# Patient Record
Sex: Male | Born: 1950 | Race: White | Hispanic: No | Marital: Married | Smoking: Never smoker
Health system: Southern US, Community
[De-identification: ages and names within clinical notes are randomized; demographics above are authoritative.]

## PROBLEM LIST (undated history)

## (undated) DIAGNOSIS — R188 Other ascites: Secondary | ICD-10-CM

## (undated) DIAGNOSIS — K922 Gastrointestinal hemorrhage, unspecified: Secondary | ICD-10-CM

## (undated) DIAGNOSIS — K7460 Unspecified cirrhosis of liver: Secondary | ICD-10-CM

## (undated) DIAGNOSIS — N179 Acute kidney failure, unspecified: Secondary | ICD-10-CM

## (undated) DIAGNOSIS — I509 Heart failure, unspecified: Secondary | ICD-10-CM

## (undated) DIAGNOSIS — K219 Gastro-esophageal reflux disease without esophagitis: Secondary | ICD-10-CM

## (undated) DIAGNOSIS — D696 Thrombocytopenia, unspecified: Secondary | ICD-10-CM

## (undated) DIAGNOSIS — E119 Type 2 diabetes mellitus without complications: Secondary | ICD-10-CM

## (undated) DIAGNOSIS — D649 Anemia, unspecified: Secondary | ICD-10-CM

## (undated) DIAGNOSIS — K766 Portal hypertension: Secondary | ICD-10-CM

## (undated) HISTORY — PX: CARDIAC CATHETERIZATION: SHX172

## (undated) HISTORY — DX: Heart failure, unspecified: I50.9

---

## 2012-09-20 ENCOUNTER — Emergency Department: Payer: Self-pay | Admitting: Emergency Medicine

## 2013-02-19 DIAGNOSIS — D649 Anemia, unspecified: Secondary | ICD-10-CM

## 2013-02-19 DIAGNOSIS — K922 Gastrointestinal hemorrhage, unspecified: Secondary | ICD-10-CM

## 2013-02-19 HISTORY — DX: Anemia, unspecified: D64.9

## 2013-02-19 HISTORY — DX: Gastrointestinal hemorrhage, unspecified: K92.2

## 2013-03-09 ENCOUNTER — Emergency Department (HOSPITAL_COMMUNITY): Payer: BC Managed Care – PPO

## 2013-03-09 ENCOUNTER — Inpatient Hospital Stay (HOSPITAL_COMMUNITY)
Admission: EM | Admit: 2013-03-09 | Discharge: 2013-03-13 | DRG: 432 | Disposition: A | Discharge status: Home Health | Payer: BC Managed Care – PPO | Attending: Internal Medicine | Admitting: Internal Medicine

## 2013-03-09 ENCOUNTER — Encounter (HOSPITAL_COMMUNITY): Payer: Self-pay | Admitting: Emergency Medicine

## 2013-03-09 DIAGNOSIS — E46 Unspecified protein-calorie malnutrition: Secondary | ICD-10-CM | POA: Diagnosis present

## 2013-03-09 DIAGNOSIS — D6959 Other secondary thrombocytopenia: Secondary | ICD-10-CM | POA: Diagnosis present

## 2013-03-09 DIAGNOSIS — K319 Disease of stomach and duodenum, unspecified: Secondary | ICD-10-CM | POA: Diagnosis present

## 2013-03-09 DIAGNOSIS — I8501 Esophageal varices with bleeding: Secondary | ICD-10-CM | POA: Diagnosis present

## 2013-03-09 DIAGNOSIS — K7460 Unspecified cirrhosis of liver: Secondary | ICD-10-CM | POA: Diagnosis present

## 2013-03-09 DIAGNOSIS — E872 Acidosis, unspecified: Secondary | ICD-10-CM | POA: Diagnosis present

## 2013-03-09 DIAGNOSIS — D689 Coagulation defect, unspecified: Secondary | ICD-10-CM | POA: Diagnosis present

## 2013-03-09 DIAGNOSIS — R7401 Elevation of levels of liver transaminase levels: Secondary | ICD-10-CM | POA: Diagnosis present

## 2013-03-09 DIAGNOSIS — D62 Acute posthemorrhagic anemia: Secondary | ICD-10-CM | POA: Diagnosis present

## 2013-03-09 DIAGNOSIS — I8511 Secondary esophageal varices with bleeding: Secondary | ICD-10-CM | POA: Diagnosis present

## 2013-03-09 DIAGNOSIS — D696 Thrombocytopenia, unspecified: Secondary | ICD-10-CM | POA: Diagnosis present

## 2013-03-09 DIAGNOSIS — Z87891 Personal history of nicotine dependence: Secondary | ICD-10-CM

## 2013-03-09 DIAGNOSIS — E119 Type 2 diabetes mellitus without complications: Secondary | ICD-10-CM | POA: Diagnosis present

## 2013-03-09 DIAGNOSIS — K922 Gastrointestinal hemorrhage, unspecified: Secondary | ICD-10-CM | POA: Diagnosis present

## 2013-03-09 DIAGNOSIS — Z79899 Other long term (current) drug therapy: Secondary | ICD-10-CM

## 2013-03-09 LAB — URINALYSIS, ROUTINE W REFLEX MICROSCOPIC
Hgb urine dipstick: NEGATIVE
Leukocytes, UA: NEGATIVE
Protein, ur: NEGATIVE mg/dL
Urobilinogen, UA: 0.2 mg/dL (ref 0.0–1.0)

## 2013-03-09 LAB — POCT I-STAT, CHEM 8
BUN: 37 mg/dL — ABNORMAL HIGH (ref 6–23)
Chloride: 108 mEq/L (ref 96–112)
Glucose, Bld: 200 mg/dL — ABNORMAL HIGH (ref 70–99)
HCT: 31 % — ABNORMAL LOW (ref 39.0–52.0)
Potassium: 5.3 mEq/L — ABNORMAL HIGH (ref 3.5–5.1)
Sodium: 141 mEq/L (ref 135–145)

## 2013-03-09 LAB — CBC WITH DIFFERENTIAL/PLATELET
Basophils Absolute: 0 10*3/uL (ref 0.0–0.1)
Basophils Relative: 0 % (ref 0–1)
Eosinophils Absolute: 0 10*3/uL (ref 0.0–0.7)
Eosinophils Relative: 0 % (ref 0–5)
HCT: 29.6 % — ABNORMAL LOW (ref 39.0–52.0)
Hemoglobin: 9.8 g/dL — ABNORMAL LOW (ref 13.0–17.0)
MCH: 32.7 pg (ref 26.0–34.0)
MCHC: 33.1 g/dL (ref 30.0–36.0)
MCV: 98.7 fL (ref 78.0–100.0)
Monocytes Absolute: 1.7 10*3/uL — ABNORMAL HIGH (ref 0.1–1.0)
Monocytes Relative: 13 % — ABNORMAL HIGH (ref 3–12)
Neutro Abs: 10.1 10*3/uL — ABNORMAL HIGH (ref 1.7–7.7)
Neutrophils Relative %: 79 % — ABNORMAL HIGH (ref 43–77)
RDW: 13.6 % (ref 11.5–15.5)

## 2013-03-09 LAB — OCCULT BLOOD, POC DEVICE: Fecal Occult Bld: POSITIVE — AB

## 2013-03-09 LAB — HEPATIC FUNCTION PANEL
ALT: 69 U/L — ABNORMAL HIGH (ref 0–53)
Albumin: 3 g/dL — ABNORMAL LOW (ref 3.5–5.2)
Indirect Bilirubin: 1.3 mg/dL — ABNORMAL HIGH (ref 0.3–0.9)
Total Protein: 6.3 g/dL (ref 6.0–8.3)

## 2013-03-09 LAB — PROTIME-INR: INR: 1.61 — ABNORMAL HIGH (ref 0.00–1.49)

## 2013-03-09 LAB — CG4 I-STAT (LACTIC ACID): Lactic Acid, Venous: 5.01 mmol/L — ABNORMAL HIGH (ref 0.5–2.2)

## 2013-03-09 MED ORDER — PANTOPRAZOLE SODIUM 40 MG IV SOLR
40.0000 mg | Freq: Once | INTRAVENOUS | Status: AC
  Administered 2013-03-09: 40 mg via INTRAVENOUS
  Filled 2013-03-09: qty 40

## 2013-03-09 MED ORDER — IOHEXOL 300 MG/ML  SOLN: 100.0000 mL | Freq: Once | INTRAMUSCULAR | Status: AC | PRN

## 2013-03-09 MED ORDER — PIPERACILLIN-TAZOBACTAM 3.375 G IVPB
3.3750 g | Freq: Once | INTRAVENOUS | Status: AC
  Administered 2013-03-09: 3.375 g via INTRAVENOUS
  Filled 2013-03-09: qty 50

## 2013-03-09 MED ORDER — FENTANYL CITRATE 0.05 MG/ML IJ SOLN
50.0000 ug | Freq: Once | INTRAMUSCULAR | Status: AC
  Administered 2013-03-09: 50 ug via INTRAVENOUS
  Filled 2013-03-09: qty 2

## 2013-03-09 MED ORDER — SODIUM CHLORIDE 0.9 % IV BOLUS (SEPSIS)
1000.0000 mL | Freq: Once | INTRAVENOUS | Status: AC
  Administered 2013-03-09: 1000 mL via INTRAVENOUS

## 2013-03-09 MED ORDER — IOHEXOL 300 MG/ML  SOLN
25.0000 mL | INTRAMUSCULAR | Status: AC
  Administered 2013-03-09: 25 mL via ORAL

## 2013-03-09 MED ORDER — SODIUM CHLORIDE 0.9 % IV BOLUS (SEPSIS): 1000.0000 mL | Freq: Once | INTRAVENOUS | Status: DC

## 2013-03-09 MED ORDER — FENTANYL CITRATE 0.05 MG/ML IJ SOLN
50.0000 ug | Freq: Once | INTRAMUSCULAR | Status: AC
  Administered 2013-03-10: 50 ug via INTRAVENOUS
  Filled 2013-03-09: qty 2

## 2013-03-09 MED ORDER — ONDANSETRON HCL 4 MG/2ML IJ SOLN
4.0000 mg | Freq: Once | INTRAMUSCULAR | Status: AC
  Administered 2013-03-09: 4 mg via INTRAVENOUS
  Filled 2013-03-09: qty 2

## 2013-03-09 NOTE — ED Notes (Signed)
Pt arrived to ED, started complaining about lower abdominal pain above pelvis. Pt states this is the first time he has had pain in his abdomen. Pt states that he had 2 episodes of dark stools this morning prior to work. Pt was not having any pain nor had he had dark stools prior to this morning. Pt states that he felt fine at work all day and this afternoon had one episode of bright red vomit pt still denied pain prior to of after vomiting. Pt denies passing out or LOC after vomiting. Pt arrived with bright red vomit on chin from earlier.

## 2013-03-09 NOTE — ED Notes (Signed)
Zosyn started after blood cultures were drawn. Phlebotomy did not click off until after medication charted.

## 2013-03-09 NOTE — ED Provider Notes (Signed)
CSN: [id number]     Arrival date & time 03/09/13  1908 History   First MD Initiated Contact with Patient 03/09/13 1910     Chief Complaint  Patient presents with  . GI Bleeding   (Consider location/radiation/quality/duration/timing/severity/associated sxs/prior Treatment) HPI Onset was this morning for black stools and just prior to arrival for blood in vomit. 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 Pt does not remember what happened.  The pain is moderately severe located to abdomen diffusely. Sharp, no radiation. Modifying factors: pain worse with palpation vomiting.  Associated symptoms: nause.  Recent medical care: none.   Past Medical History  Diagnosis Date  . Diabetes mellitus without complication    Past Surgical History  Procedure Laterality Date  . Esophagogastroduodenoscopy N/A 03/10/2013    Procedure: ESOPHAGOGASTRODUODENOSCOPY (EGD);  Surgeon: Rachael Fee, MD;  Location: [facility] ENDOSCOPY;  Service: Endoscopy;  Laterality: N/A;   History reviewed. No pertinent family history. History  Substance Use Topics  . Smoking status: Former Games developer  . Smokeless tobacco: Not on file  . Alcohol Use: No    Review of Systems Constitutional: Negative for fever.  Eyes: Negative for vision loss.  ENT: Negative for difficulty swallowing.  Cardiovascular: Negative for chest pain. Respiratory: Negative for respiratory distress.  Gastrointestinal:  Positive for vomiting.  Genitourinary: Negative for inability to void.  Musculoskeletal: Negative for gait problem.  Integumentary: Negative for rash.  Neurological: Negative for new focal weakness.     Allergies  Review of patient's allergies indicates no known allergies.  Home Medications  No current outpatient prescriptions on file. BP 110/64  Pulse 87  Temp(Src) 99.5 F (37.5 C) (Oral)  Resp 22  Ht 5\' 6"  (1.676 m)  Wt 182 lb 15.7 oz (83.001 kg)  BMI 29.55 kg/m2   SpO2 96% Physical Exam Nursing note and vitals reviewed.  Constitutional: Pt is alert and appears stated age. Eyes: No injection, no scleral icterus. HENT: Atraumatic, airway open without erythema or exudate.  Respiratory: No respiratory distress. Equal breathing bilaterally. Cardiovascular: Normal rate. Extremities warm and well perfused.  Abdomen: Soft, slight distention, diffuse tenderness, mild guarding, no rebound. MSK: Extremities are atraumatic without deformity. Skin: No rash, no wounds.   Neuro: No motor nor sensory deficit. GCS 15.  GU: Normal rectum, black stool.      ED Course  Procedures (including critical care time) Labs Review Labs Reviewed  CBC WITH DIFFERENTIAL - Abnormal; Notable for the following:    WBC 12.8 (*)    RBC 3.00 (*)    Hemoglobin 9.8 (*)    HCT 29.6 (*)    Platelets 131 (*)    Neutrophils Relative % 79 (*)    Neutro Abs 10.1 (*)    Lymphocytes Relative 8 (*)    Monocytes Relative 13 (*)    Monocytes Absolute 1.7 (*)    All other components within normal limits  PROTIME-INR - Abnormal; Notable for the following:    Prothrombin Time 18.7 (*)    INR 1.61 (*)    All other components within normal limits  HEPATIC FUNCTION PANEL - Abnormal; Notable for the following:    Albumin 3.0 (*)    AST 65 (*)    ALT 69 (*)    Total Bilirubin 1.8 (*)    Bilirubin, Direct 0.5 (*)    Indirect Bilirubin 1.3 (*)    All other components within normal limits  URINALYSIS, ROUTINE  W REFLEX MICROSCOPIC - Abnormal; Notable for the following:    APPearance CLOUDY (*)    Glucose, UA 250 (*)    Ketones, ur 15 (*)    All other components within normal limits  CBC WITH DIFFERENTIAL - Abnormal; Notable for the following:    WBC 11.6 (*)    RBC 2.47 (*)    Hemoglobin 8.2 (*)    HCT 23.8 (*)    Platelets 107 (*)    Neutro Abs 8.1 (*)    Monocytes Relative 13 (*)    Monocytes Absolute 1.5 (*)    All other components within normal limits  GLUCOSE, CAPILLARY -  Abnormal; Notable for the following:    Glucose-Capillary 181 (*)    All other components within normal limits  CBC - Abnormal; Notable for the following:    RBC 2.55 (*)    Hemoglobin 8.4 (*)    HCT 24.7 (*)    Platelets 103 (*)    All other components within normal limits  COMPREHENSIVE METABOLIC PANEL - Abnormal; Notable for the following:    Glucose, Bld 207 (*)    BUN 31 (*)    Total Protein 5.8 (*)    Albumin 2.7 (*)    AST 147 (*)    ALT 151 (*)    All other components within normal limits  PROTIME-INR - Abnormal; Notable for the following:    Prothrombin Time 18.2 (*)    INR 1.55 (*)    All other components within normal limits  GLUCOSE, CAPILLARY - Abnormal; Notable for the following:    Glucose-Capillary 183 (*)    All other components within normal limits  HEMOGLOBIN AND HEMATOCRIT, BLOOD - Abnormal; Notable for the following:    Hemoglobin 9.7 (*)    HCT 27.8 (*)    All other components within normal limits  GLUCOSE, CAPILLARY - Abnormal; Notable for the following:    Glucose-Capillary 214 (*)    All other components within normal limits  HEMOGLOBIN AND HEMATOCRIT, BLOOD - Abnormal; Notable for the following:    Hemoglobin 9.7 (*)    HCT 28.0 (*)    All other components within normal limits  COMPREHENSIVE METABOLIC PANEL - Abnormal; Notable for the following:    Glucose, Bld 215 (*)    Total Protein 5.8 (*)    Albumin 2.9 (*)    AST 179 (*)    ALT 219 (*)    All other components within normal limits  PROTIME-INR - Abnormal; Notable for the following:    Prothrombin Time 17.9 (*)    INR 1.52 (*)    All other components within normal limits  CBC - Abnormal; Notable for the following:    RBC 2.98 (*)    Hemoglobin 9.8 (*)    HCT 27.7 (*)    Platelets 66 (*)    All other components within normal limits  GLUCOSE, CAPILLARY - Abnormal; Notable for the following:    Glucose-Capillary 225 (*)    All other components within normal limits  GLUCOSE, CAPILLARY -  Abnormal; Notable for the following:    Glucose-Capillary 197 (*)    All other components within normal limits  GLUCOSE, CAPILLARY - Abnormal; Notable for the following:    Glucose-Capillary 205 (*)    All other components within normal limits  GLUCOSE, CAPILLARY - Abnormal; Notable for the following:    Glucose-Capillary 193 (*)    All other components within normal limits  GLUCOSE, CAPILLARY - Abnormal; Notable for the  following:    Glucose-Capillary 210 (*)    All other components within normal limits  OCCULT BLOOD, POC DEVICE - Abnormal; Notable for the following:    Fecal Occult Bld POSITIVE (*)    All other components within normal limits  POCT I-STAT, CHEM 8 - Abnormal; Notable for the following:    Potassium 5.3 (*)    BUN 37 (*)    Glucose, Bld 200 (*)    Hemoglobin 10.5 (*)    HCT 31.0 (*)    All other components within normal limits  CG4 I-STAT (LACTIC ACID) - Abnormal; Notable for the following:    Lactic Acid, Venous 5.01 (*)    All other components within normal limits  CG4 I-STAT (LACTIC ACID) - Abnormal; Notable for the following:    Lactic Acid, Venous 3.53 (*)    All other components within normal limits  CULTURE, BLOOD (ROUTINE X 2)  CULTURE, BLOOD (ROUTINE X 2)  URINE CULTURE  MRSA PCR SCREENING  HEPATITIS PANEL, ACUTE  AFP TUMOR MARKER  AMMONIA  CBC  LIPID PANEL  TYPE AND SCREEN  ABO/RH  PREPARE RBC (CROSSMATCH)   Imaging Review Ct Abdomen Pelvis W Contrast  03/09/2013   CLINICAL DATA:  Gastrointestinal bleeding.  Diabetes.  EXAM: CT ABDOMEN AND PELVIS WITH CONTRAST  TECHNIQUE: Multidetector CT imaging of the abdomen and pelvis was performed using the standard protocol following bolus administration of intravenous contrast.  CONTRAST:  100 cc Omnipaque 300  COMPARISON:  None.  FINDINGS: Is distal esophageal contrast medium suggest gastroesophageal reflux. Borderline distal esophageal wall thickening.  Numerous small solid-appearing lesions are  scattered throughout the liver. Prominently nodular hepatic contour compatible with cirrhosis. Upper normal spleen size. Portal vein and splenic vein patent. Scattered small varices along the gastrohepatic ligament and tracking adjacent to the distal esophagus.  Pancreas and adrenal glands unremarkable. Kidneys and proximal ureters unremarkable.  Small retroperitoneal lymph nodes are not pathologically enlarged by size criteria. A portacaval node measures 1.2 cm in short axis. No pathologic pelvic adenopathy. Moderately prominent prostate gland at 5.3 x 3.5 cm.  Possible wall thickening in the rectum on images 71 through 79 of series 2.  Bridging spurring of the sacroiliac joints.  Lumbar spondylosis and degenerative disc disease.  IMPRESSION: 1. Prominent hepatic cirrhosis, with scattered complex lesions throughout the liver. Malignancy is not excluded. I recommend a follow-up non urgent hepatic protocol MRI for further characterization of these scattered liver lesions. 2. Mild wall thickening in the rectum may be incidental, but rectal tumor is not readily excluded. 3. Atherosclerosis. 4. Portal venous hypertension. 5. Gastroesophageal reflux. 6. Mildly prominent prostate gland. 7. Lumbar spondylosis and degenerative disc disease.   Electronically Signed   By: Herbie Baltimore M.D.   On: 03/09/2013 23:22   Dg Chest Portable 1 View  03/09/2013   CLINICAL DATA:  Spitting up blood, GI bleed  EXAM: PORTABLE CHEST - 1 VIEW  COMPARISON:  None.  FINDINGS: Borderline enlarged cardiac silhouette and mediastinal contours, possibly attributable to projection and decreased lung volumes. There is mild diffuse slightly nodular thickening of the pulmonary interstitium. A punctate granuloma overlies the peripheral aspect of the right upper long. Minimal left basilar opacities favored to represent atelectasis. No discrete focal airspace opacities. No definite pleural effusion or pneumothorax. No evidence of edema. Suspected  old/healed left-sided lateral sixth rib fracture.  IMPRESSION: Bronchitic change and left basilar atelectasis without definite acute cardiopulmonary disease on this AP portable examination. Further evaluation with a PA and  lateral chest radiograph may be obtained as clinically indicated.   Electronically Signed   By: Simonne Come M.D.   On: 03/09/2013 19:42    EKG Interpretation    Date/Time:  Wednesday March 09 2013 19:43:54 EST Ventricular Rate:  95 PR Interval:  128 QRS Duration: 80 QT Interval:  358 QTC Calculation: 450 R Axis:   -23 Text Interpretation:  Sinus rhythm Borderline left axis deviation Abnormal R-wave progression, early transition Borderline T abnormalities, inferior leads Baseline wander in lead(s) V1 V2 ED PHYSICIAN INTERPRETATION AVAILABLE IN [facility] Confirmed by TEST, RECORD ([id number]) on 03/11/2013 9:13:31 AM            MDM   1. GI bleed   2. Cirrhosis of liver without mention of alcohol   3. Esophageal varices with bleeding   4. Acute upper GI bleed   5. Thrombocytopenia   6. Acute blood loss anemia    62 y.o. male w/ PMHx of DM presents w/ GI bleed, abdominal tenderness. Arrived afebrile, normal vitals. Tender abdomen, black stool. Pt given IVF, IV fentanyl, IV zofran, IV protonix. EKG without signs of acute ischemia. Portable chest without free air. iStat lactate elevated at 5. CBC with leukocytosis, hgb <10. HR >100. Additional IVF ordered along with IV zosyn. Normal kidney function. CT scan with contrast ordered.   CT scan with hepatic cirrhosis, complex lesions, possible malignancy, rectal wall thickening. NG tube placed with 200 cc dark bloody output. On re-eval pt with resolved tachycardia, MAP >65. Lactic acid improving. Dr. Manus Gunning called GI consult. They are aware of the patient and will evaluate in the AM. Medicine called for admission to step down.    I independently viewed, interpreted, and used in my medical decision making all ordered  lab and imaging tests. Medical Decision Making discussed with ED attending Dr. Manus Gunning.      Charm Barges, MD 03/11/13 [phone]

## 2013-03-09 NOTE — ED Notes (Signed)
CT called. Pt feeling very nauseated after drinking half the CT water. EDP aware and states to do the IV contrast and not worry about the rest of the CT water. CT called and aware.

## 2013-03-09 NOTE — ED Notes (Signed)
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 Pt has no indication that he was going to get sick, but all of a sudden vomited of dark blood with bright red blood at work. Pt was hypotensive upon EMS arrival and was 84/54 initally given of NS and pressure increased along with trendelenburg 100/60 manual. Pt denied pain with EMS and denied nausea.

## 2013-03-09 NOTE — ED Notes (Signed)
Pt attempting to drink CT water. Pt states that he is starting to feel sick after.

## 2013-03-09 NOTE — ED Provider Notes (Signed)
Date: 03/09/2013  Rate: 95  Rhythm: normal sinus rhythm  QRS Axis: normal  Intervals: normal  ST/T Wave abnormalities: normal  Conduction Disutrbances:none  Narrative Interpretation:   Old EKG Reviewed: none available    Glynn Octave, MD 03/09/13 1958

## 2013-03-09 NOTE — ED Notes (Signed)
Sister, Pati Gallo [phone]

## 2013-03-09 NOTE — ED Notes (Signed)
phlebotomy at bedside to draw blood cultures.

## 2013-03-10 ENCOUNTER — Encounter (HOSPITAL_COMMUNITY): Payer: Self-pay

## 2013-03-10 ENCOUNTER — Encounter (HOSPITAL_COMMUNITY)
Admission: EM | Disposition: A | Discharge status: Home Health | Payer: Self-pay | Source: Home / Self Care | Attending: Internal Medicine

## 2013-03-10 DIAGNOSIS — E872 Acidosis, unspecified: Secondary | ICD-10-CM | POA: Diagnosis present

## 2013-03-10 DIAGNOSIS — K922 Gastrointestinal hemorrhage, unspecified: Secondary | ICD-10-CM

## 2013-03-10 DIAGNOSIS — K7460 Unspecified cirrhosis of liver: Secondary | ICD-10-CM

## 2013-03-10 DIAGNOSIS — D696 Thrombocytopenia, unspecified: Secondary | ICD-10-CM | POA: Diagnosis present

## 2013-03-10 DIAGNOSIS — E119 Type 2 diabetes mellitus without complications: Secondary | ICD-10-CM | POA: Diagnosis present

## 2013-03-10 DIAGNOSIS — D62 Acute posthemorrhagic anemia: Secondary | ICD-10-CM

## 2013-03-10 DIAGNOSIS — R740 Nonspecific elevation of levels of transaminase and lactic acid dehydrogenase [LDH]: Secondary | ICD-10-CM

## 2013-03-10 DIAGNOSIS — R7401 Elevation of levels of liver transaminase levels: Secondary | ICD-10-CM | POA: Diagnosis present

## 2013-03-10 DIAGNOSIS — I8501 Esophageal varices with bleeding: Secondary | ICD-10-CM

## 2013-03-10 HISTORY — PX: ESOPHAGOGASTRODUODENOSCOPY: SHX5428

## 2013-03-10 LAB — CBC WITH DIFFERENTIAL/PLATELET
Eosinophils Absolute: 0 10*3/uL (ref 0.0–0.7)
Eosinophils Relative: 0 % (ref 0–5)
HCT: 23.8 % — ABNORMAL LOW (ref 39.0–52.0)
Hemoglobin: 8.2 g/dL — ABNORMAL LOW (ref 13.0–17.0)
Lymphs Abs: 2 10*3/uL (ref 0.7–4.0)
MCH: 33.2 pg (ref 26.0–34.0)
MCV: 96.4 fL (ref 78.0–100.0)
Monocytes Absolute: 1.5 10*3/uL — ABNORMAL HIGH (ref 0.1–1.0)
Monocytes Relative: 13 % — ABNORMAL HIGH (ref 3–12)
Platelets: 107 10*3/uL — ABNORMAL LOW (ref 150–400)
RBC: 2.47 MIL/uL — ABNORMAL LOW (ref 4.22–5.81)

## 2013-03-10 LAB — HEPATITIS PANEL, ACUTE
HCV Ab: NEGATIVE
Hep B C IgM: NONREACTIVE

## 2013-03-10 LAB — CBC
HCT: 24.7 % — ABNORMAL LOW (ref 39.0–52.0)
Hemoglobin: 8.4 g/dL — ABNORMAL LOW (ref 13.0–17.0)
MCH: 32.9 pg (ref 26.0–34.0)
MCHC: 34 g/dL (ref 30.0–36.0)
Platelets: 103 10*3/uL — ABNORMAL LOW (ref 150–400)
RBC: 2.55 MIL/uL — ABNORMAL LOW (ref 4.22–5.81)

## 2013-03-10 LAB — GLUCOSE, CAPILLARY
Glucose-Capillary: 181 mg/dL — ABNORMAL HIGH (ref 70–99)
Glucose-Capillary: 183 mg/dL — ABNORMAL HIGH (ref 70–99)
Glucose-Capillary: 214 mg/dL — ABNORMAL HIGH (ref 70–99)

## 2013-03-10 LAB — COMPREHENSIVE METABOLIC PANEL
ALT: 151 U/L — ABNORMAL HIGH (ref 0–53)
AST: 147 U/L — ABNORMAL HIGH (ref 0–37)
Alkaline Phosphatase: 57 U/L (ref 39–117)
BUN: 31 mg/dL — ABNORMAL HIGH (ref 6–23)
CO2: 22 mEq/L (ref 19–32)
Calcium: 8.7 mg/dL (ref 8.4–10.5)
Chloride: 107 mEq/L (ref 96–112)
GFR calc Af Amer: >90 mL/min (ref >90)
GFR calc non Af Amer: >90 mL/min (ref >90)
Glucose, Bld: 207 mg/dL — ABNORMAL HIGH (ref 70–99)
Total Bilirubin: 0.9 mg/dL (ref 0.3–1.2)
Total Protein: 5.8 g/dL — ABNORMAL LOW (ref 6.0–8.3)

## 2013-03-10 LAB — PROTIME-INR: Prothrombin Time: 18.2 seconds — ABNORMAL HIGH (ref 11.6–15.2)

## 2013-03-10 LAB — PREPARE RBC (CROSSMATCH)

## 2013-03-10 LAB — CG4 I-STAT (LACTIC ACID): Lactic Acid, Venous: 3.53 mmol/L — ABNORMAL HIGH (ref 0.5–2.2)

## 2013-03-10 SURGERY — EGD (ESOPHAGOGASTRODUODENOSCOPY)
Anesthesia: Moderate Sedation

## 2013-03-10 MED ORDER — INSULIN ASPART 100 UNIT/ML ~~LOC~~ SOLN
0.0000 [IU] | Freq: Four times a day (QID) | SUBCUTANEOUS | Status: DC
  Administered 2013-03-10: 3 [IU] via SUBCUTANEOUS
  Administered 2013-03-10: 2 [IU] via SUBCUTANEOUS
  Administered 2013-03-11 (×3): 3 [IU] via SUBCUTANEOUS
  Administered 2013-03-11: 2 [IU] via SUBCUTANEOUS

## 2013-03-10 MED ORDER — MIDAZOLAM HCL 10 MG/2ML IJ SOLN
INTRAMUSCULAR | Status: DC | PRN
  Administered 2013-03-10: 1 mg via INTRAVENOUS
  Administered 2013-03-10: 2 mg via INTRAVENOUS

## 2013-03-10 MED ORDER — PANTOPRAZOLE SODIUM 40 MG IV SOLR: 40.0000 mg | Freq: Two times a day (BID) | INTRAVENOUS | Status: DC

## 2013-03-10 MED ORDER — PANTOPRAZOLE SODIUM 40 MG IV SOLR
40.0000 mg | Freq: Two times a day (BID) | INTRAVENOUS | Status: DC
  Administered 2013-03-10: 40 mg via INTRAVENOUS
  Filled 2013-03-10 (×3): qty 40

## 2013-03-10 MED ORDER — VITAMIN K1 10 MG/ML IJ SOLN
5.0000 mg | Freq: Once | INTRAVENOUS | Status: AC
  Administered 2013-03-10: 5 mg via INTRAVENOUS
  Filled 2013-03-10 (×2): qty 0.5

## 2013-03-10 MED ORDER — FENTANYL CITRATE 0.05 MG/ML IJ SOLN
INTRAMUSCULAR | Status: DC | PRN
  Administered 2013-03-10: 12.5 ug via INTRAVENOUS
  Administered 2013-03-10: 25 ug via INTRAVENOUS

## 2013-03-10 MED ORDER — SODIUM CHLORIDE 0.9 % IV SOLN: 8.0000 mg/h | INTRAVENOUS | Status: DC

## 2013-03-10 MED ORDER — DEXTROSE 5 % IV SOLN
2.0000 g | Freq: Every day | INTRAVENOUS | Status: DC
  Administered 2013-03-10: 2 g via INTRAVENOUS
  Filled 2013-03-10 (×2): qty 2

## 2013-03-10 MED ORDER — SODIUM CHLORIDE 0.9 % IV SOLN
80.0000 mg | Freq: Once | INTRAVENOUS | Status: AC
  Administered 2013-03-10: 03:00:00 80 mg via INTRAVENOUS
  Filled 2013-03-10: qty 80

## 2013-03-10 MED ORDER — INSULIN ASPART 100 UNIT/ML ~~LOC~~ SOLN: 0.0000 [IU] | Freq: Every day | SUBCUTANEOUS | Status: DC

## 2013-03-10 MED ORDER — ONDANSETRON HCL 4 MG/2ML IJ SOLN: 4.0000 mg | Freq: Four times a day (QID) | INTRAMUSCULAR | Status: DC | PRN

## 2013-03-10 MED ORDER — SODIUM CHLORIDE 0.9 % IV SOLN
50.0000 ug/h | INTRAVENOUS | Status: DC
  Administered 2013-03-10 – 2013-03-12 (×5): 50 ug/h via INTRAVENOUS
  Filled 2013-03-10 (×9): qty 1

## 2013-03-10 MED ORDER — FENTANYL CITRATE 0.05 MG/ML IJ SOLN
INTRAMUSCULAR | Status: AC
  Filled 2013-03-10: qty 2

## 2013-03-10 MED ORDER — SODIUM CHLORIDE 0.9 % IV SOLN
8.0000 mg/h | INTRAVENOUS | Status: DC
  Administered 2013-03-10: 8 mg/h via INTRAVENOUS
  Filled 2013-03-10 (×3): qty 80

## 2013-03-10 MED ORDER — MIDAZOLAM HCL 5 MG/ML IJ SOLN
INTRAMUSCULAR | Status: AC
  Filled 2013-03-10: qty 2

## 2013-03-10 MED ORDER — SODIUM CHLORIDE 0.9 % IV SOLN
INTRAVENOUS | Status: DC
  Administered 2013-03-10 – 2013-03-11 (×3): via INTRAVENOUS

## 2013-03-10 MED ORDER — SODIUM CHLORIDE 0.9 % IV SOLN: 25.0000 ug/h | INTRAVENOUS | Status: DC

## 2013-03-10 MED ORDER — ONDANSETRON HCL 4 MG PO TABS: 4.0000 mg | ORAL_TABLET | Freq: Four times a day (QID) | ORAL | Status: DC | PRN

## 2013-03-10 MED ORDER — OCTREOTIDE LOAD VIA INFUSION
50.0000 ug | Freq: Once | INTRAVENOUS | Status: AC
  Administered 2013-03-10: 50 ug via INTRAVENOUS
  Filled 2013-03-10: qty 25

## 2013-03-10 MED ORDER — SODIUM CHLORIDE 0.9 % IJ SOLN
3.0000 mL | Freq: Two times a day (BID) | INTRAMUSCULAR | Status: DC
  Administered 2013-03-10 – 2013-03-13 (×4): 3 mL via INTRAVENOUS

## 2013-03-10 MED ORDER — VITAMIN K1 10 MG/ML IJ SOLN
5.0000 mg | Freq: Once | INTRAVENOUS | Status: AC
  Administered 2013-03-10: 5 mg via INTRAVENOUS
  Filled 2013-03-10: qty 0.5

## 2013-03-10 MED ORDER — BUTAMBEN-TETRACAINE-BENZOCAINE 2-2-14 % EX AERO
INHALATION_SPRAY | CUTANEOUS | Status: DC | PRN
  Administered 2013-03-10: 2 via TOPICAL

## 2013-03-10 NOTE — ED Notes (Signed)
Admitting at bedside 

## 2013-03-10 NOTE — ED Notes (Signed)
Lactic Acid reported to Dr.Rancour

## 2013-03-10 NOTE — ED Notes (Signed)
Pt states that he thought that he had to have gas and he did have dark stools. Admitting MD at bedside still talking with patient, Admitting MD spoke with GI MD about NG tube and GI MD told Admitting MD that NG tube was not necessary.

## 2013-03-10 NOTE — H&P (View-Only) (Signed)
[facility] Consult: 8:30 AM 03/10/2013  LOS: 1 day    Referring Provider: Dr Pranav Patel Primary Care Physician:  Dr Elkins in [place] Primary Gastroenterologist:  unassigned    Reason for Consultation:  Melena and hematemesis   HPI: Justin Scott is a 61 y.o. male.  Working diabetic with no previous other medical problems or liver disease.  More easily fatigued in last few days.  Yesterday passed melenic type stools in AM and again in afternoon.  vomitted blood in afternoon.   At ED Hgb was 9.8 but today is 8.2.  Had another melenic stool this AM. Prolonged coags, increased BUN and mildly abnormal LFTs. A CT scan reveals cirrhosis and nodularity that may suggest metastatic disease.  Rectum is mildly thickened.  CT suggests esophageal varices.  Started on PPI and octreotide drip as well as Rocephin.      Past Medical History  Diagnosis Date  . Diabetes mellitus without complication     History reviewed. No pertinent past surgical history.  Prior to Admission medications   Medication Sig Start Date End Date Taking? Authorizing Provider  metFORMIN (GLUCOPHAGE) 1000 MG tablet Take 500-1,000 mg by mouth 2 (two) times daily. Takes 500mg in morning and 1000mg in evening   Yes Historical Provider, MD    Scheduled Meds: . cefTRIAXone (ROCEPHIN)  IV  2 g Intravenous QHS  . insulin aspart  0-5 Units Subcutaneous QHS  . insulin aspart  0-9 Units Subcutaneous Q6H  . [START ON 03/13/2013] pantoprazole (PROTONIX) IV  40 mg Intravenous Q12H  . sodium chloride  1,000 mL Intravenous Once  . sodium chloride  1,000 mL Intravenous Once  . sodium chloride  3 mL Intravenous Q12H   Infusions: . sodium chloride 100 mL/hr at 03/10/13 0325  . octreotide (SANDOSTATIN) infusion 50 mcg/hr (03/10/13 0529)  . pantoprozole (PROTONIX) infusion 8 mg/hr (03/10/13 0359)   PRN Meds: ondansetron (ZOFRAN) IV, ondansetron   Allergies as of  03/09/2013  . (No Known Allergies)    History reviewed. No pertinent family history.  History   Social History  . Marital Status: Married    Spouse Name: N/A    Number of Children: N/A  . Years of Education: N/A   Occupational History  . Janitor at [employer]   Social History Main Topics  . Smoking status: Former Smoker  . Smokeless tobacco: Not on file  . Alcohol Use: No  . Drug Use: No  . Sexual Activity: Not on file   Other Topics Concern  . Wife is ill and lives in a rest home   Social History Narrative  . Lives alone in [place]    REVIEW OF SYSTEMS: Constitutional:  No weight loss ENT:  No nose bleeds Pulm:  No cough or SOB CV:  No chest pain or palps GU:  No dyuria, frequency or blood in urine.   GI:  Per HPI Heme:  No hx low blood counts.    Transfusions:  none Neuro:  No headaches, no presyncope or fainting Derm:  No rash , no sores, no itching Endocrine:  No excessive thirst.  + sweats when having spells of weakness and stools yest and today Immunization:  Flu shot in Sept 2014 Travel:  none   PHYSICAL EXAM: Vital signs in last 24 hours: Filed Vitals:   03/10/13 0806  BP: 98/42  Pulse: 101  Temp: 98.7 F (37.1 C)  Resp: 19   Wt Readings from Last 3 Encounters:  03/10/13 83.001 kg (  182 lb 15.7 oz)    General: Pleasant, alert, pale, comfortable. Looks slightly acutely ill and slightly diaphoretic Head:  No trauma or asymmetry,.  No swelling  Eyes:  No icterus or pallor.  EOMI Ears:  HOH  Nose:  No discharge or bleeding Mouth:  Clear, moist, no blood or sores Neck:  No JVD, no TMG, no bruits Lungs:  Clear bil.  Not SOB or coughing Heart: RRR.  No MRG Abdomen:  Soft, NT, ND, active BS.  No mass or HSM.  No bruits.   Rectal: melenic stool in bed pain.  No digital axam   Musc/Skeltl: no joint deformity or swelling Extremities:  No CCE  Neurologic:  Pleasant, oriented x 3.  No tremor, no asterixis.  Moves all 4 limbs with full  strength Skin:  No telangectasia on face or trunk.  No sores, no rash Tattoos:  none Nodes:  No cervical adenopathy   Psych:  Pleasant, relaxed, appropriate questions  Intake/Output from previous day: 11/19 0701 - 11/20 0700 In: 2445.8 [I.V.:2345.8; IV Piggyback:100] Out: 800 [Urine:800] Intake/Output this shift: Total I/O In: -  Out: 300 [Urine:300]  LAB RESULTS:  Recent Labs  03/09/13 2022 03/09/13 2039 03/10/13 0345  WBC 12.8*  --  11.6*  HGB 9.8* 10.5* 8.2*  HCT 29.6* 31.0* 23.8*  PLT 131*  --  107*  MCV     98  BMET Lab Results  Component Value Date   NA 141 03/09/2013   K 5.3* 03/09/2013   CL 108 03/09/2013   GLUCOSE 200* 03/09/2013   BUN 37* 03/09/2013   CREATININE 1.00 03/09/2013   LFT  Recent Labs  03/09/13 2022  PROT 6.3  ALBUMIN 3.0*  AST 65*  ALT 69*  ALKPHOS 81  BILITOT 1.8*  BILIDIR 0.5*  IBILI 1.3*   PT/INR Lab Results  Component Value Date   INR 1.61* 03/09/2013       Protime                  18.7  Hepatitis Panel No results found for this basename: HEPBSAG, HCVAB, HEPAIGM, HEPBIGM,  in the last 72 hours  Drugs of Abuse  No results found for this basename: labopia, cocainscrnur, labbenz, amphetmu, thcu, labbarb     RADIOLOGY STUDIES: Ct Abdomen Pelvis W Contrast 03/09/2013   FINDINGS: Is distal esophageal contrast medium suggest gastroesophageal reflux. Borderline distal esophageal wall thickening.  Numerous small solid-appearing lesions are scattered throughout the liver. Prominently nodular hepatic contour compatible with cirrhosis. Upper normal spleen size. Portal vein and splenic vein patent. Scattered small varices along the gastrohepatic ligament and tracking adjacent to the distal esophagus.  Pancreas and adrenal glands unremarkable. Kidneys and proximal ureters unremarkable.  Small retroperitoneal lymph nodes are not pathologically enlarged by size criteria. A portacaval node measures 1.2 cm in short axis. No pathologic pelvic  adenopathy. Moderately prominent prostate gland at 5.3 x 3.5 cm.  Possible wall thickening in the rectum on images 71 through 79 of series 2.  Bridging spurring of the sacroiliac joints.  Lumbar spondylosis and degenerative disc disease.  IMPRESSION: 1. Prominent hepatic cirrhosis, with scattered complex lesions throughout the liver. Malignancy is not excluded. I recommend a follow-up non urgent hepatic protocol MRI for further characterization of these scattered liver lesions. 2. Mild wall thickening in the rectum may be incidental, but rectal tumor is not readily excluded. 3. Atherosclerosis. 4. Portal venous hypertension. 5. Gastroesophageal reflux. 6. Mildly prominent prostate gland. 7. Lumbar spondylosis and degenerative disc   disease.   Electronically Signed   By: Walt  Liebkemann M.D.   On: 03/09/2013 23:22   Dg Chest Portable 1 View 03/09/2013   CLINICAL DATA:  Spitting up blood, GI bleed  EXAM: PORTABLE CHEST - 1 VIEW  COMPARISON:  None.  FINDINGS: Borderline enlarged cardiac silhouette and mediastinal contours, possibly attributable to projection and decreased lung volumes. There is mild diffuse slightly nodular thickening of the pulmonary interstitium. A punctate granuloma overlies the peripheral aspect of the right upper long. Minimal left basilar opacities favored to represent atelectasis. No discrete focal airspace opacities. No definite pleural effusion or pneumothorax. No evidence of edema. Suspected old/healed left-sided lateral sixth rib fracture.  IMPRESSION: Bronchitic change and left basilar atelectasis without definite acute cardiopulmonary disease on this AP portable examination. Further evaluation with a PA and lateral chest radiograph may be obtained as clinically indicated.   Electronically Signed   By: John  Watts M.D.   On: 03/09/2013 19:42    ENDOSCOPIC STUDIES: None ever  IMPRESSION:   *  Upper GI bleed.  Rule out esophageal variceal bleed vs PUD.  Esophageal varices suggested  by CT . On octreotide and ppi drip.   *  Cirrhosis of liver, new diagnoses.  Ct scan with complex lesions, unable to discern if malignancies or not. Pt is not a drinker and never has consumed ETOH  *  Thickened rectal wall on CT, rule out cancer. Will need lower GI eval in future, probably as outpt.   *  Intermittent solid food dysphagia.  Rule out esophageal stricture vs dysmotility.   *  Coagulopathy.   *  Normocytic anemia  *  Thrombocytopenia.   *  DM2.   *  Azotemia.     PLAN:     *  EGD today.  D/W pt.and Dr Rilen Shukla.    Sarah Gribbin  03/10/2013, 8:30 AM Pager: [phone]     ________________________________________________________________________  [facility] MD note:  I personally examined the patient, reviewed the data and agree with the assessment and plan described above.  Planning on EGD this morning for GI bleeding, likely upper.   Linken Mcglothen, MD  Gastroenterology Pager [phone]  

## 2013-03-10 NOTE — Interval H&P Note (Signed)
History and Physical Interval Note:  03/10/2013 10:20 AM  Justin Scott  has presented today for surgery, with the diagnosis of gi bleed  The various methods of treatment have been discussed with the patient and family. After consideration of risks, benefits and other options for treatment, the patient has consented to  Procedure(s): ESOPHAGOGASTRODUODENOSCOPY (EGD) (N/A) as a surgical intervention .  The patient's history has been reviewed, patient examined, no change in status, stable for surgery.  I have reviewed the patient's chart and labs.  Questions were answered to the patient's satisfaction.     Rachael Fee

## 2013-03-10 NOTE — Progress Notes (Signed)
[facility] Progress Note [place] TEAM 1 - [facility] ICU Team   Justin Scott ZOX:[id number] DOB: Feb 21, 1951 DOA: 03/09/2013 PCP: No primary provider on file.  Brief narrative: 62 year old male patient with only known past medical history of diabetes. Patient lives at home. For 2 days prior to arrival he been having black colored stools as well as an episode of melanotic vomiting. This was also associated with left lower quadrant abdominal pain. On the day of admission he was found on the floor of a local gymnasium after vomiting blood. He complained of dizziness after arrival to the emergency department but no other symptoms. Denies significant alcohol abuse, blood transfusions or changes in medications. In the ER he appeared very dehydrated with potassium of 5.3 and a lactic acid of 5.0, mild transaminitis, leukocytosis. White count was 12,800 and platelet count was 131,000. No previous labs to compare. In the ER his blood pressure was mostly in the low 100s but no apparent hypotension.  Assessment/Plan: Active Problems:   Acute upper GI bleed/Esophageal varices with bleeding -GI following -EGD 11/20 revealed portal gastropathy and varices -cont Octreotide gtt cont for add'l 24-36 hours -GI starting clears -cont PPI    Acute blood loss anemia -hgb increased briefly after 1 unit PRBCs-hgb drifted down after hydration so GI has ordered 2 more PRBCs -follow CBC    Cirrhosis of liver without mention of alcohol/Transaminitis -pt denies ETOH use -? NASH -check AFP and acute hepatitis panel -check ammonia level    Lactic acidosis -from acute bleeding -was on Metformin pre admit but renal function is normal    Diabetes mellitus, type II -holding Metformin -cont SSI    Thrombocytopenia -due to cirrhosis and consumption for ABL anemia    Possible rectal wall thickening -seen on CT-defer to GI  Elevated INR - vit K dose today and recheck in AM  DVT prophylaxis:  SCDs Code Status: Full Family Communication: with patient's wife Disposition Plan/Expected LOS: Remain in step down Isolation: None Nutritional Status: Acute protein calorie malnutrition related to upper GI bleeding  Consultants: Gastroenterology  Procedures: EGD  11/20 There were three trunks of small to medium sized distal esophagus  varices. None had clear signs of recent bleeding however there was  hematin in stomach was well as a small amount of fresh red blood at  GE junction. There was moderate portal gastropathy throughout the  stomach. I placed 7 variceal ligating bands (one misfired) without  immediate complication. The examination was otherwise normal.  Antibiotics: Rocephin 11/19 >>> 11/20  HPI/Subjective: Patient alert but speech somewhat slurred and appears to be confused. No complaints. Requesting a complete exam rapidly so he can void. Pt re-evaluated after EGD- eating clears and asking when he can have solids. Have discussed cirrhosis and varices in detail with patient's wife.   Objective: Blood pressure 143/76, pulse 97, temperature 98.7 F (37.1 C), temperature source Oral, resp. rate 22, height 5\' 6"  (1.676 m), weight 182 lb 15.7 oz (83.001 kg), SpO2 99.00%.  Intake/Output Summary (Last 24 hours) at 03/10/13 1326 Last data filed at 03/10/13 1205  Gross per 24 hour  Intake 2445.84 ml  Output   1301 ml  Net 1144.84 ml     Exam: General: No acute respiratory distress Lungs: Clear to auscultation bilaterally without wheezes or crackles, RA Cardiovascular: Regular rate and rhythm without murmur gallop or rub normal S1 and S2, no peripheral edema or JVD Abdomen: Nontender, nondistended, soft, bowel sounds positive, no rebound, no ascites, no appreciable  mass Musculoskeletal: No significant cyanosis, clubbing of bilateral lower extremities Neurological: Awake and oriented x 2 at least name and place, moves all extremities x 4 without focal neurological  deficits, CN 2-12 intact step speech appears to be somewhat slurred or slow and is not accompanied by facial drooping or tongue deviation  Scheduled Meds:  Scheduled Meds: . insulin aspart  0-5 Units Subcutaneous QHS  . insulin aspart  0-9 Units Subcutaneous Q6H  . pantoprazole (PROTONIX) IV  40 mg Intravenous Q12H  . sodium chloride  1,000 mL Intravenous Once  . sodium chloride  1,000 mL Intravenous Once  . sodium chloride  3 mL Intravenous Q12H   Continuous Infusions: . sodium chloride 100 mL/hr at 03/10/13 0325  . octreotide (SANDOSTATIN) infusion 50 mcg/hr (03/10/13 0529)    **Reviewed in detail by the Attending Physician  Data Reviewed: Basic Metabolic Panel:  Recent Labs Lab 03/09/13 2039 03/10/13 0852  NA 141 140  K 5.3* 4.4  CL 108 107  CO2  --  22  GLUCOSE 200* 207*  BUN 37* 31*  CREATININE 1.00 0.70  CALCIUM  --  8.7   Liver Function Tests:  Recent Labs Lab 03/09/13 2022 03/10/13 0852  AST 65* 147*  ALT 69* 151*  ALKPHOS 81 57  BILITOT 1.8* 0.9  PROT 6.3 5.8*  ALBUMIN 3.0* 2.7*   No results found for this basename: LIPASE, AMYLASE,  in the last 168 hours No results found for this basename: AMMONIA,  in the last 168 hours CBC:  Recent Labs Lab 03/09/13 2022 03/09/13 2039 03/10/13 0345 03/10/13 0852  WBC 12.8*  --  11.6* 10.0  NEUTROABS 10.1*  --  8.1*  --   HGB 9.8* 10.5* 8.2* 8.4*  HCT 29.6* 31.0* 23.8* 24.7*  MCV 98.7  --  96.4 96.9  PLT 131*  --  107* 103*   Cardiac Enzymes: No results found for this basename: CKTOTAL, CKMB, CKMBINDEX, TROPONINI,  in the last 168 hours BNP (last 3 results) No results found for this basename: PROBNP,  in the last 8760 hours CBG:  Recent Labs Lab 03/10/13 0336 03/10/13 0642 03/10/13 1200  GLUCAP 181* 183* 214*    Recent Results (from the past 240 hour(s))  MRSA PCR SCREENING     Status: None   Collection Time    03/10/13  2:06 AM      Result Value Range Status   MRSA by PCR NEGATIVE   NEGATIVE Final   Comment:            The GeneXpert MRSA Assay (FDA     approved for NASAL specimens     only), is one component of a     comprehensive MRSA colonization     surveillance program. It is not     intended to diagnose MRSA     infection nor to guide or     monitor treatment for     MRSA infections.     Studies:  Recent x-ray studies have been reviewed in detail by the Attending Physician     Junious Silk, ANP [facility] Office  [phone] Pager [phone]  **If unable to reach the above provider after paging please contact the Flow Manager @ [phone]  On-Call/Text Page:      [email or web address]      password [email or web address]  If 7PM-7AM, please contact night-coverage [email or web address] Password [email or web address] 03/10/2013, 1:26 PM   LOS: 1 day   I have examined the patient, reviewed the chart and modified the  above note which I agree with.   Mykael Batz,MD [phone] 03/10/2013, 4:17 PM

## 2013-03-10 NOTE — Consult Note (Signed)
[facility] Consult: 8:30 AM 03/10/2013  LOS: 1 day    Referring Provider: Dr Lynden Oxford Primary Care Physician:  Dr Jeannetta Nap in [place] Primary Gastroenterologist:  unassigned    Reason for Consultation:  Melena and hematemesis   HPI: Justin Scott is a 62 y.o. male.  Working diabetic with no previous other medical problems or liver disease.  More easily fatigued in last few days.  Yesterday passed melenic type stools in AM and again in afternoon.  vomitted blood in afternoon.   At ED Hgb was 9.8 but today is 8.2.  Had another melenic stool this AM. Prolonged coags, increased BUN and mildly abnormal LFTs. A CT scan reveals cirrhosis and nodularity that may suggest metastatic disease.  Rectum is mildly thickened.  CT suggests esophageal varices.  Started on PPI and octreotide drip as well as Rocephin.      Past Medical History  Diagnosis Date  . Diabetes mellitus without complication     History reviewed. No pertinent past surgical history.  Prior to Admission medications   Medication Sig Start Date End Date Taking? Authorizing Provider  metFORMIN (GLUCOPHAGE) 1000 MG tablet Take 500-1,000 mg by mouth 2 (two) times daily. Takes 500mg  in morning and 1000mg  in evening   Yes Historical Provider, MD    Scheduled Meds: . cefTRIAXone (ROCEPHIN)  IV  2 g Intravenous QHS  . insulin aspart  0-5 Units Subcutaneous QHS  . insulin aspart  0-9 Units Subcutaneous Q6H  . [START ON 03/13/2013] pantoprazole (PROTONIX) IV  40 mg Intravenous Q12H  . sodium chloride  1,000 mL Intravenous Once  . sodium chloride  1,000 mL Intravenous Once  . sodium chloride  3 mL Intravenous Q12H   Infusions: . sodium chloride 100 mL/hr at 03/10/13 0325  . octreotide (SANDOSTATIN) infusion 50 mcg/hr (03/10/13 0529)  . pantoprozole (PROTONIX) infusion 8 mg/hr (03/10/13 0359)   PRN Meds: ondansetron (ZOFRAN) IV, ondansetron   Allergies as of  03/09/2013  . (No Known Allergies)    History reviewed. No pertinent family history.  History   Social History  . Marital Status: Married    Spouse Name: N/A    Number of Children: N/A  . Years of Education: N/A   Occupational History  . Janitor at [employer]   Social History Main Topics  . Smoking status: Former Games developer  . Smokeless tobacco: Not on file  . Alcohol Use: No  . Drug Use: No  . Sexual Activity: Not on file   Other Topics Concern  . Wife is ill and lives in a rest home   Social History Narrative  . Lives alone in [place]    REVIEW OF SYSTEMS: Constitutional:  No weight loss ENT:  No nose bleeds Pulm:  No cough or SOB CV:  No chest pain or palps GU:  No dyuria, frequency or blood in urine.   GI:  Per HPI Heme:  No hx low blood counts.    Transfusions:  none Neuro:  No headaches, no presyncope or fainting Derm:  No rash , no sores, no itching Endocrine:  No excessive thirst.  + sweats when having spells of weakness and stools yest and today Immunization:  Flu shot in Sept 2014 Travel:  none   PHYSICAL EXAM: Vital signs in last 24 hours: Filed Vitals:   03/10/13 0806  BP: 98/42  Pulse: 101  Temp: 98.7 F (37.1 C)  Resp: 19   Wt Readings from Last 3 Encounters:  03/10/13 83.001 kg (  182 lb 15.7 oz)    General: Pleasant, alert, pale, comfortable. Looks slightly acutely ill and slightly diaphoretic Head:  No trauma or asymmetry,.  No swelling  Eyes:  No icterus or pallor.  EOMI Ears:  HOH  Nose:  No discharge or bleeding Mouth:  Clear, moist, no blood or sores Neck:  No JVD, no TMG, no bruits Lungs:  Clear bil.  Not SOB or coughing Heart: RRR.  No MRG Abdomen:  Soft, NT, ND, active BS.  No mass or HSM.  No bruits.   Rectal: melenic stool in bed pain.  No digital axam   Musc/Skeltl: no joint deformity or swelling Extremities:  No CCE  Neurologic:  Pleasant, oriented x 3.  No tremor, no asterixis.  Moves all 4 limbs with full  strength Skin:  No telangectasia on face or trunk.  No sores, no rash Tattoos:  none Nodes:  No cervical adenopathy   Psych:  Pleasant, relaxed, appropriate questions  Intake/Output from previous day: 11/19 0701 - 11/20 0700 In: 2445.8 [I.V.:2345.8; IV Piggyback:100] Out: 800 [Urine:800] Intake/Output this shift: Total I/O In: -  Out: 300 [Urine:300]  LAB RESULTS:  Recent Labs  03/09/13 2022 03/09/13 2039 03/10/13 0345  WBC 12.8*  --  11.6*  HGB 9.8* 10.5* 8.2*  HCT 29.6* 31.0* 23.8*  PLT 131*  --  107*  MCV     98  BMET Lab Results  Component Value Date   NA 141 03/09/2013   K 5.3* 03/09/2013   CL 108 03/09/2013   GLUCOSE 200* 03/09/2013   BUN 37* 03/09/2013   CREATININE 1.00 03/09/2013   LFT  Recent Labs  03/09/13 2022  PROT 6.3  ALBUMIN 3.0*  AST 65*  ALT 69*  ALKPHOS 81  BILITOT 1.8*  BILIDIR 0.5*  IBILI 1.3*   PT/INR Lab Results  Component Value Date   INR 1.61* 03/09/2013       Protime                  18.7  Hepatitis Panel No results found for this basename: HEPBSAG, HCVAB, HEPAIGM, HEPBIGM,  in the last 72 hours  Drugs of Abuse  No results found for this basename: labopia, cocainscrnur, labbenz, amphetmu, thcu, labbarb     RADIOLOGY STUDIES: Ct Abdomen Pelvis W Contrast 03/09/2013   FINDINGS: Is distal esophageal contrast medium suggest gastroesophageal reflux. Borderline distal esophageal wall thickening.  Numerous small solid-appearing lesions are scattered throughout the liver. Prominently nodular hepatic contour compatible with cirrhosis. Upper normal spleen size. Portal vein and splenic vein patent. Scattered small varices along the gastrohepatic ligament and tracking adjacent to the distal esophagus.  Pancreas and adrenal glands unremarkable. Kidneys and proximal ureters unremarkable.  Small retroperitoneal lymph nodes are not pathologically enlarged by size criteria. A portacaval node measures 1.2 cm in short axis. No pathologic pelvic  adenopathy. Moderately prominent prostate gland at 5.3 x 3.5 cm.  Possible wall thickening in the rectum on images 71 through 79 of series 2.  Bridging spurring of the sacroiliac joints.  Lumbar spondylosis and degenerative disc disease.  IMPRESSION: 1. Prominent hepatic cirrhosis, with scattered complex lesions throughout the liver. Malignancy is not excluded. I recommend a follow-up non urgent hepatic protocol MRI for further characterization of these scattered liver lesions. 2. Mild wall thickening in the rectum may be incidental, but rectal tumor is not readily excluded. 3. Atherosclerosis. 4. Portal venous hypertension. 5. Gastroesophageal reflux. 6. Mildly prominent prostate gland. 7. Lumbar spondylosis and degenerative disc  disease.   Electronically Signed   By: Herbie Baltimore M.D.   On: 03/09/2013 23:22   Dg Chest Portable 1 View 03/09/2013   CLINICAL DATA:  Spitting up blood, GI bleed  EXAM: PORTABLE CHEST - 1 VIEW  COMPARISON:  None.  FINDINGS: Borderline enlarged cardiac silhouette and mediastinal contours, possibly attributable to projection and decreased lung volumes. There is mild diffuse slightly nodular thickening of the pulmonary interstitium. A punctate granuloma overlies the peripheral aspect of the right upper long. Minimal left basilar opacities favored to represent atelectasis. No discrete focal airspace opacities. No definite pleural effusion or pneumothorax. No evidence of edema. Suspected old/healed left-sided lateral sixth rib fracture.  IMPRESSION: Bronchitic change and left basilar atelectasis without definite acute cardiopulmonary disease on this AP portable examination. Further evaluation with a PA and lateral chest radiograph may be obtained as clinically indicated.   Electronically Signed   By: Simonne Come M.D.   On: 03/09/2013 19:42    ENDOSCOPIC STUDIES: None ever  IMPRESSION:   *  Upper GI bleed.  Rule out esophageal variceal bleed vs PUD.  Esophageal varices suggested  by CT . On octreotide and ppi drip.   *  Cirrhosis of liver, new diagnoses.  Ct scan with complex lesions, unable to discern if malignancies or not. Pt is not a drinker and never has consumed ETOH  *  Thickened rectal wall on CT, rule out cancer. Will need lower GI eval in future, probably as outpt.   *  Intermittent solid food dysphagia.  Rule out esophageal stricture vs dysmotility.   *  Coagulopathy.   *  Normocytic anemia  *  Thrombocytopenia.   *  DM2.   *  Azotemia.     PLAN:     *  EGD today.  D/W pt.and Dr Christella Hartigan.    [name]  03/10/2013, 8:30 AM Pager: [phone]     ________________________________________________________________________  [facility] MD note:  I personally examined the patient, reviewed the data and agree with the assessment and plan described above.  Planning on EGD this morning for GI bleeding, likely upper.   Rob Bunting, MD [facility] Pager [phone]

## 2013-03-10 NOTE — Progress Notes (Signed)
Utilization Review Completed.Delfina Schreurs T11/20/2014  

## 2013-03-10 NOTE — Op Note (Signed)
[name] [address] [place], [address]   ENDOSCOPY PROCEDURE REPORT  PATIENT: Justin Scott, Justin Scott  MR#: [id number] BIRTHDATE: [birth year] , 61  yrs. old GENDER: Male ENDOSCOPIST: Rachael Fee, MD PROCEDURE DATE:  03/10/2013 PROCEDURE:  EGD w/ band ligation of varices ASA CLASS:     Class IV INDICATIONS:  GI bleeding, newly diagnosed cirrhosis (non drinker).  MEDICATIONS: Fentanyl 37.5 mcg IV, Versed 3 mg IV, and These medications were titrated to patient response per physician's verbal order TOPICAL ANESTHETIC: Cetacaine Spray  DESCRIPTION OF PROCEDURE: After the risks benefits and alternatives of the procedure were thoroughly explained, informed consent was obtained.  The PENTAX GASTOROSCOPE [id number] endoscope was introduced through the mouth and advanced to the second portion of the duodenum. Without limitations.  The instrument was slowly withdrawn as the mucosa was fully examined.    There were three trunks of small to medium sized distal esophagus varices.  None had clear signs of recent bleeding however there was hematin in stomach was well as a small amount of fresh red blood at GE junction.  There was moderate portal gastropathy throughout the stomach.  I placed 7 variceal ligating bands (one misfired) without immediate complication.  The examination was otherwise normal. Retroflexed views revealed no abnormalities.     The scope was then withdrawn from the patient and the procedure completed. COMPLICATIONS: There were no complications. ENDOSCOPIC IMPRESSION: There were three trunks of small to medium sized distal esophagus varices.  None had clear signs of recent bleeding however there was hematin in stomach was well as a small amount of fresh red blood at GE junction.  There was moderate portal gastropathy throughout the stomach.  I placed 7 variceal ligating bands (one misfired) without immediate complication.  The examination was  otherwise normal.  RECOMMENDATIONS: Should continue octreotide drip for another 24-36 hours, PPI twice daily.  He was already started on empiric antibioitics.  He can have clear liquids today.  His Hb has decreased to 8.4 and has received quite a lot of fluids so I suspect current value is even lower.  I am ordering 2 unit blood transfusion.   eSigned:  Rachael Fee, MD 03/10/2013 11:02 AM

## 2013-03-10 NOTE — H&P (Signed)
[facility] History and Physical  Patient: Justin Scott  ZOX:[id number]  DOB: 05-26-1950  DOS: the patient was seen and examined on 03/10/2013 PCP: No primary provider on file.  Chief Complaint: GI bleed  HPI: Justin Scott is a 62 y.o. male with Past medical history of diabetes mellitus. The patient is coming from home. Patient mentions that since last 2 days he has been having black color bowel movements. He had 2 black bowel movements yesterday and 2 today. He also had an episode of vomiting of blood. He mentioned that he had left lower quadrant abdominal pain on the night because of which he was not able to sleep for a while. Currently he mentions that the abdominal pain is better. Today he came to the hospital after he was found in the gym on the ground with an episode of vomiting of blood. He complains of dizziness right now but denies any complaint of chest pain, shortness of breath, fever, chills. He denies any similar episode in the past. He denies any significant alcohol abuse in the past or blood transfusion or changes in her medication. He mentions that he has history of diabetes mellitus since last one year. He also denies history of jaundice or hepatitis. He never had colonoscopy or upper GI endoscopy in the past.   Review of Systems: as mentioned in the history of present illness.  A Comprehensive review of the other systems is negative.  Past Medical History  Diagnosis Date  . Diabetes mellitus without complication    History reviewed. No pertinent past surgical history. Social History:  reports that he has quit smoking. He does not have any smokeless tobacco history on file. He reports that he does not drink alcohol or use illicit drugs. Independent for most of his  ADL.  No Known Allergies  History reviewed. No pertinent family history.  Prior to Admission medications   Medication Sig Start Date End Date Taking? Authorizing Provider  metFORMIN  (GLUCOPHAGE) 1000 MG tablet Take 500-1,000 mg by mouth 2 (two) times daily. Takes 500mg  in morning and 1000mg  in evening   Yes Historical Provider, MD    Physical Exam: Filed Vitals:   03/10/13 0000 03/10/13 0013 03/10/13 0100 03/10/13 0200  BP: 110/62 110/62 103/67 103/44  Pulse: 101 109 97 144  Temp:    99 F (37.2 C)  TempSrc:    Oral  Resp:    37  Height:    5\' 6"  (1.676 m)  Weight:    83.008 kg (183 lb)  SpO2: 98% 99% 98% 99%    General: Alert, Awake and Oriented to Time, Place and Person. Appear in mild distress Eyes: PERRL ENT: Oral Mucosa clear dry. Neck: no JVD Cardiovascular: S1 and S2 Present, aortic systolic  Murmur, Peripheral Pulses Present Respiratory: Bilateral Air entry equal and Decreased, Clear to Auscultation,  no Crackles,no wheezes Abdomen: Bowel Sound Present, Soft and Non tender Skin: no Rash Extremities: no Pedal edema, no calf tenderness Neurologic: Grossly Unremarkable.  Labs on Admission:  CBC:  Recent Labs Lab 03/09/13 2022 03/09/13 2039 03/10/13 0345  WBC 12.8*  --  11.6*  NEUTROABS 10.1*  --  8.1*  HGB 9.8* 10.5* 8.2*  HCT 29.6* 31.0* 23.8*  MCV 98.7  --  96.4  PLT 131*  --  PENDING    CMP     Component Value Date/Time   NA 141 03/09/2013 2039   K 5.3* 03/09/2013 2039   CL 108 03/09/2013 2039   GLUCOSE  200* 03/09/2013 2039   BUN 37* 03/09/2013 2039   CREATININE 1.00 03/09/2013 2039   PROT 6.3 03/09/2013 2022   ALBUMIN 3.0* 03/09/2013 2022   AST 65* 03/09/2013 2022   ALT 69* 03/09/2013 2022   ALKPHOS 81 03/09/2013 2022   BILITOT 1.8* 03/09/2013 2022    No results found for this basename: LIPASE, AMYLASE,  in the last 168 hours No results found for this basename: AMMONIA,  in the last 168 hours  No results found for this basename: CKTOTAL, CKMB, CKMBINDEX, TROPONINI,  in the last 168 hours BNP (last 3 results) No results found for this basename: PROBNP,  in the last 8760 hours  Radiological Exams on Admission: Ct  Abdomen Pelvis W Contrast  03/09/2013   CLINICAL DATA:  Gastrointestinal bleeding.  Diabetes.  EXAM: CT ABDOMEN AND PELVIS WITH CONTRAST  TECHNIQUE: Multidetector CT imaging of the abdomen and pelvis was performed using the standard protocol following bolus administration of intravenous contrast.  CONTRAST:  100 cc Omnipaque 300  COMPARISON:  None.  FINDINGS: Is distal esophageal contrast medium suggest gastroesophageal reflux. Borderline distal esophageal wall thickening.  Numerous small solid-appearing lesions are scattered throughout the liver. Prominently nodular hepatic contour compatible with cirrhosis. Upper normal spleen size. Portal vein and splenic vein patent. Scattered small varices along the gastrohepatic ligament and tracking adjacent to the distal esophagus.  Pancreas and adrenal glands unremarkable. Kidneys and proximal ureters unremarkable.  Small retroperitoneal lymph nodes are not pathologically enlarged by size criteria. A portacaval node measures 1.2 cm in short axis. No pathologic pelvic adenopathy. Moderately prominent prostate gland at 5.3 x 3.5 cm.  Possible wall thickening in the rectum on images 71 through 79 of series 2.  Bridging spurring of the sacroiliac joints.  Lumbar spondylosis and degenerative disc disease.  IMPRESSION: 1. Prominent hepatic cirrhosis, with scattered complex lesions throughout the liver. Malignancy is not excluded. I recommend a follow-up non urgent hepatic protocol MRI for further characterization of these scattered liver lesions. 2. Mild wall thickening in the rectum may be incidental, but rectal tumor is not readily excluded. 3. Atherosclerosis. 4. Portal venous hypertension. 5. Gastroesophageal reflux. 6. Mildly prominent prostate gland. 7. Lumbar spondylosis and degenerative disc disease.   Electronically Signed   By: Herbie Baltimore M.D.   On: 03/09/2013 23:22   Dg Chest Portable 1 View  03/09/2013   CLINICAL DATA:  Spitting up blood, GI bleed  EXAM:  PORTABLE CHEST - 1 VIEW  COMPARISON:  None.  FINDINGS: Borderline enlarged cardiac silhouette and mediastinal contours, possibly attributable to projection and decreased lung volumes. There is mild diffuse slightly nodular thickening of the pulmonary interstitium. A punctate granuloma overlies the peripheral aspect of the right upper long. Minimal left basilar opacities favored to represent atelectasis. No discrete focal airspace opacities. No definite pleural effusion or pneumothorax. No evidence of edema. Suspected old/healed left-sided lateral sixth rib fracture.  IMPRESSION: Bronchitic change and left basilar atelectasis without definite acute cardiopulmonary disease on this AP portable examination. Further evaluation with a PA and lateral chest radiograph may be obtained as clinically indicated.   Electronically Signed   By: Simonne Come M.D.   On: 03/09/2013 19:42    EKG: Independently reviewed. sinus tachycardia.  Assessment/Plan Principal Problem:   GI bleed Active Problems:   Cirrhosis of liver without mention of alcohol   Diabetes mellitus, type II   1. GI bleed The patient is presenting with melena and dark maroon blood coming out from the NG tube  likely secondary to an upper GI bleeding source although he has complained of left lower quadrant pain which makes a case for lower GI bleeding as well. He has undergone a CT scan which is suggestive of cirrhosis of the liver. Currently he is hemodynamically stable with hemoglobin of 9. I have discussed the case with the GI attending on call considering his history of cirrhosis and possibility of varices  I will place him on octreotide drip. I would also give him Protonix bolus and infusion. IV fluids will be given. IV Zofran will be given as well. NG tube is removed. Patient will be kept n.p.o. and GI will be performing the procedure early in the morning.  2. cirrhosis of the liver  Etiology currently unclear patient does not have a  long-standing history of diabetes but may have history of fatty liver. Giving the patient vitamin K x1 due to elevated INR.  Diabetes mellitus   pacing the patient on sliding scale holding metformin   Consults: GI   DVT Prophylaxis: mechanical compression device Nutrition:  n.p.o.   Code Status:  full   Disposition: Admitted to inpatient in step-down unit.  Author: Lynden Oxford, MD [facility] Pager: [phone] 03/10/2013, 4:42 AM    If 7PM-7AM, please contact night-coverage [email or web address] Password [email or web address]

## 2013-03-10 NOTE — ED Notes (Signed)
14 french NG tube placed. intermittant suction of dark blood then red blood obtained. EDP and resident aware of amount of returned blood from NG tube. Pt given medication for comfort due to complaints about NG tube.

## 2013-03-11 ENCOUNTER — Encounter: Payer: Self-pay | Admitting: Gastroenterology

## 2013-03-11 ENCOUNTER — Encounter (HOSPITAL_COMMUNITY): Payer: Self-pay | Admitting: Gastroenterology

## 2013-03-11 DIAGNOSIS — D696 Thrombocytopenia, unspecified: Secondary | ICD-10-CM

## 2013-03-11 DIAGNOSIS — D62 Acute posthemorrhagic anemia: Secondary | ICD-10-CM

## 2013-03-11 LAB — CBC
HCT: 25.5 % — ABNORMAL LOW (ref 39.0–52.0)
Hemoglobin: 8.9 g/dL — ABNORMAL LOW (ref 13.0–17.0)
MCH: 32.9 pg (ref 26.0–34.0)
MCHC: 35.4 g/dL (ref 30.0–36.0)
MCHC: 34.9 g/dL (ref 30.0–36.0)
MCV: 93 fL (ref 78.0–100.0)
MCV: 93.4 fL (ref 78.0–100.0)
Platelets: 66 10*3/uL — ABNORMAL LOW (ref 150–400)
RDW: 14.8 % (ref 11.5–15.5)
RDW: 14.5 % (ref 11.5–15.5)
WBC: 8.6 10*3/uL (ref 4.0–10.5)

## 2013-03-11 LAB — GLUCOSE, CAPILLARY
Glucose-Capillary: 197 mg/dL — ABNORMAL HIGH (ref 70–99)
Glucose-Capillary: 205 mg/dL — ABNORMAL HIGH (ref 70–99)
Glucose-Capillary: 210 mg/dL — ABNORMAL HIGH (ref 70–99)
Glucose-Capillary: 212 mg/dL — ABNORMAL HIGH (ref 70–99)
Glucose-Capillary: 234 mg/dL — ABNORMAL HIGH (ref 70–99)
Glucose-Capillary: 160 mg/dL — ABNORMAL HIGH (ref 70–99)

## 2013-03-11 LAB — URINE CULTURE

## 2013-03-11 LAB — TYPE AND SCREEN
ABO/RH(D): O POS
Unit division: 0

## 2013-03-11 LAB — PROTIME-INR: INR: 1.52 — ABNORMAL HIGH (ref 0.00–1.49)

## 2013-03-11 LAB — COMPREHENSIVE METABOLIC PANEL
ALT: 219 U/L — ABNORMAL HIGH (ref 0–53)
CO2: 23 mEq/L (ref 19–32)
Calcium: 8.5 mg/dL (ref 8.4–10.5)
Chloride: 105 mEq/L (ref 96–112)
Creatinine, Ser: 0.68 mg/dL (ref 0.50–1.35)
GFR calc Af Amer: >90 mL/min (ref >90)
GFR calc non Af Amer: >90 mL/min (ref >90)
Glucose, Bld: 215 mg/dL — ABNORMAL HIGH (ref 70–99)
Total Bilirubin: 0.9 mg/dL (ref 0.3–1.2)

## 2013-03-11 LAB — AMMONIA: Ammonia: 41 umol/L (ref 11–60)

## 2013-03-11 LAB — AFP TUMOR MARKER: AFP-Tumor Marker: 4.5 ng/mL (ref 0.0–8.0)

## 2013-03-11 MED ORDER — MORPHINE SULFATE 2 MG/ML IJ SOLN
2.0000 mg | Freq: Four times a day (QID) | INTRAMUSCULAR | Status: DC | PRN
  Administered 2013-03-11: 2 mg via INTRAVENOUS
  Filled 2013-03-11: qty 1

## 2013-03-11 MED ORDER — PANTOPRAZOLE SODIUM 40 MG PO TBEC
40.0000 mg | DELAYED_RELEASE_TABLET | Freq: Two times a day (BID) | ORAL | Status: DC
  Administered 2013-03-11 – 2013-03-13 (×5): 40 mg via ORAL
  Filled 2013-03-11 (×5): qty 1

## 2013-03-11 MED ORDER — HYDROCODONE-ACETAMINOPHEN 5-325 MG PO TABS: 1.0000 | ORAL_TABLET | ORAL | Status: DC | PRN

## 2013-03-11 MED ORDER — INSULIN ASPART 100 UNIT/ML ~~LOC~~ SOLN
0.0000 [IU] | Freq: Three times a day (TID) | SUBCUTANEOUS | Status: DC
  Administered 2013-03-12 (×3): 2 [IU] via SUBCUTANEOUS
  Administered 2013-03-13: 08:00:00 1 [IU] via SUBCUTANEOUS
  Administered 2013-03-13: 2 [IU] via SUBCUTANEOUS

## 2013-03-11 NOTE — Progress Notes (Signed)
[facility] Progress Note Justin Scott ZOX:[id number] DOB: 11/02/1950 DOA: 03/09/2013 PCP: No primary provider on file.  Brief narrative: 62 year old male patient with only known past medical history of diabetes. Patient lives at home. For 2 days prior to arrival he been having black colored stools as well as an episode of melanotic vomiting. This was also associated with left lower quadrant abdominal pain. On the day of admission he was found on the floor of a local gymnasium after vomiting blood. He complained of dizziness after arrival to the emergency department but no other symptoms. Denies significant alcohol abuse, blood transfusions or changes in medications. In the ER he appeared very dehydrated with potassium of 5.3 and a lactic acid of 5.0, mild transaminitis, leukocytosis. White count was 12,800 and platelet count was 131,000. No previous labs to compare. In the ER his blood pressure was mostly in the low 100s but no apparent hypotension.  Assessment/Plan: Active Problems:   Acute upper GI bleed/Esophageal varices with bleeding -GI following -EGD 11/20 revealed portal gastropathy and varices post banding -cont Octreotide gtt cont for add'l 24-36 hours -GI advancing to fulls -cont PPI    Acute blood loss anemia -hgb increased briefly after 1 unit PRBCs-hgb drifted down after hydration so GI had ordered 2 more PRBCs 11/20-hg now >9.0 -follow CBC q 12 hrs    Cirrhosis of liver without mention of alcohol/Transaminitis -pt denies ETOH use -? NASH-check FLP -AFP and acute hepatitis panel within normal limits -ammonia level 41 -LFTs subtle trend up    Lactic acidosis -from acute bleeding -was on Metformin pre admit but renal function was normal    Diabetes mellitus, type II -holding Metformin -cont SSI    Thrombocytopenia -due to cirrhosis and consumption for ABL anemia    Possible rectal wall thickening -seen on  CT-defer to GI  Elevated INR - vit K dose - INR still elevated  DVT prophylaxis: SCDs Code Status: Full Family Communication: with patient's wife Disposition Plan/Expected LOS: Transfer to floor Isolation: None Nutritional Status: Acute protein calorie malnutrition related to upper GI bleeding  Consultants: Gastroenterology  Procedures: EGD  11/20 There were three trunks of small to medium sized distal esophagus  varices. None had clear signs of recent bleeding however there was  hematin in stomach was well as a small amount of fresh red blood at  GE junction. There was moderate portal gastropathy throughout the  stomach. I placed 7 variceal ligating bands (one misfired) without  immediate complication. The examination was otherwise normal.  Antibiotics: Rocephin 11/19 >>> 11/20  HPI/Subjective: Patient alert but still seems a little confused.  Objective: Blood pressure 113/58, pulse 82, temperature 99.8 F (37.7 C), temperature source Oral, resp. rate 22, height 5\' 6"  (1.676 m), weight 182 lb 15.7 oz (83.001 kg), SpO2 98.00%.  Intake/Output Summary (Last 24 hours) at 03/11/13 1237 Last data filed at 03/11/13 0900  Gross per 24 hour  Intake   3700 ml  Output   2153 ml  Net   1547 ml     Exam: General: No acute respiratory distress Lungs: Clear to auscultation bilaterally without wheezes or crackles, RA Cardiovascular: Regular rate and rhythm without murmur gallop or rub normal S1 and S2, no peripheral edema or JVD Abdomen: Nontender, nondistended, soft, bowel sounds positive, no rebound, no ascites, no appreciable mass Musculoskeletal: No significant cyanosis, clubbing of bilateral lower extremities Neurological: Awake and oriented x 2 at least name and  place, moves all extremities x 4 without focal neurological deficits, CN 2-12 intact step speech appears to be somewhat slurred or slow and is not accompanied by facial drooping or tongue deviation  Scheduled Meds:    Scheduled Meds: . insulin aspart  0-5 Units Subcutaneous QHS  . insulin aspart  0-9 Units Subcutaneous Q6H  . pantoprazole  40 mg Oral BID  . sodium chloride  1,000 mL Intravenous Once  . sodium chloride  1,000 mL Intravenous Once  . sodium chloride  3 mL Intravenous Q12H   Continuous Infusions: . sodium chloride 10 mL/hr (03/11/13 0937)  . octreotide (SANDOSTATIN) infusion 50 mcg/hr (03/11/13 0429)    **Reviewed in detail by the Attending Physician  Data Reviewed: Basic Metabolic Panel:  Recent Labs Lab 03/09/13 2039 03/10/13 0852 03/11/13 0255  NA 141 140 138  K 5.3* 4.4 3.6  CL 108 107 105  CO2  --  22 23  GLUCOSE 200* 207* 215*  BUN 37* 31* 21  CREATININE 1.00 0.70 0.68  CALCIUM  --  8.7 8.5   Liver Function Tests:  Recent Labs Lab 03/09/13 2022 03/10/13 0852 03/11/13 0255  AST 65* 147* 179*  ALT 69* 151* 219*  ALKPHOS 81 57 59  BILITOT 1.8* 0.9 0.9  PROT 6.3 5.8* 5.8*  ALBUMIN 3.0* 2.7* 2.9*   No results found for this basename: LIPASE, AMYLASE,  in the last 168 hours  Recent Labs Lab 03/11/13 1010  AMMONIA 41   CBC:  Recent Labs Lab 03/09/13 2022 03/09/13 2039 03/10/13 0345 03/10/13 0852 03/10/13 2100 03/11/13 0255  WBC 12.8*  --  11.6* 10.0  --  8.6  NEUTROABS 10.1*  --  8.1*  --   --   --   HGB 9.8* 10.5* 8.2* 8.4* 9.7* 9.7*  9.8*  HCT 29.6* 31.0* 23.8* 24.7* 27.8* 28.0*  27.7*  MCV 98.7  --  96.4 96.9  --  93.0  PLT 131*  --  107* 103*  --  66*   Cardiac Enzymes: No results found for this basename: CKTOTAL, CKMB, CKMBINDEX, TROPONINI,  in the last 168 hours BNP (last 3 results) No results found for this basename: PROBNP,  in the last 8760 hours CBG:  Recent Labs Lab 03/10/13 1200 03/10/13 1658 03/11/13 0023 03/11/13 0612 03/11/13 0900  GLUCAP 214* 225* 197* 205* 193*    Recent Results (from the past 240 hour(s))  CULTURE, BLOOD (ROUTINE X 2)     Status: None   Collection Time    03/09/13 11:14 PM      Result Value  Range Status   Specimen Description BLOOD RIGHT ARM   Final   Special Requests BOTTLES DRAWN AEROBIC AND ANAEROBIC 10CC EACH   Final   Culture  Setup Time     Final   Value: 03/10/2013 04:45     Performed at [facility]   Culture     Final   Value:        BLOOD CULTURE RECEIVED NO GROWTH TO DATE CULTURE WILL BE HELD FOR 5 DAYS BEFORE ISSUING A FINAL NEGATIVE REPORT     Performed at [facility]   Report Status PENDING   Incomplete  URINE CULTURE     Status: None   Collection Time    03/09/13 11:18 PM      Result Value Range Status   Specimen Description URINE, CLEAN CATCH   Final   Special Requests NONE   Final   Culture  Setup Time  Final   Value: 03/10/2013 05:13     Performed at [facility] Count     Final   Value: NO GROWTH     Performed at [facility]   Culture     Final   Value: NO GROWTH     Performed at [facility]   Report Status 03/11/2013 FINAL   Final  CULTURE, BLOOD (ROUTINE X 2)     Status: None   Collection Time    03/09/13 11:23 PM      Result Value Range Status   Specimen Description BLOOD RIGHT HAND   Final   Special Requests BOTTLES DRAWN AEROBIC ONLY 10CC   Final   Culture  Setup Time     Final   Value: 03/10/2013 04:44     Performed at [facility]   Culture     Final   Value:        BLOOD CULTURE RECEIVED NO GROWTH TO DATE CULTURE WILL BE HELD FOR 5 DAYS BEFORE ISSUING A FINAL NEGATIVE REPORT     Performed at [facility]   Report Status PENDING   Incomplete  MRSA PCR SCREENING     Status: None   Collection Time    03/10/13  2:06 AM      Result Value Range Status   MRSA by PCR NEGATIVE  NEGATIVE Final   Comment:            The GeneXpert MRSA Assay (FDA     approved for NASAL specimens     only), is one component of a     comprehensive MRSA colonization     surveillance program. It is not     intended to diagnose MRSA     infection nor to guide or     monitor treatment for       MRSA infections.     Studies:  Recent x-ray studies have been reviewed in detail by the Attending Physician     Junious Silk, ANP [facility] Office  [phone] Pager [phone]  **If unable to reach the above provider after paging please contact the Flow Manager @ [phone]  On-Call/Text Page:      [email or web address]      password [email or web address]  If 7PM-7AM, please contact night-coverage [email or web address] Password [facility] 03/11/2013, 12:37 PM   LOS: 2 days   I have examined the patient, reviewed the chart and modified the above note which I agree with.   Stanislaus Kaltenbach,MD [phone] 03/11/2013, 1:47 PM

## 2013-03-11 NOTE — Progress Notes (Signed)
Gi Daily Rounding Note 03/11/2013, 8:28 AM  LOS: 2 days   SUBJECTIVE:       On Clears, no nausea or vomiting.  2 black stools overnight.  Pt c/o weakness.  Slept poorly, felt restless.  Some inappropriate behavior around BMs: did not call nurse, stooled in bed and reached hands into stool, ostensibly to check its appearance.  IVF at 100 ml per hour.   OBJECTIVE:         Vital signs in last 24 hours:    Temp:  [97.8 F (36.6 C)-99.7 F (37.6 C)] 98.4 F (36.9 C) (11/21 0812) Pulse Rate:  [84-106] 96 (11/21 0423) Resp:  [16-30] 19 (11/21 0812) BP: (105-175)/(47-87) 110/83 mmHg (11/21 0812) SpO2:  [96 %-100 %] 96 % (11/21 0423) Last BM Date: 03/11/13    03/10/13 0200 03/10/13 0443  Weight: 83.008 kg (183 lb) 83.001 kg (182 lb 15.7 oz)   General: comfortable, a bit slow but fully awake   Heart: RRR Chest: clear bil. Abdomen: soft, NT, ND.  BS active  Extremities: no CCE Neuro/Psych:  Pleasant, cooperative, oriented x 3.  No asterixis, no tremor.  Some cognitive slowing, mild.   Intake/Output from previous day: 11/20 0701 - 11/21 0700 In: 3700 [I.V.:3000; Blood:650; IV Piggyback:50] Out: 2154 [Urine:2150; Stool:4]  Intake/Output this shift:    Lab Results:  Recent Labs  03/10/13 0345 03/10/13 0852 03/10/13 2100 03/11/13 0255  WBC 11.6* 10.0  --  8.6  HGB 8.2* 8.4* 9.7* 9.7*  9.8*  HCT 23.8* 24.7* 27.8* 28.0*  27.7*  PLT 107* 103*  --  66*   BMET  Recent Labs  03/09/13 2039 03/10/13 0852 03/11/13 0255  NA 141 140 138  K 5.3* 4.4 3.6  CL 108 107 105  CO2  --  22 23  GLUCOSE 200* 207* 215*  BUN 37* 31* 21  CREATININE 1.00 0.70 0.68  CALCIUM  --  8.7 8.5   LFT  Recent Labs  03/09/13 2022 03/10/13 0852 03/11/13 0255  PROT 6.3 5.8* 5.8*  ALBUMIN 3.0* 2.7* 2.9*  AST 65* 147* 179*  ALT 69* 151* 219*  ALKPHOS 81 57 59  BILITOT 1.8* 0.9 0.9  BILIDIR 0.5*  --   --   IBILI 1.3*  --   --    PT/INR  Recent Labs  03/10/13 0852  03/11/13 0255  LABPROT 18.2* 17.9*  INR 1.55* 1.52*   Hepatitis Panel  Recent Labs  03/10/13 0910  HEPBSAG NEGATIVE  HCVAB NEGATIVE  HEPAIGM NON REACTIVE  HEPBIGM NON REACTIVE    Studies/Results: Ct Abdomen Pelvis W Contrast 03/09/2013   .  IMPRESSION: 1. Prominent hepatic cirrhosis, with scattered complex lesions throughout the liver. Malignancy is not excluded. I recommend a follow-up non urgent hepatic protocol MRI for further characterization of these scattered liver lesions. 2. Mild wall thickening in the rectum may be incidental, but rectal tumor is not readily excluded. 3. Atherosclerosis. 4. Portal venous hypertension. 5. Gastroesophageal reflux. 6. Mildly prominent prostate gland. 7. Lumbar spondylosis and degenerative disc disease.   Electronically Signed   By: Herbie Baltimore M.D.   On: 03/09/2013 23:22   Dg Chest Portable 1 View 03/09/2013     IMPRESSION: Bronchitic change and left basilar atelectasis without definite acute cardiopulmonary disease on this AP portable examination. Further evaluation with a PA and lateral chest radiograph may be obtained as clinically indicated.   Electronically Signed   By: Simonne Come M.D.   On:  03/09/2013 19:42    ASSESMENT:  *  UGIB with melena and hematemesis.   EGD 03/10/13: small to medium distal esophageal varices, no stigmata of bleeding but fresh blood seen at GE Jx, moderate portal gasstropathy.  Varices ligated with 6 bands.   AST/ALT rising.  Octreotide drip, Protonix IV BID.  Zosyn replaced Rocephin 03/10/13.  Hepatitis acute serologies negative. AFP tumor marker is 4.5, not elevated. Pt without hx of ETOH consumption.   *  ABL anemia.  S/p transfusion x 2 units.  Hgb improved.   *  Thrombocytopenia, worsened.   *  Coagulopathy, improved.  Got total of 10 mg Vitamin K IV 11/20  *  Azotemia, resolved.   *  NIDDM.    PLAN   * Change to po Protonix BID, agree with full liquids.  CBC q 12 hours.  Check ammonia level.  IVF to [facility].  Daily weights.  CMET in AM.  Complete 72 hours of Octreotide. ROV with Dr Christella Hartigan 12/19 at 2:30 PM.  Will likely require future egd and repeat banding of varices.     [name]  03/11/2013, 8:28 AM Pager: [phone]  ________________________________________________________________________  [facility] MD note:  I personally examined the patient, reviewed the data and agree with the assessment and plan described above.  Will be OK to stop octreotide in the morning.  Should continue on twice daily PPI after that.  I will advance his diet.  We have already set him up with follow up appt with me in office in 2-3 weeks. Will discuss hepatoma screening, will plan to repeat variceal ligation, will consider colon cancer screening, will immunize for Hep A and B. He does not have ascites on exam, CT or edema in legs so no need for diuretics currently.     Rob Bunting, MD [facility] Pager [phone]

## 2013-03-11 NOTE — Progress Notes (Signed)
Patient transferred to 5w31 from 2C13. Patient is A&Ox3. Patient lives at home alone. Patient's skin is warm, dry and intact.  Patient oriented to room. Will continue to monitor patient. Nelda Marseille, RN

## 2013-03-11 NOTE — Progress Notes (Signed)
CBGs on 11/20: [phone] mg/dl       16/10: 960-454 mg/dl Consider changing Novolog SENSITIVE correction scale to [facility] if patient is eating.  Will continue to follow while in hospital.  Smith Mince RN BSN CDE

## 2013-03-11 NOTE — ED Provider Notes (Signed)
I saw and evaluated the patient, reviewed the resident's note and I agree with the findings and plan. If applicable, I agree with the resident's interpretation of the EKG.  If applicable, I was present for critical portions of any procedures performed.  Black stool with syncope.  Vitals stable. Abdomen distended, soft nontender. IVF, PPI,  Abx.  CT with possible varices, octreotide started.  D/w Dr. Marina Goodell.  Vitals remained stable in ED.   CRITICAL CARE Performed by: Glynn Octave Total critical care time: 30 Critical care time was exclusive of separately billable procedures and treating other patients. Critical care was necessary to treat or prevent imminent or life-threatening deterioration. Critical care was time spent personally by me on the following activities: development of treatment plan with patient and/or surrogate as well as nursing, discussions with consultants, evaluation of patient's response to treatment, examination of patient, obtaining history from patient or surrogate, ordering and performing treatments and interventions, ordering and review of laboratory studies, ordering and review of radiographic studies, pulse oximetry and re-evaluation of patient's condition.    Glynn Octave, MD 03/11/13 [phone]

## 2013-03-12 DIAGNOSIS — E119 Type 2 diabetes mellitus without complications: Secondary | ICD-10-CM

## 2013-03-12 LAB — LIPID PANEL
HDL: 18 mg/dL — ABNORMAL LOW (ref >39)
Total CHOL/HDL Ratio: 4.5 RATIO
VLDL: 19 mg/dL (ref 0–40)

## 2013-03-12 LAB — CBC
HCT: 25.1 % — ABNORMAL LOW (ref 39.0–52.0)
Hemoglobin: 8.9 g/dL — ABNORMAL LOW (ref 13.0–17.0)
MCH: 33.3 pg (ref 26.0–34.0)
MCV: 94 fL (ref 78.0–100.0)
Platelets: 61 10*3/uL — ABNORMAL LOW (ref 150–400)
RBC: 2.67 MIL/uL — ABNORMAL LOW (ref 4.22–5.81)
RDW: 14.3 % (ref 11.5–15.5)
WBC: 8.1 10*3/uL (ref 4.0–10.5)

## 2013-03-12 LAB — GLUCOSE, CAPILLARY
Glucose-Capillary: 165 mg/dL — ABNORMAL HIGH (ref 70–99)
Glucose-Capillary: 200 mg/dL — ABNORMAL HIGH (ref 70–99)
Glucose-Capillary: 194 mg/dL — ABNORMAL HIGH (ref 70–99)

## 2013-03-12 NOTE — Progress Notes (Signed)
[facility] Progress Note    Since last GI note: Ate low salt solids last night without n/v or pains.  Last BM last night was brown.  No overt GI bleeding.   Objective: Vital signs in last 24 hours: Temp:  [98.9 F (37.2 C)-99.8 F (37.7 C)] 98.9 F (37.2 C) (11/22 0535) Pulse Rate:  [75-87] 75 (11/22 0535) Resp:  [18-22] 18 (11/22 0535) BP: (95-126)/(48-71) 95/54 mmHg (11/22 0535) SpO2:  [95 %-98 %] 98 % (11/22 0535) Weight:  [185 lb 4.8 oz (84.052 kg)] 185 lb 4.8 oz (84.052 kg) (11/22 4098) Last BM Date: 03/11/13 General: alert and oriented times 3 Heart: regular rate and rythm Abdomen: soft, non-tender, non-distended, normal bowel sounds   Lab Results:  Recent Labs  03/11/13 0255 03/11/13 1904 03/12/13 0406  WBC 8.6 8.0 8.1  HGB 9.7*  9.8* 8.9* 8.9*  PLT 66* 62* 61*  MCV 93.0 93.4 94.0    Recent Labs  03/09/13 2039 03/10/13 0852 03/11/13 0255  NA 141 140 138  K 5.3* 4.4 3.6  CL 108 107 105  CO2  --  22 23  GLUCOSE 200* 207* 215*  BUN 37* 31* 21  CREATININE 1.00 0.70 0.68  CALCIUM  --  8.7 8.5    Recent Labs  03/09/13 2022 03/10/13 0852 03/11/13 0255  PROT 6.3 5.8* 5.8*  ALBUMIN 3.0* 2.7* 2.9*  AST 65* 147* 179*  ALT 69* 151* 219*  ALKPHOS 81 57 59  BILITOT 1.8* 0.9 0.9  BILIDIR 0.5*  --   --   IBILI 1.3*  --   --     Recent Labs  03/10/13 0852 03/11/13 0255  INR 1.55* 1.52*     Studies/Results: No results found.   Medications: Scheduled Meds: . insulin aspart  0-5 Units Subcutaneous QHS  . insulin aspart  0-9 Units Subcutaneous TID WC  . pantoprazole  40 mg Oral BID  . sodium chloride  1,000 mL Intravenous Once  . sodium chloride  1,000 mL Intravenous Once  . sodium chloride  3 mL Intravenous Q12H   Continuous Infusions: . sodium chloride 10 mL/hr (03/11/13 0937)  . octreotide (SANDOSTATIN) infusion 50 mcg/hr (03/12/13 0121)   PRN Meds:.HYDROcodone-acetaminophen, morphine injection, ondansetron (ZOFRAN) IV,  ondansetron    Assessment/Plan: 62 y.o. male with newly diagnosed cirrhosis, resolved variceal bleeding s/p ligating bands 2 days ago  Will d/c octreotide drip now. He should continue PPI (OTC omeprazole will work), twice daily. We have already set him up with follow up appt with me in office in 2-3 weeks. Will discuss hepatoma screening, will plan to repeat variceal ligation, will consider colon cancer screening, will immunize for Hep A and B. He does not have ascites on exam, CT or edema in legs so no need for diuretics currently.  If no further bleeding, he is safe for d/c later this afternoon or tomorrow morning.  He should maintain a low salt diet and avoid NSAIDs as best that he can.  Rachael Fee, MD  03/12/2013, 7:10 AM  Gastroenterology Pager [phone]

## 2013-03-12 NOTE — Progress Notes (Signed)
PATIENT DETAILS Name: Justin Scott Age: 62 y.o. Sex: male Date of Birth: 12/23/1950 Admit Date: 03/09/2013 Admitting Physician Lynden Oxford, MD PCP:No primary provider on file.  Subjective: No major issues overnight.  Assessment/Plan: Active Problems: Acute upper GI bleed - Secondary to bleeding esophageal varices - EGD on 11/20 revealed varices and portal gastropathy- status post banding procedure - On admission required octreotide infusion, currently no further bleeding post EGD. Seen by gastroenterology, likely discharge on 11/23. - Continue with PPI  Acute blood loss - Has required 3 units of PRBC transfusion, hemoglobin currently stable at 8.9.  Cirrhosis of liver - New diagnoses - Reviewed gastroenterology notes, further workup to be done in the outpatient setting. Followup already arranged for 04/08/13  - Low-salt diet.  Transaminitis - Stable, further workup deferred to the outpatient setting. Hepatitis serology panel negative.  Diabetes - CBGs stable, continue SSRI, resume metformin on discharge  Possible rectal wall thickening  -seen on CT-defer to GI- likely will need further followup in the outpatient setting  Disposition: Remain inpatient  DVT Prophylaxis: SCD's  Code Status: Full code   Family Communication None at bedside.  Procedures:  EGD with banding on 11/20  CONSULTS:  GI   MEDICATIONS: Scheduled Meds: . insulin aspart  0-5 Units Subcutaneous QHS  . insulin aspart  0-9 Units Subcutaneous TID WC  . pantoprazole  40 mg Oral BID  . sodium chloride  1,000 mL Intravenous Once  . sodium chloride  1,000 mL Intravenous Once  . sodium chloride  3 mL Intravenous Q12H   Continuous Infusions: . sodium chloride 10 mL/hr (03/11/13 0937)   PRN Meds:.HYDROcodone-acetaminophen, morphine injection, ondansetron (ZOFRAN) IV, ondansetron  Antibiotics: Anti-infectives   Start     Dose/Rate Route Frequency Ordered Stop   03/10/13 0300   cefTRIAXone (ROCEPHIN) 2 g in dextrose 5 % 50 mL IVPB  Status:  Discontinued     2 g 100 mL/hr over 30 Minutes Intravenous Daily at bedtime 03/10/13 0203 03/10/13 0852   03/09/13 2245  piperacillin-tazobactam (ZOSYN) IVPB 3.375 g     3.375 g 12.5 mL/hr over 240 Minutes Intravenous  Once 03/09/13 2238 03/10/13 0013       PHYSICAL EXAM: Vital signs in last 24 hours: Filed Vitals:   03/11/13 2055 03/12/13 0535 03/12/13 0652 03/12/13 1417  BP: 110/61 95/54  126/71  Pulse: 80 75  77  Temp: 99.6 F (37.6 C) 98.9 F (37.2 C)  99.1 F (37.3 C)  TempSrc: Oral Oral  Oral  Resp: 18 18  18   Height: 5\' 6"  (1.676 m)     Weight:   84.052 kg (185 lb 4.8 oz)   SpO2: 95% 98%  98%    Weight change:  Filed Weights   03/10/13 0200 03/10/13 0443 03/12/13 0652  Weight: 83.008 kg (183 lb) 83.001 kg (182 lb 15.7 oz) 84.052 kg (185 lb 4.8 oz)   Body mass index is 29.92 kg/(m^2).   Gen Exam: Awake and alert with clear speech.   Neck: Supple, No JVD.   Chest: B/L Clear.   CVS: S1 S2 Regular, no murmurs.  Abdomen: soft, BS +, non tender, non distended.  Extremities: no edema, lower extremities warm to touch. Neurologic: Non Focal.   Skin: No Rash.   Wounds: N/A.   Intake/Output from previous day:  Intake/Output Summary (Last 24 hours) at 03/12/13 1514 Last data filed at 03/12/13 1418  Gross per 24 hour  Intake   1673 ml  Output  952 ml  Net    721 ml     LAB RESULTS: CBC  Recent Labs Lab 03/09/13 2022  03/10/13 0345 03/10/13 0852 03/10/13 2100 03/11/13 0255 03/11/13 1904 03/12/13 0406  WBC 12.8*  --  11.6* 10.0  --  8.6 8.0 8.1  HGB 9.8*  < > 8.2* 8.4* 9.7* 9.7*  9.8* 8.9* 8.9*  HCT 29.6*  < > 23.8* 24.7* 27.8* 28.0*  27.7* 25.5* 25.1*  PLT 131*  --  107* 103*  --  66* 62* 61*  MCV 98.7  --  96.4 96.9  --  93.0 93.4 94.0  MCH 32.7  --  33.2 32.9  --  32.9 32.6 33.3  MCHC 33.1  --  34.5 34.0  --  35.4 34.9 35.5  RDW 13.6  --  13.4 13.5  --  14.8 14.5 14.3  LYMPHSABS  1.1  --  2.0  --   --   --   --   --   MONOABS 1.7*  --  1.5*  --   --   --   --   --   EOSABS 0.0  --  0.0  --   --   --   --   --   BASOSABS 0.0  --  0.0  --   --   --   --   --   < > = values in this interval not displayed.  Chemistries   Recent Labs Lab 03/09/13 2039 03/10/13 0852 03/11/13 0255  NA 141 140 138  K 5.3* 4.4 3.6  CL 108 107 105  CO2  --  22 23  GLUCOSE 200* 207* 215*  BUN 37* 31* 21  CREATININE 1.00 0.70 0.68  CALCIUM  --  8.7 8.5    CBG:  Recent Labs Lab 03/11/13 1646 03/11/13 1951 03/11/13 2159 03/12/13 0752 03/12/13 1122  GLUCAP 212* 234* 160* 165* 200*    GFR Estimated Creatinine Clearance: 101.1 ml/min (by C-G formula based on Cr of 0.68).  Coagulation profile  Recent Labs Lab 03/09/13 2022 03/10/13 0852 03/11/13 0255  INR 1.61* 1.55* 1.52*    Cardiac Enzymes No results found for this basename: CK, CKMB, TROPONINI, MYOGLOBIN,  in the last 168 hours  No components found with this basename: POCBNP,  No results found for this basename: DDIMER,  in the last 72 hours No results found for this basename: HGBA1C,  in the last 72 hours  Recent Labs  03/12/13 0406  CHOL 81  HDL 18*  LDLCALC 44  TRIG 94  CHOLHDL 4.5   No results found for this basename: TSH, T4TOTAL, FREET3, T3FREE, THYROIDAB,  in the last 72 hours No results found for this basename: VITAMINB12, FOLATE, FERRITIN, TIBC, IRON, RETICCTPCT,  in the last 72 hours No results found for this basename: LIPASE, AMYLASE,  in the last 72 hours  Urine Studies No results found for this basename: UACOL, UAPR, USPG, UPH, UTP, UGL, UKET, UBIL, UHGB, UNIT, UROB, ULEU, UEPI, UWBC, URBC, UBAC, CAST, CRYS, UCOM, BILUA,  in the last 72 hours  MICROBIOLOGY: Recent Results (from the past 240 hour(s))  CULTURE, BLOOD (ROUTINE X 2)     Status: None   Collection Time    03/09/13 11:14 PM      Result Value Range Status   Specimen Description BLOOD RIGHT ARM   Final   Special Requests  BOTTLES DRAWN AEROBIC AND ANAEROBIC 10CC EACH   Final   Culture  Setup Time  Final   Value: 03/10/2013 04:45     Performed at [facility]   Culture     Final   Value:        BLOOD CULTURE RECEIVED NO GROWTH TO DATE CULTURE WILL BE HELD FOR 5 DAYS BEFORE ISSUING A FINAL NEGATIVE REPORT     Performed at [facility]   Report Status PENDING   Incomplete  URINE CULTURE     Status: None   Collection Time    03/09/13 11:18 PM      Result Value Range Status   Specimen Description URINE, CLEAN CATCH   Final   Special Requests NONE   Final   Culture  Setup Time     Final   Value: 03/10/2013 05:13     Performed at [facility] Count     Final   Value: NO GROWTH     Performed at [facility]   Culture     Final   Value: NO GROWTH     Performed at [facility]   Report Status 03/11/2013 FINAL   Final  CULTURE, BLOOD (ROUTINE X 2)     Status: None   Collection Time    03/09/13 11:23 PM      Result Value Range Status   Specimen Description BLOOD RIGHT HAND   Final   Special Requests BOTTLES DRAWN AEROBIC ONLY 10CC   Final   Culture  Setup Time     Final   Value: 03/10/2013 04:44     Performed at [facility]   Culture     Final   Value:        BLOOD CULTURE RECEIVED NO GROWTH TO DATE CULTURE WILL BE HELD FOR 5 DAYS BEFORE ISSUING A FINAL NEGATIVE REPORT     Performed at [facility]   Report Status PENDING   Incomplete  MRSA PCR SCREENING     Status: None   Collection Time    03/10/13  2:06 AM      Result Value Range Status   MRSA by PCR NEGATIVE  NEGATIVE Final   Comment:            The GeneXpert MRSA Assay (FDA     approved for NASAL specimens     only), is one component of a     comprehensive MRSA colonization     surveillance program. It is not     intended to diagnose MRSA     infection nor to guide or     monitor treatment for     MRSA infections.    RADIOLOGY STUDIES/RESULTS: Ct Abdomen Pelvis W  Contrast  03/09/2013   CLINICAL DATA:  Gastrointestinal bleeding.  Diabetes.  EXAM: CT ABDOMEN AND PELVIS WITH CONTRAST  TECHNIQUE: Multidetector CT imaging of the abdomen and pelvis was performed using the standard protocol following bolus administration of intravenous contrast.  CONTRAST:  100 cc Omnipaque 300  COMPARISON:  None.  FINDINGS: Is distal esophageal contrast medium suggest gastroesophageal reflux. Borderline distal esophageal wall thickening.  Numerous small solid-appearing lesions are scattered throughout the liver. Prominently nodular hepatic contour compatible with cirrhosis. Upper normal spleen size. Portal vein and splenic vein patent. Scattered small varices along the gastrohepatic ligament and tracking adjacent to the distal esophagus.  Pancreas and adrenal glands unremarkable. Kidneys and proximal ureters unremarkable.  Small retroperitoneal lymph nodes are not pathologically enlarged by size criteria. A portacaval node measures 1.2 cm in short  axis. No pathologic pelvic adenopathy. Moderately prominent prostate gland at 5.3 x 3.5 cm.  Possible wall thickening in the rectum on images 71 through 79 of series 2.  Bridging spurring of the sacroiliac joints.  Lumbar spondylosis and degenerative disc disease.  IMPRESSION: 1. Prominent hepatic cirrhosis, with scattered complex lesions throughout the liver. Malignancy is not excluded. I recommend a follow-up non urgent hepatic protocol MRI for further characterization of these scattered liver lesions. 2. Mild wall thickening in the rectum may be incidental, but rectal tumor is not readily excluded. 3. Atherosclerosis. 4. Portal venous hypertension. 5. Gastroesophageal reflux. 6. Mildly prominent prostate gland. 7. Lumbar spondylosis and degenerative disc disease.   Electronically Signed   By: Herbie Baltimore M.D.   On: 03/09/2013 23:22   Dg Chest Portable 1 View  03/09/2013   CLINICAL DATA:  Spitting up blood, GI bleed  EXAM: PORTABLE CHEST - 1  VIEW  COMPARISON:  None.  FINDINGS: Borderline enlarged cardiac silhouette and mediastinal contours, possibly attributable to projection and decreased lung volumes. There is mild diffuse slightly nodular thickening of the pulmonary interstitium. A punctate granuloma overlies the peripheral aspect of the right upper long. Minimal left basilar opacities favored to represent atelectasis. No discrete focal airspace opacities. No definite pleural effusion or pneumothorax. No evidence of edema. Suspected old/healed left-sided lateral sixth rib fracture.  IMPRESSION: Bronchitic change and left basilar atelectasis without definite acute cardiopulmonary disease on this AP portable examination. Further evaluation with a PA and lateral chest radiograph may be obtained as clinically indicated.   Electronically Signed   By: Simonne Come M.D.   On: 03/09/2013 19:42    Jeoffrey Massed, MD  [facility] Pager:[phone]  If 7PM-7AM, please contact night-coverage [email or web address] Password [email or web address] 03/12/2013, 3:14 PM   LOS: 3 days

## 2013-03-13 LAB — HEMOGLOBIN AND HEMATOCRIT, BLOOD
HCT: 25.8 % — ABNORMAL LOW (ref 39.0–52.0)
Hemoglobin: 9 g/dL — ABNORMAL LOW (ref 13.0–17.0)

## 2013-03-13 LAB — GLUCOSE, CAPILLARY: Glucose-Capillary: 163 mg/dL — ABNORMAL HIGH (ref 70–99)

## 2013-03-13 LAB — HEMOGLOBIN A1C: Hgb A1c MFr Bld: 6.1 % — ABNORMAL HIGH (ref <5.7)

## 2013-03-13 MED ORDER — PANTOPRAZOLE SODIUM 40 MG PO TBEC: 40.0000 mg | DELAYED_RELEASE_TABLET | Freq: Two times a day (BID) | ORAL | Status: DC

## 2013-03-13 NOTE — Discharge Summary (Signed)
PATIENT DETAILS Name: Justin Scott Age: 62 y.o. Sex: male Date of Birth: May 05, 1950 MRN: [id number]. Admit Date: 03/09/2013 Admitting Physician: Lynden Oxford, MD WUJ:[id number],NWGNFA Joelene Millin, MD  Recommendations for Outpatient Follow-up:  1. Please check CBC at next visit.  PRIMARY DISCHARGE DIAGNOSIS:  Active Problems:   Cirrhosis of liver without mention of alcohol   Acute upper GI bleed   Diabetes mellitus, type II   Esophageal varices with bleeding   Acute blood loss anemia   Transaminitis   Thrombocytopenia   Lactic acidosis      PAST MEDICAL HISTORY: Past Medical History  Diagnosis Date  . Diabetes mellitus without complication     DISCHARGE MEDICATIONS:   Medication List         metFORMIN 1000 MG tablet  Commonly known as:  GLUCOPHAGE  Take 500-1,000 mg by mouth 2 (two) times daily. Takes 500mg  in morning and 1000mg  in evening     pantoprazole 40 MG tablet  Commonly known as:  PROTONIX  Take 1 tablet (40 mg total) by mouth 2 (two) times daily.        ALLERGIES:  No Known Allergies  BRIEF HPI:  See H&P, Labs, Consult and Test reports for all details in brief, patient is a 62 year old male with a past medical history of diabetes, who presented with black artery stools of 2 days duration. He was then admitted for further evaluation and treatment.  CONSULTATIONS:   GI  PERTINENT RADIOLOGIC STUDIES: Ct Abdomen Pelvis W Contrast  03/09/2013   CLINICAL DATA:  Gastrointestinal bleeding.  Diabetes.  EXAM: CT ABDOMEN AND PELVIS WITH CONTRAST  TECHNIQUE: Multidetector CT imaging of the abdomen and pelvis was performed using the standard protocol following bolus administration of intravenous contrast.  CONTRAST:  100 cc Omnipaque 300  COMPARISON:  None.  FINDINGS: Is distal esophageal contrast medium suggest gastroesophageal reflux. Borderline distal esophageal wall thickening.  Numerous small solid-appearing lesions are scattered throughout the liver.  Prominently nodular hepatic contour compatible with cirrhosis. Upper normal spleen size. Portal vein and splenic vein patent. Scattered small varices along the gastrohepatic ligament and tracking adjacent to the distal esophagus.  Pancreas and adrenal glands unremarkable. Kidneys and proximal ureters unremarkable.  Small retroperitoneal lymph nodes are not pathologically enlarged by size criteria. A portacaval node measures 1.2 cm in short axis. No pathologic pelvic adenopathy. Moderately prominent prostate gland at 5.3 x 3.5 cm.  Possible wall thickening in the rectum on images 71 through 79 of series 2.  Bridging spurring of the sacroiliac joints.  Lumbar spondylosis and degenerative disc disease.  IMPRESSION: 1. Prominent hepatic cirrhosis, with scattered complex lesions throughout the liver. Malignancy is not excluded. I recommend a follow-up non urgent hepatic protocol MRI for further characterization of these scattered liver lesions. 2. Mild wall thickening in the rectum may be incidental, but rectal tumor is not readily excluded. 3. Atherosclerosis. 4. Portal venous hypertension. 5. Gastroesophageal reflux. 6. Mildly prominent prostate gland. 7. Lumbar spondylosis and degenerative disc disease.   Electronically Signed   By: Herbie Baltimore M.D.   On: 03/09/2013 23:22   Dg Chest Portable 1 View  03/09/2013   CLINICAL DATA:  Spitting up blood, GI bleed  EXAM: PORTABLE CHEST - 1 VIEW  COMPARISON:  None.  FINDINGS: Borderline enlarged cardiac silhouette and mediastinal contours, possibly attributable to projection and decreased lung volumes. There is mild diffuse slightly nodular thickening of the pulmonary interstitium. A punctate granuloma overlies the peripheral aspect of the right upper long. Minimal left  basilar opacities favored to represent atelectasis. No discrete focal airspace opacities. No definite pleural effusion or pneumothorax. No evidence of edema. Suspected old/healed left-sided lateral  sixth rib fracture.  IMPRESSION: Bronchitic change and left basilar atelectasis without definite acute cardiopulmonary disease on this AP portable examination. Further evaluation with a PA and lateral chest radiograph may be obtained as clinically indicated.   Electronically Signed   By: Simonne Come M.D.   On: 03/09/2013 19:42     PERTINENT LAB RESULTS: CBC:  Recent Labs  03/11/13 1904 03/12/13 0406 03/13/13 0338  WBC 8.0 8.1  --   HGB 8.9* 8.9* 9.0*  HCT 25.5* 25.1* 25.8*  PLT 62* 61*  --    CMET CMP     Component Value Date/Time   NA 138 03/11/2013 0255   K 3.6 03/11/2013 0255   CL 105 03/11/2013 0255   CO2 23 03/11/2013 0255   GLUCOSE 215* 03/11/2013 0255   BUN 21 03/11/2013 0255   CREATININE 0.68 03/11/2013 0255   CALCIUM 8.5 03/11/2013 0255   PROT 5.8* 03/11/2013 0255   ALBUMIN 2.9* 03/11/2013 0255   AST 179* 03/11/2013 0255   ALT 219* 03/11/2013 0255   ALKPHOS 59 03/11/2013 0255   BILITOT 0.9 03/11/2013 0255   GFRNONAA >90 03/11/2013 0255   GFRAA >90 03/11/2013 0255    GFR Estimated Creatinine Clearance: 101.1 ml/min (by C-G formula based on Cr of 0.68). No results found for this basename: LIPASE, AMYLASE,  in the last 72 hours No results found for this basename: CKTOTAL, CKMB, CKMBINDEX, TROPONINI,  in the last 72 hours No components found with this basename: POCBNP,  No results found for this basename: DDIMER,  in the last 72 hours No results found for this basename: HGBA1C,  in the last 72 hours  Recent Labs  03/12/13 0406  CHOL 81  HDL 18*  LDLCALC 44  TRIG 94  CHOLHDL 4.5   No results found for this basename: TSH, T4TOTAL, FREET3, T3FREE, THYROIDAB,  in the last 72 hours No results found for this basename: VITAMINB12, FOLATE, FERRITIN, TIBC, IRON, RETICCTPCT,  in the last 72 hours Coags:  Recent Labs  03/11/13 0255  INR 1.52*   Microbiology: Recent Results (from the past 240 hour(s))  CULTURE, BLOOD (ROUTINE X 2)     Status: None    Collection Time    03/09/13 11:14 PM      Result Value Range Status   Specimen Description BLOOD RIGHT ARM   Final   Special Requests BOTTLES DRAWN AEROBIC AND ANAEROBIC 10CC EACH   Final   Culture  Setup Time     Final   Value: 03/10/2013 04:45     Performed at [facility]   Culture     Final   Value:        BLOOD CULTURE RECEIVED NO GROWTH TO DATE CULTURE WILL BE HELD FOR 5 DAYS BEFORE ISSUING A FINAL NEGATIVE REPORT     Performed at [facility]   Report Status PENDING   Incomplete  URINE CULTURE     Status: None   Collection Time    03/09/13 11:18 PM      Result Value Range Status   Specimen Description URINE, CLEAN CATCH   Final   Special Requests NONE   Final   Culture  Setup Time     Final   Value: 03/10/2013 05:13     Performed at [facility] Count  Final   Value: NO GROWTH     Performed at [facility]   Culture     Final   Value: NO GROWTH     Performed at [facility]   Report Status 03/11/2013 FINAL   Final  CULTURE, BLOOD (ROUTINE X 2)     Status: None   Collection Time    03/09/13 11:23 PM      Result Value Range Status   Specimen Description BLOOD RIGHT HAND   Final   Special Requests BOTTLES DRAWN AEROBIC ONLY 10CC   Final   Culture  Setup Time     Final   Value: 03/10/2013 04:44     Performed at [facility]   Culture     Final   Value:        BLOOD CULTURE RECEIVED NO GROWTH TO DATE CULTURE WILL BE HELD FOR 5 DAYS BEFORE ISSUING A FINAL NEGATIVE REPORT     Performed at [facility]   Report Status PENDING   Incomplete  MRSA PCR SCREENING     Status: None   Collection Time    03/10/13  2:06 AM      Result Value Range Status   MRSA by PCR NEGATIVE  NEGATIVE Final   Comment:            The GeneXpert MRSA Assay (FDA     approved for NASAL specimens     only), is one component of a     comprehensive MRSA colonization     surveillance program. It is not     intended to diagnose MRSA      infection nor to guide or     monitor treatment for     MRSA infections.     BRIEF HOSPITAL COURSE:   Active Problems: Acute upper GI bleed  - Secondary to bleeding esophageal varices. Patient was admitted to a step down unit, started on octreotide and PPI. Gastroenterology was consulted for EGD.  - EGD on 11/20 revealed varices and portal gastropathy- status post banding procedure  - On admission required octreotide infusion, currently no further bleeding post EGD, has had brown stools yesterday. Octreotide infusion has been discontinued, and recommendations from gastroenterology are to stay on a PPI twice daily. Patient has also been asked to avoid nonsteroidal anti-inflammatory medications, and stay on a low salt diet.  Acute blood loss anemia - Has required 3 units of PRBC transfusion, hemoglobin currently stable at 9.0 on discharge.  Cirrhosis of liver  - New diagnoses  - Reviewed gastroenterology notes, further workup to be done in the outpatient setting. Followup already arranged for 04/08/13. Hepatitis serology-acute-negative. - Low-salt diet.   Transaminitis  - Stable, further workup deferred to the outpatient setting. Hepatitis serology panel negative.   Diabetes  - CBGs stable, continue SSRI, resume metformin on discharge   Possible rectal wall thickening  -seen on CT-defer to GI- likely will need further followup in the outpatient setting  TODAY-DAY OF DISCHARGE:  Subjective:   Justin Scott today has no headache,no chest abdominal pain,no new weakness tingling or numbness, feels much better wants to go home today.  Objective:   Blood pressure 132/77, pulse 72, temperature 98.4 F (36.9 C), temperature source Oral, resp. rate 18, height 5\' 6"  (1.676 m), weight 84.052 kg (185 lb 4.8 oz), SpO2 97.00%.  Intake/Output Summary (Last 24 hours) at 03/13/13 1056 Last data filed at 03/13/13 0730  Gross per 24 hour  Intake  480 ml  Output    601 ml  Net   -121  ml   Filed Weights   03/10/13 0200 03/10/13 0443 03/12/13 0652  Weight: 83.008 kg (183 lb) 83.001 kg (182 lb 15.7 oz) 84.052 kg (185 lb 4.8 oz)    Exam Awake Alert, Oriented *3, No new F.N deficits, Normal affect [place].AT,PERRAL Supple Neck,No JVD, No cervical lymphadenopathy appriciated.  Symmetrical Chest wall movement, Good air movement bilaterally, CTAB RRR,No Gallops,Rubs or new Murmurs, No Parasternal Heave +ve B.Sounds, Abd Soft, Non tender, No organomegaly appriciated, No rebound -guarding or rigidity. No Cyanosis, Clubbing or edema, No new Rash or bruise  DISCHARGE CONDITION: Stable  DISPOSITION: Home  DISCHARGE INSTRUCTIONS:    Activity:  As tolerated with Full fall precautions use walker/cane & assistance as needed  Diet recommendation: Heart Healthy diet       Discharge Orders   Future Appointments Provider Department Dept Phone   04/08/2013 2:30 PM Rachael Fee, MD [facility] [phone]   Future Orders Complete By Expires   Call MD for:  persistant nausea and vomiting  As directed    Call MD for:  As directed    Scheduling Instructions:     If black stools recur or if you have vomit blood   Diet - low sodium heart healthy  As directed    Increase activity slowly  As directed       Follow-up Information   Follow up with Rachael Fee, MD On 04/08/2013. (2:30 PM. to follow up liver disease and recent bleeding )    Specialty:  Gastroenterology   Contact information:   [address] [place] [address] [phone]       Follow up with Kaleen Mask, MD. Schedule an appointment as soon as possible for a visit in 1 week.   Specialty:  Family Medicine   Contact information:   [address] [place] [address] [phone]      Total Time spent on discharge equals 45 minutes.  SignedJeoffrey Massed 03/13/2013 10:56 AM

## 2013-03-13 NOTE — Progress Notes (Signed)
03/13/13 Patient going home today,IV sites removed. Discharge instructions reviewed with patient.

## 2013-03-13 NOTE — Evaluation (Signed)
Physical Therapy Evaluation Patient Details Name: Justin Scott MRN: [id number] DOB: 12-17-1950 Today's Date: 03/13/2013 Time: 2956-2130 PT Time Calculation (min): 9 min  PT Assessment / Plan / Recommendation History of Present Illness  Patient is a 62 yo male admitted with upper GIB and cirrhosis of liver.  Clinical Impression  Patient presents with decreased balance impacting safety and mobility.  Patient to go to sister's home with 24 hour assist.  Recommend HHPT for continued therapy for balance training.    PT Assessment  All further PT needs can be met in the next venue of care    Follow Up Recommendations  Home health PT;Supervision/Assistance - 24 hour    Does the patient have the potential to tolerate intense rehabilitation      Barriers to Discharge        Equipment Recommendations  None recommended by PT    Recommendations for Other Services     Frequency      Precautions / Restrictions Precautions Precautions: Fall Restrictions Weight Bearing Restrictions: No   Pertinent Vitals/Pain       Mobility  Bed Mobility Bed Mobility: Supine to Sit;Sitting - Scoot to Edge of Bed;Sit to Supine Supine to Sit: 7: Independent Sitting - Scoot to Edge of Bed: 7: Independent Sit to Supine: 7: Independent Transfers Transfers: Sit to Stand;Stand to Sit Sit to Stand: 5: Supervision;From bed Stand to Sit: 5: Supervision;To bed Details for Transfer Assistance: Supervision for balance/safety Ambulation/Gait Ambulation/Gait Assistance: 5: Supervision Ambulation Distance (Feet): 180 Feet Assistive device: None Ambulation/Gait Assistance Details: Verbal cues to move at slower safe speed.  Patient with staggering gait at times, requiring assist to maintain balance.  Gait Pattern: Step-through pattern;Decreased stride length;Lateral trunk lean to right;Trunk flexed    Exercises     PT Diagnosis: Abnormality of gait;Generalized weakness  PT Problem List: Decreased  strength;Decreased balance;Decreased mobility;Decreased knowledge of use of DME PT Treatment Interventions:   F/u at HHPT    PT Goals(Current goals can be found in the care plan section) Acute Rehab PT Goals Patient Stated Goal: To go home  Visit Information  Last PT Received On: 03/13/13 Assistance Needed: +1 History of Present Illness: Patient is a 62 yo male admitted with upper GIB and cirrhosis of liver.       Prior Functioning  Home Living Family/patient expects to be discharged to:: Private residence (Going to sister's home) Living Arrangements: Other relatives (sister and brother-in-law) Available Help at Discharge: Family;Available 24 hours/day Type of Home: House Home Access: Stairs to enter [employer] of Steps: 3 Entrance Stairs-Rails: Right;Left Home Layout: One level Home Equipment: Cane - single point Prior Function Level of Independence: Independent Communication Communication: No difficulties    Cognition  Cognition Arousal/Alertness: Awake/alert Behavior During Therapy: Flat affect Overall Cognitive Status: No family/caregiver present to determine baseline cognitive functioning    Extremity/Trunk Assessment Upper Extremity Assessment Upper Extremity Assessment: Overall WFL for tasks assessed Lower Extremity Assessment Lower Extremity Assessment: Generalized weakness Cervical / Trunk Assessment Cervical / Trunk Assessment: Normal   Balance Balance Balance Assessed: Yes High Level Balance High Level Balance Activites: Turns;Sudden stops;Head turns High Level Balance Comments: Slight decrease in balance with high level balance activities  End of Session PT - End of Session Equipment Utilized During Treatment: Gait belt Activity Tolerance: Patient tolerated treatment well Patient left: in bed;with call bell/phone within reach Nurse Communication: Mobility status  GP     Vena Austria 03/13/2013, 12:11 PM Durenda Hurt. Earlene Plater, PT, MBA Acute  [place] Pager [phone]

## 2013-03-13 NOTE — Progress Notes (Signed)
   CARE MANAGEMENT NOTE 03/13/2013  Patient:  Justin Scott, Justin Scott   Account Number:  [id number]  Date Initiated:  03/13/2013  Documentation initiated by:  [facility]  Subjective/Objective Assessment:   adm: 2 days he has been having black color bowel movements     Action/Plan:   discharge planning   Anticipated DC Date:  03/13/2013   Anticipated DC Plan:  HOME W HOME HEALTH SERVICES      DC Planning Services  CM consult      Choice offered to / List presented to:  C-5 Sibling        HH arranged  HH-2 PT      [facility] agency  [facility]   Status of service:  Completed, signed off Medicare Important Message given?   (If response is "NO", the following Medicare IM given date fields will be blank) Date Medicare IM given:   Date Additional Medicare IM given:    Discharge Disposition:  HOME W HOME HEALTH SERVICES  Per UR Regulation:    If discussed at Long Length of Stay Meetings, dates discussed:    Comments:  03/13/13 14:30 CM spoke to sister of pt, Pati Gallo [phone] who watches out for her brother concerning HHPT.  Steward Drone states pt will be going home with her and please call her when [facility] is decided so she can have pt home.  Correct contact number given to [facility] with explanation of where pt will be for HHPT.  Referral faxed to [facility] with correct contact and number.  No other CM needs were communicated.  Freddy Jaksch, BSN, CM [phone].

## 2013-03-16 LAB — CULTURE, BLOOD (ROUTINE X 2): Culture: NO GROWTH

## 2013-04-08 ENCOUNTER — Ambulatory Visit (INDEPENDENT_AMBULATORY_CARE_PROVIDER_SITE_OTHER): Payer: BC Managed Care – PPO | Admitting: Gastroenterology

## 2013-04-08 ENCOUNTER — Encounter: Payer: Self-pay | Admitting: Gastroenterology

## 2013-04-08 ENCOUNTER — Telehealth: Payer: Self-pay | Admitting: Gastroenterology

## 2013-04-08 ENCOUNTER — Other Ambulatory Visit (INDEPENDENT_AMBULATORY_CARE_PROVIDER_SITE_OTHER): Payer: BC Managed Care – PPO

## 2013-04-08 VITALS — BP 138/70 | HR 56 | Ht 66.0 in | Wt 188.0 lb

## 2013-04-08 DIAGNOSIS — K7460 Unspecified cirrhosis of liver: Secondary | ICD-10-CM

## 2013-04-08 LAB — CBC WITH DIFFERENTIAL/PLATELET
Basophils Relative: 0.3 % (ref 0.0–3.0)
Eosinophils Absolute: 0.2 10*3/uL (ref 0.0–0.7)
Hemoglobin: 11.4 g/dL — ABNORMAL LOW (ref 13.0–17.0)
Lymphocytes Relative: 16.1 % (ref 12.0–46.0)
MCHC: 33.7 g/dL (ref 30.0–36.0)
Monocytes Relative: 10.3 % (ref 3.0–12.0)
Neutro Abs: 5 10*3/uL (ref 1.4–7.7)
Neutrophils Relative %: 70.8 % (ref 43.0–77.0)
RBC: 3.7 Mil/uL — ABNORMAL LOW (ref 4.22–5.81)
WBC: 7 10*3/uL (ref 4.5–10.5)

## 2013-04-08 LAB — COMPREHENSIVE METABOLIC PANEL
ALT: 25 U/L (ref 0–53)
AST: 33 U/L (ref 0–37)
Albumin: 3.6 g/dL (ref 3.5–5.2)
BUN: 7 mg/dL (ref 6–23)
CO2: 24 mEq/L (ref 19–32)
Calcium: 9.5 mg/dL (ref 8.4–10.5)
Chloride: 105 mEq/L (ref 96–112)
Creatinine, Ser: 0.7 mg/dL (ref 0.4–1.5)
GFR: 126.4 mL/min (ref >60.00)
Potassium: 3.7 mEq/L (ref 3.5–5.1)
Sodium: 137 mEq/L (ref 135–145)
Total Protein: 7.6 g/dL (ref 6.0–8.3)

## 2013-04-08 LAB — FERRITIN: Ferritin: 28.8 ng/mL (ref 22.0–322.0)

## 2013-04-08 LAB — IBC PANEL
Iron: 38 ug/dL — ABNORMAL LOW (ref 42–165)
Saturation Ratios: 9.7 % — ABNORMAL LOW (ref 20.0–50.0)
Transferrin: 278.6 mg/dL (ref 212.0–360.0)

## 2013-04-08 LAB — HEPATITIS B SURFACE ANTIBODY,QUALITATIVE: Hep B S Ab: NEGATIVE

## 2013-04-08 LAB — PROTIME-INR: INR: 1.5 ratio — ABNORMAL HIGH (ref 0.8–1.0)

## 2013-04-08 LAB — IGA: IgA: 1003 mg/dL — ABNORMAL HIGH (ref 68–378)

## 2013-04-08 LAB — HEPATITIS A ANTIBODY, TOTAL: Hep A Total Ab: NONREACTIVE

## 2013-04-08 MED ORDER — OMEPRAZOLE 40 MG PO CPDR: 40.0000 mg | DELAYED_RELEASE_CAPSULE | Freq: Every day | ORAL | Status: DC

## 2013-04-08 NOTE — Patient Instructions (Addendum)
You will get labs drawn today:  Hepatitis A totatl Ab,  Hepatitis B surface antibody,total iron, ferritin, TIBC, ANA, AMA, , anti smooth muscle antibody, alpha 1 antitrypsin, cerulloplasm, TTG, total IgA level, CBC, CMET, INR.  You will be set up for an MRI of your liver to follow up on ? Lesions noted last month on CT scan (with IV contrast) You will be set up for a colonoscopy for screening, abnormal rectum on CT ([facility] +[facility]) You will be set up for an upper endoscopy for repeat banding of esophageal varices ([facility] +[facility]) Please call to set these appointments up after the new year.  [phone]  It is important that you have a relatively low salt diet.  High salt diet can cause fluid to accumulate in your legs, abdomen and even around your lungs. You should try to avoid NSAID type over the counter pain medicines as best as possible. Tylenol is safe to take for 'routine' aches and pains, but never take more than 1/2 the dose suggested on the package instructions (never more than 2 grams per day). Avoid alcohol. Stop the twice daily protonix. Start once daily OTC omeprazole 20mg  pill (best taken 20-30 min before breakfast). Please return to see Dr. Christella Hartigan in 2-3 months at latest.

## 2013-04-08 NOTE — Telephone Encounter (Signed)
rx has been sent per pt request 

## 2013-04-08 NOTE — Progress Notes (Signed)
Review of pertinent gastrointestinal problems: 1. Cirrhosis diagnosed 2014  CT scan 02/2013: 1. Prominent hepatic cirrhosis, with scattered complex lesions throughout the liver. Malignancy is not excluded. I recommend a follow-up non urgent hepatic protocol MRI for further characterization of these scattered liver lesions. 2. Mild wall thickening in the rectum may be incidental, but rectal tumor is not readily excluded. 3. Atherosclerosis. 4. Portal venous hypertension. 5. Gastroesophageal reflux. 6. Mildly prominent prostate gland. 7. Lumbar spondylosis and degenerative disc disease.  EGD 02/2013 Christella Hartigan:  There were three trunks of small to medium sized distal esophagus varices. None had clear signs of recent bleeding however there was hematin in stomach was well as a small amount of fresh red blood at GE junction. There was moderate portal gastropathy throughout the stomach. I placed 7 variceal ligating bands (one misfired) without immediate complication.  AFP 02/2013 normal  Labs: 02/2014 INR 1.5, Plt low (60s), Hep B S Ag Neg, Hep A Igm total Neg, Hep C ab neg, Hep B C ab neg   HPI: This is a  very pleasant 62 year old man whom I last saw about a month ago when he was hospitalized. He has newly diagnosed cirrhosis of unclear etiology. He was never been alcohol drinker.  He has no hepatitis B. or C. risk factors.  Here with his sister today, she did most of the talking.  Has lost 35 pounds in   No history of liver disease (except Uncle Illene Labrador who was a "drunk" per family).  No hepatitis that he knows of   Past Medical History  Diagnosis Date  . Diabetes mellitus without complication     Past Surgical History  Procedure Laterality Date  . Esophagogastroduodenoscopy N/A 03/10/2013    Procedure: ESOPHAGOGASTRODUODENOSCOPY (EGD);  Surgeon: Rachael Fee, MD;  [facility]: [facility] ENDOSCOPY;  Service: Endoscopy;  Laterality: N/A;    Current Outpatient Prescriptions  Medication Sig Dispense  Refill  . metFORMIN (GLUCOPHAGE) 1000 MG tablet Take 500-1,000 mg by mouth 2 (two) times daily. Takes 500mg  in morning and 1000mg  in evening      . pantoprazole (PROTONIX) 40 MG tablet Take 1 tablet (40 mg total) by mouth 2 (two) times daily.  60 tablet  0   No current facility-administered medications for this visit.    Allergies as of 04/08/2013  . (No Known Allergies)    Family History  Problem Relation Age of Onset  . Diabetes Mother   . Heart disease Father   . Diabetes Father     History   Social History  . Marital Status: Married    Spouse Name: N/A    Number of Children: 0  . Years of Education: N/A   Occupational History  . custodian    Social History Main Topics  . Smoking status: Former Games developer  . Smokeless tobacco: Never Used  . Alcohol Use: No  . Drug Use: No  . Sexual Activity: Not on file   Other Topics Concern  . Not on file   Social History Narrative  . No narrative on file      Physical Exam: BP 138/70  Pulse 56  Ht 5\' 6"  (1.676 m)  Wt 188 lb (85.276 kg)  BMI 30.36 kg/m2 Constitutional: generally well-appearing Psychiatric: alert and oriented x3 Abdomen: soft, nontender, nondistended, no obvious ascites, no peritoneal signs, normal bowel sounds No lower extremity edema    Assessment and plan: 62 y.o. male with cirrhosis, varices, no ascites  Unclear etiology of his cirrhosis. He'll get a  battery of blood tests today to see if we can figure out exactly what was that caused his cirrhosis. His sister tells me today that he used to weigh quite a bit more than he does now. This raises the possibility of fatty liver disease. He also had abnormal imaging of the liver and needs an MRI and followup which I will set up. Needs repeat EGD or repeat variceal banding. He requires a colonoscopy for screening as well as an abnormal rectum noted on CAT scan when he was hospitalized. His sister did most of the talking while he was here today. He has perhaps  some kind of developmental delay as I mentioned he is really processing all information that he is getting. For his sister is helping keep him organized.

## 2013-04-09 LAB — ALPHA-1-ANTITRYPSIN: A-1 Antitrypsin, Ser: 191 mg/dL (ref 90–200)

## 2013-04-09 LAB — CERULOPLASMIN: Ceruloplasmin: 26 mg/dL (ref 20–60)

## 2013-04-11 LAB — TISSUE TRANSGLUTAMINASE, IGA: Tissue Transglutaminase Ab, IgA: 7.7 U/mL (ref <20)

## 2013-04-13 LAB — ANTI-SMOOTH MUSCLE ANTIBODY, IGG: Smooth Muscle Ab: 20 U — ABNORMAL HIGH (ref <20)

## 2013-04-27 ENCOUNTER — Telehealth: Payer: Self-pay

## 2013-04-27 NOTE — Telephone Encounter (Signed)
Message copied by Donata DuffLEWIS, Dale Strausser L on Wed Apr 27, 2013  8:30 AM ------      Message from: Donata DuffLEWIS, Cabe Lashley L      Created: Fri Apr 08, 2013  2:44 PM       Pt needs MRI Colon and EGD [facility] pt will call after the first of the year  ------

## 2013-04-28 MED ORDER — PANTOPRAZOLE SODIUM 40 MG PO TBEC: 40.0000 mg | DELAYED_RELEASE_TABLET | Freq: Every day | ORAL | Status: DC

## 2013-04-28 NOTE — Telephone Encounter (Signed)
OK to call in protonix.    Please encourage him to reconsider the testing.  Also offer ROV in next 5-6 weeks to discuss his cirrhosis.

## 2013-04-28 NOTE — Telephone Encounter (Signed)
Pt has been notified and wants to call back to schedule procedures because of finances.  He also wanted to have protonix sent in instead of omeprazole because it works better.  I have sent this as pt requested.

## 2013-04-29 NOTE — Telephone Encounter (Signed)
Pt was offered appt but he declined he will call when he is ready to discuss

## 2014-04-17 ENCOUNTER — Other Ambulatory Visit: Payer: Self-pay | Admitting: Gastroenterology

## 2015-03-20 ENCOUNTER — Encounter: Payer: Self-pay | Admitting: Gastroenterology

## 2015-04-17 ENCOUNTER — Telehealth: Payer: Self-pay | Admitting: Gastroenterology

## 2015-04-17 NOTE — Telephone Encounter (Signed)
Pt has been scheduled to see Dr Christella HartiganJacobs to discuss.

## 2015-04-18 ENCOUNTER — Other Ambulatory Visit: Payer: Self-pay | Admitting: Gastroenterology

## 2015-05-28 ENCOUNTER — Ambulatory Visit: Payer: BC Managed Care – PPO | Admitting: Gastroenterology

## 2015-12-25 ENCOUNTER — Emergency Department (HOSPITAL_COMMUNITY): Payer: BC Managed Care – PPO

## 2015-12-25 ENCOUNTER — Inpatient Hospital Stay (HOSPITAL_COMMUNITY)
Admission: EM | Admit: 2015-12-25 | Discharge: 2015-12-28 | DRG: 812 | Disposition: A | Discharge status: Home / Self Care | Payer: BC Managed Care – PPO | Attending: Internal Medicine | Admitting: Internal Medicine

## 2015-12-25 ENCOUNTER — Observation Stay (HOSPITAL_COMMUNITY): Payer: BC Managed Care – PPO

## 2015-12-25 ENCOUNTER — Encounter (HOSPITAL_COMMUNITY): Payer: Self-pay

## 2015-12-25 DIAGNOSIS — I4581 Long QT syndrome: Secondary | ICD-10-CM | POA: Diagnosis not present

## 2015-12-25 DIAGNOSIS — H9190 Unspecified hearing loss, unspecified ear: Secondary | ICD-10-CM | POA: Diagnosis present

## 2015-12-25 DIAGNOSIS — K7469 Other cirrhosis of liver: Secondary | ICD-10-CM | POA: Diagnosis not present

## 2015-12-25 DIAGNOSIS — K7460 Unspecified cirrhosis of liver: Secondary | ICD-10-CM

## 2015-12-25 DIAGNOSIS — R9431 Abnormal electrocardiogram [ECG] [EKG]: Secondary | ICD-10-CM

## 2015-12-25 DIAGNOSIS — K219 Gastro-esophageal reflux disease without esophagitis: Secondary | ICD-10-CM | POA: Diagnosis present

## 2015-12-25 DIAGNOSIS — Z8719 Personal history of other diseases of the digestive system: Secondary | ICD-10-CM

## 2015-12-25 DIAGNOSIS — K766 Portal hypertension: Secondary | ICD-10-CM | POA: Diagnosis not present

## 2015-12-25 DIAGNOSIS — I5032 Chronic diastolic (congestive) heart failure: Secondary | ICD-10-CM | POA: Diagnosis present

## 2015-12-25 DIAGNOSIS — Q2733 Arteriovenous malformation of digestive system vessel: Secondary | ICD-10-CM

## 2015-12-25 DIAGNOSIS — K7031 Alcoholic cirrhosis of liver with ascites: Secondary | ICD-10-CM | POA: Diagnosis not present

## 2015-12-25 DIAGNOSIS — E8770 Fluid overload, unspecified: Secondary | ICD-10-CM | POA: Diagnosis present

## 2015-12-25 DIAGNOSIS — R140 Abdominal distension (gaseous): Secondary | ICD-10-CM

## 2015-12-25 DIAGNOSIS — D509 Iron deficiency anemia, unspecified: Secondary | ICD-10-CM | POA: Diagnosis not present

## 2015-12-25 DIAGNOSIS — K2960 Other gastritis without bleeding: Secondary | ICD-10-CM | POA: Diagnosis present

## 2015-12-25 DIAGNOSIS — D649 Anemia, unspecified: Secondary | ICD-10-CM | POA: Diagnosis not present

## 2015-12-25 DIAGNOSIS — R932 Abnormal findings on diagnostic imaging of liver and biliary tract: Secondary | ICD-10-CM | POA: Diagnosis not present

## 2015-12-25 DIAGNOSIS — E669 Obesity, unspecified: Secondary | ICD-10-CM | POA: Diagnosis present

## 2015-12-25 DIAGNOSIS — Z79899 Other long term (current) drug therapy: Secondary | ICD-10-CM

## 2015-12-25 DIAGNOSIS — D638 Anemia in other chronic diseases classified elsewhere: Secondary | ICD-10-CM

## 2015-12-25 DIAGNOSIS — Z87891 Personal history of nicotine dependence: Secondary | ICD-10-CM

## 2015-12-25 DIAGNOSIS — K5900 Constipation, unspecified: Secondary | ICD-10-CM | POA: Diagnosis present

## 2015-12-25 DIAGNOSIS — R188 Other ascites: Secondary | ICD-10-CM | POA: Diagnosis not present

## 2015-12-25 DIAGNOSIS — D696 Thrombocytopenia, unspecified: Secondary | ICD-10-CM | POA: Diagnosis present

## 2015-12-25 DIAGNOSIS — R0602 Shortness of breath: Secondary | ICD-10-CM | POA: Diagnosis not present

## 2015-12-25 DIAGNOSIS — K7290 Hepatic failure, unspecified without coma: Secondary | ICD-10-CM | POA: Diagnosis present

## 2015-12-25 DIAGNOSIS — Z6836 Body mass index (BMI) 36.0-36.9, adult: Secondary | ICD-10-CM

## 2015-12-25 DIAGNOSIS — Z7984 Long term (current) use of oral hypoglycemic drugs: Secondary | ICD-10-CM

## 2015-12-25 DIAGNOSIS — K259 Gastric ulcer, unspecified as acute or chronic, without hemorrhage or perforation: Secondary | ICD-10-CM

## 2015-12-25 DIAGNOSIS — E119 Type 2 diabetes mellitus without complications: Secondary | ICD-10-CM | POA: Diagnosis present

## 2015-12-25 DIAGNOSIS — N5089 Other specified disorders of the male genital organs: Secondary | ICD-10-CM | POA: Diagnosis present

## 2015-12-25 HISTORY — DX: Anemia, unspecified: D64.9

## 2015-12-25 HISTORY — DX: Other ascites: R18.8

## 2015-12-25 LAB — COMPREHENSIVE METABOLIC PANEL
ALBUMIN: 2.8 g/dL — AB (ref 3.5–5.0)
ALT: 15 U/L — AB (ref 17–63)
AST: 36 U/L (ref 15–41)
Alkaline Phosphatase: 84 U/L (ref 38–126)
Anion gap: 5 (ref 5–15)
BUN: 10 mg/dL (ref 6–20)
CHLORIDE: 110 mmol/L (ref 101–111)
CO2: 22 mmol/L (ref 22–32)
CREATININE: 0.77 mg/dL (ref 0.61–1.24)
Calcium: 8.7 mg/dL — ABNORMAL LOW (ref 8.9–10.3)
GFR calc Af Amer: >60 mL/min (ref >60)
GFR calc non Af Amer: >60 mL/min (ref >60)
GLUCOSE: 82 mg/dL (ref 65–99)
POTASSIUM: 4 mmol/L (ref 3.5–5.1)
Sodium: 137 mmol/L (ref 135–145)
Total Bilirubin: 1.2 mg/dL (ref 0.3–1.2)
Total Protein: 7.1 g/dL (ref 6.5–8.1)

## 2015-12-25 LAB — URINALYSIS, ROUTINE W REFLEX MICROSCOPIC
Bilirubin Urine: NEGATIVE
GLUCOSE, UA: NEGATIVE mg/dL
Hgb urine dipstick: NEGATIVE
Ketones, ur: NEGATIVE mg/dL
LEUKOCYTES UA: NEGATIVE
Nitrite: NEGATIVE
PH: 5.5 (ref 5.0–8.0)
PROTEIN: NEGATIVE mg/dL
Specific Gravity, Urine: 1.016 (ref 1.005–1.030)

## 2015-12-25 LAB — CBC WITH DIFFERENTIAL/PLATELET
BASOS ABS: 0 10*3/uL (ref 0.0–0.1)
Basophils Relative: 1 %
EOS PCT: 2 %
Eosinophils Absolute: 0.1 10*3/uL (ref 0.0–0.7)
HCT: 23.2 % — ABNORMAL LOW (ref 39.0–52.0)
Hemoglobin: 6.3 g/dL — CL (ref 13.0–17.0)
LYMPHS ABS: 0.7 10*3/uL (ref 0.7–4.0)
Lymphocytes Relative: 16 %
MCH: 19.1 pg — ABNORMAL LOW (ref 26.0–34.0)
MCHC: 27.2 g/dL — ABNORMAL LOW (ref 30.0–36.0)
MCV: 70.5 fL — ABNORMAL LOW (ref 78.0–100.0)
MONO ABS: 0.7 10*3/uL (ref 0.1–1.0)
MONOS PCT: 16 %
NEUTROS PCT: 65 %
Neutro Abs: 2.9 10*3/uL (ref 1.7–7.7)
PLATELETS: 120 10*3/uL — AB (ref 150–400)
RBC: 3.29 MIL/uL — AB (ref 4.22–5.81)
RDW: 19.1 % — AB (ref 11.5–15.5)
WBC: 4.4 10*3/uL (ref 4.0–10.5)

## 2015-12-25 LAB — ALBUMIN, FLUID (OTHER)

## 2015-12-25 LAB — CBC
HEMATOCRIT: 24 % — AB (ref 39.0–52.0)
HEMOGLOBIN: 6.9 g/dL — AB (ref 13.0–17.0)
MCH: 20.4 pg — AB (ref 26.0–34.0)
MCHC: 28.8 g/dL — AB (ref 30.0–36.0)
MCV: 71 fL — AB (ref 78.0–100.0)
Platelets: 133 10*3/uL — ABNORMAL LOW (ref 150–400)
RBC: 3.38 MIL/uL — ABNORMAL LOW (ref 4.22–5.81)
RDW: 19.7 % — ABNORMAL HIGH (ref 11.5–15.5)
WBC: 4.6 10*3/uL (ref 4.0–10.5)

## 2015-12-25 LAB — GLUCOSE, CAPILLARY
GLUCOSE-CAPILLARY: 99 mg/dL (ref 65–99)
GLUCOSE-CAPILLARY: 84 mg/dL (ref 65–99)

## 2015-12-25 LAB — BODY FLUID CELL COUNT WITH DIFFERENTIAL
Lymphs, Fluid: 75 %
MONOCYTE-MACROPHAGE-SEROUS FLUID: 15 % — AB (ref 50–90)
Neutrophil Count, Fluid: 10 % (ref 0–25)
WBC FLUID: 971 uL (ref 0–1000)

## 2015-12-25 LAB — GRAM STAIN

## 2015-12-25 LAB — LIPASE, BLOOD: LIPASE: 44 U/L (ref 11–51)

## 2015-12-25 LAB — POC OCCULT BLOOD, ED: FECAL OCCULT BLD: NEGATIVE

## 2015-12-25 LAB — PROTEIN, BODY FLUID: Total protein, fluid: <3 g/dL

## 2015-12-25 LAB — BRAIN NATRIURETIC PEPTIDE: B Natriuretic Peptide: 60.9 pg/mL (ref 0.0–100.0)

## 2015-12-25 LAB — PROTIME-INR
INR: 1.65
Prothrombin Time: 19.7 seconds — ABNORMAL HIGH (ref 11.4–15.2)

## 2015-12-25 LAB — PREPARE RBC (CROSSMATCH)

## 2015-12-25 MED ORDER — INSULIN ASPART 100 UNIT/ML ~~LOC~~ SOLN
0.0000 [IU] | Freq: Three times a day (TID) | SUBCUTANEOUS | Status: DC
  Administered 2015-12-26: 1 [IU] via SUBCUTANEOUS
  Administered 2015-12-27: 2 [IU] via SUBCUTANEOUS

## 2015-12-25 MED ORDER — POLYETHYLENE GLYCOL 3350 17 G PO PACK: 17.0000 g | PACK | Freq: Every day | ORAL | Status: DC | PRN

## 2015-12-25 MED ORDER — LIDOCAINE HCL 1 % IJ SOLN
INTRAMUSCULAR | Status: AC
  Filled 2015-12-25: qty 20

## 2015-12-25 MED ORDER — PROPOFOL 1000 MG/100ML IV EMUL
5.0000 ug/kg/min | Freq: Once | INTRAVENOUS | Status: DC
  Filled 2015-12-25: qty 100

## 2015-12-25 MED ORDER — FUROSEMIDE 10 MG/ML IJ SOLN
40.0000 mg | Freq: Once | INTRAMUSCULAR | Status: AC
  Administered 2015-12-25: 40 mg via INTRAVENOUS
  Filled 2015-12-25: qty 4

## 2015-12-25 MED ORDER — SODIUM CHLORIDE 0.9 % IV SOLN
10.0000 mL/h | Freq: Once | INTRAVENOUS | Status: DC
Start: 2015-12-25 — End: 2015-12-25

## 2015-12-25 MED ORDER — PANTOPRAZOLE SODIUM 40 MG PO TBEC
40.0000 mg | DELAYED_RELEASE_TABLET | Freq: Every day | ORAL | Status: DC
  Administered 2015-12-25 – 2015-12-28 (×4): 40 mg via ORAL
  Filled 2015-12-25 (×4): qty 1

## 2015-12-25 MED ORDER — BISACODYL 5 MG PO TBEC
5.0000 mg | DELAYED_RELEASE_TABLET | Freq: Every day | ORAL | Status: DC | PRN
  Administered 2015-12-28: 5 mg via ORAL
  Filled 2015-12-25: qty 1

## 2015-12-25 MED ORDER — ACETAMINOPHEN 500 MG PO TABS: 500.0000 mg | ORAL_TABLET | Freq: Two times a day (BID) | ORAL | Status: DC | PRN

## 2015-12-25 MED ORDER — ALBUMIN HUMAN 25 % IV SOLN
50.0000 g | Freq: Once | INTRAVENOUS | Status: AC
  Administered 2015-12-25: 50 g via INTRAVENOUS
  Filled 2015-12-25: qty 200

## 2015-12-25 MED ORDER — ACETAMINOPHEN 650 MG RE SUPP: 650.0000 mg | Freq: Four times a day (QID) | RECTAL | Status: DC | PRN

## 2015-12-25 NOTE — ED Notes (Signed)
Pt returned from xray

## 2015-12-25 NOTE — H&P (Signed)
History and Physical    Justin PatriciaClifton H Vonada BJY:[id number] DOB: September 23, 1950 DOA: 12/25/2015  PCP: Kaleen MaskELKINS,WILSON OLIVER, MD  Patient coming from:   Home  Chief Complaint:  Shortness of breath, leg swelling  HPI: Justin Scott is a 65 y.o. male with a medical history significant for, but not  limited to, diabetes and cirrhosis complicated by portal hypertension. He was hospitalized November 2014 with upper GI bleeding requiring 3 units of blood. He underwent upper endoscopy with banding of esophageal varices.. Patient saw GI in the office following discharge but then lost to follow up.   Patient presents with progressive shortness of breath, abdominal distention and lower extremity edema over last few days. States he doesn't eat much salt in diet. Not on any diuretics. No abdominal pain. No constipation nor diarrhea.  No blood in stool or black stools.   ED Course:  Patient is afebrile. Mildly tachycardic with heart rate up to 102. BP elevated, 02 sats 100% on r/a.   cxr -mild CHF, small bilateral effusions Wbc 4.4, hemoglobin 6.3, MCV 70, platelets 120. FOBT negative INR 1.65 Albumin 2.8 BNP 60 Rx:  lasix 40 IV. One unit of blood ordered  Review of Systems: As per HPI, otherwise 10 point review of systems negative.    Past Medical History:  Diagnosis Date  . Cirrhosis (HCC)   . Diabetes mellitus without complication [facility](HCC)     Past Surgical History:  Procedure Laterality Date  . ESOPHAGOGASTRODUODENOSCOPY N/A 03/10/2013   Procedure: ESOPHAGOGASTRODUODENOSCOPY (EGD);  Surgeon: Rachael Feeaniel P Jacobs, MD;  [facility];  Service: Endoscopy;  Laterality: N/A;    Social History   Social History  . Marital status: Married    Spouse name: N/A  . Number of children: 0  . Years of education: N/A   Occupational History  . custodian [employer]   Social History Main Topics  . Smoking status: Former Games developermoker  . Smokeless tobacco: Never Used  . Alcohol use No  .  Drug use: No  . Sexual activity: Not on file   Other Topics Concern  . Not on file   Social History Narrative  . No narrative on file   lives alone at home. No assistive devices needed for ambulation. Has a sister who lives locally  No Known Allergies  Family History  Problem Relation Age of Onset  . Diabetes Mother   . Heart disease Father   . Diabetes Father     Prior to Admission medications   Medication Sig Start Date End Date Taking? Authorizing Provider  glimepiride (AMARYL) 4 MG tablet Take 4 mg by mouth daily with breakfast.   Yes Historical Provider, MD  pantoprazole (PROTONIX) 40 MG tablet TAKE 1 TABLET BY MOUTH DAILY. 04/18/15  Yes Rachael Feeaniel P Jacobs, MD  pioglitazone (ACTOS) 30 MG tablet Take 30 mg by mouth daily. 10/24/15  Yes Historical Provider, MD    Physical Exam: Vitals:   12/25/15 1017 12/25/15 1045 12/25/15 1100 12/25/15 1115  BP: 144/81 153/82 145/71 (!) 151/102  Pulse: 90 96 95 96  Resp: 26 23 21 25   Temp:      TempSrc:      SpO2: 100% 100% 100% 100%  Weight:      Height:        Constitutional:  Pleasant obese white male in NAD, calm, comfortable Vitals:   12/25/15 1017 12/25/15 1045 12/25/15 1100 12/25/15 1115  BP: 144/81 153/82 145/71 (!) 151/102  Pulse: 90 96 95 96  Resp: 26 23 21 25   Temp:      TempSrc:      SpO2: 100% 100% 100% 100%  Weight:      Height:       Eyes: PER, lids and conjunctivae pale ENMT: Mucous membranes are moist. Posterior pharynx clear of any exudate or lesions..  Hard of hearing.  Neck: normal, supple, no masses Respiratory: bibasilar crackles. Occas wheeze.  Breathing slightly labored, especially when trying to converse.   Cardiovascular: Regular rate and rhythm, no murmurs. BLE pitting edema, difficult to palpate pedal pulses given edema but feet warm.    Abdomen: largely distended, nontender, bowel sounds positive.  Musculoskeletal: no clubbing / cyanosis. No joint deformity upper and lower extremities. Good ROM,  no contractures. Normal muscle tone.  Skin: no rashes, lesions, ulcers.  Neurologic: CN 2-12 grossly intact. Sensation intact, Strength 5/5 in all 4. No asterixis Psychiatric: Normal judgment and insight. Alert and oriented x 3. Normal mood.   Labs on Admission: I have personally reviewed following labs and imaging studies  Radiological Exams on Admission: Dg Chest 2 View  Result Date: 12/25/2015 CLINICAL DATA:  Cryptogenic cirrhosis, ascites, now with increased abdominal swelling, peripheral edema, wheezing, and shortness of breath for the past 3 days. EXAM: CHEST  2 VIEW COMPARISON:  Portable chest x-ray of March 09, 2013 FINDINGS: The lungs are mildly hypoinflated. The interstitial markings are increased. The cardiac silhouette is enlarged. The central pulmonary vascularity is prominent. There are small pleural effusions blunting the costophrenic angles bilaterally. There is calcification in the wall of the aortic arch. There is mild multilevel degenerative disc disease of the thoracic spine. IMPRESSION: Mild CHF.  Small bilateral pleural effusions. Aortic atherosclerosis Electronically Signed   By: David  [place]iland M.D.   On: 12/25/2015 10:10    EKG: Independently reviewed.   EKG Interpretation  Date/Time:  Tuesday December 25 2015 40:98:11 EDT Ventricular Rate:  106 PR Interval:  128 QRS Duration: 88 QT Interval:  378 QTC Calculation: 502 R Axis:   -19 Text Interpretation:  Sinus tachycardia with Premature atrial complexes Left ventricular hypertrophy Nonspecific ST and T wave abnormality Abnormal ECG No significant changes  Confirmed by LIU MD, Annabelle Harman ([id number]) on 12/25/2015 8:49:16 AM        Assessment/Plan   Active Problems:   Hepatic cirrhosis (HCC)   Diabetes mellitus, type 2 (HCC)   Fluid overload   Prolonged Q-T interval on ECG      Severe symptomatic normocytic anemia, no overt bleeding and heme negative.  Suspect chronic, intermittent GI bleeding from portal HTN, Small  bowel intestinal AVMs, colon neoplasm not excluded.    -Hemodynamically stable. Place in OBS - medical bed       -one unit of blood ordered and in progress -follow up cbc after transfusion.   Cirrhosis, cryptogenic?  Known portal HTN with variceal banding in 2014. No follow up EGD ever done.  No history of ETOH abuse, viral hepatitis studies previously negative. Genetic / autoimmune markers in 2014 pertinent for positive AMA IgG and positive smooth muscle IgG antibody. Patient presents with volume overload (anasarca)  / ascites which is new. Suspect decompensation of his cirrhosis heart failure not totally excluded. -ultrasound guided large volume paracentesis with fluid studies -Will need to start diuretics after LVP. He has received one dose of lasix IV in ED today with output since.  -intake and output -daily weights -2 gram sodium diet -AFP for HCC screening. Of note complex liver lesions seen  on CTscan 2014, liver protocol MRI recommended but apparently didn't get done. Patient lost to GI follow up since 2015, he will need eventual liver imaging for [facility] screening but will defer to GI..  -echocardiogram - no record of one in computer.Need to evaluate for heart failure    DM2. . -hold home oral agents -SSI - sensitive -A1c  Prolonged QTc of 502 -avoid QT prolonged medications. .   DVT prophylaxis:     SCDs   (platelets low normal at 120, patient severely anemic and at increased risk of bleeding .  Code Status:     Full code Family Communication:   none.  Disposition Plan:   Discharge home in 24-48 hours              Consults called:  [facility] Admission status:   Observation - Medical   Willette Cluster NP [facility] Pager [phone]  If 7PM-7AM, please contact night-coverage [email or web address] Password [email or web address]  12/25/2015, 11:39 AM

## 2015-12-25 NOTE — Procedures (Signed)
   US guided RLQ paracentesis      1.1 L Creamy colored fluid removed  Tolerated well Sent for labs per MD

## 2015-12-25 NOTE — ED Notes (Signed)
Admitting at bedside 

## 2015-12-25 NOTE — ED Notes (Signed)
Lab called with critical result for Hgb of 6.3. Dr. Verdie MosherLiu notified.

## 2015-12-25 NOTE — Consult Note (Signed)
[facility] Consult: 3:13 PM 12/25/2015  LOS: 0 days    Referring Provider: Dr Konrad DoloresMerrell  Primary Care Physician:  Kaleen Scott,Justin OLIVER, MD Primary Gastroenterologist:  Dr. Christella HartiganJacobs    Reason for Consultation:  Normocytic anemia.    HPI: Justin Scott is a 65 y.o. male.  Diagnosed with non-alcoholic cirrhosis 2014.  GI bleed, transfused 3 PRBCs with EGD 02/2013: 3 trunks of small to medium sized distal esophagus varices. None had clear signs of recent bleeding however there was hematin in stomach was well as a small amount of fresh red blood at GE junction. There was moderate portal gastropathy throughout the stomach. Dr Christella HartiganJacobs placed 7 variceal ligating bands (one misfired) without immediate complication.  Pt never followed up after 03/2013 GI office visit.  Dr Christella HartiganJacobs had planned to repeat EGD and perform colonoscopy.   Labs 02/2013 with elevated IgA.  Normal TTG, ceruloplasmin, A-1AT, Ferritin.  Negative ANA, Hep B surface Ab,  Hepa A Ab, HCV.   Smooth muscle Ab 20 (norm is <20).  Mitochondrial Ab IgG 1.16 (norm <0.91),  Thrombocytopenia.  Type 2 DM.  GERD.    Presents to ED today c/o dyspnea, LE and abdominal swelling.  The swelling/edema started ~ 1 month ago but worse in last 2 days.  Stable DOE until 2 days ago. 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    Hgb 6.3.  Platelets 120.  MCV 70.  PT/INR 19.7/1.6.  Chemistries normal except for low albumin and low calcium.   CXR with small effusions and mild CHF.    179# in 02/2013.  215# at MD visit in early 09/2015, 237# today.  No blood PR, black stools.  BMs 1 to 2 times per day.  Good appetite.  No nausea. No dysphagia.  No unusual bleeding or bruising. No confusion.  Lives alone, wife is in SNF after CVA. Quit chewing tobacco 1980s  never smoked and never drank ETOH.  Uses 81 ASA and 1 to 2 Aleve every few weeks, none in last 2 weeks.  Not using PPI and is not having reflux sxs.        Past Medical History:  Diagnosis Date  . Anemia 02/2013   transfused PRBC x 3.  bled in setting of cirrhosis, esophageal varices, portal htn.   . Cirrhosis (HCC)   . Diabetes mellitus without complication [facility](HCC)     Past Surgical History:  Procedure Laterality Date  . ESOPHAGOGASTRODUODENOSCOPY N/A 03/10/2013   3 trunks of small to medium sized distal esophagus varices. None had clear signs of recent bleeding however there was hematin in stomach was well as a small amount of fresh red blood at GE junction. There was moderate portal gastropathy throughout the stomach. Dr Christella HartiganJacobs placed 7 variceal ligating bands (one misfired) without immediate complication.    Prior to Admission medications   Medication Sig Start Date End Date Taking? Authorizing Provider  glimepiride (AMARYL) 4 MG tablet Take 4 mg by mouth daily with breakfast.   Yes Historical Provider, MD  pantoprazole (  PROTONIX) 40 MG tablet TAKE 1 TABLET BY MOUTH DAILY. 04/18/15  Yes Justin Fee, MD  pioglitazone (ACTOS) 30 MG tablet Take 30 mg by mouth daily. 10/24/15  Yes Historical Provider, MD    Scheduled Meds: . albumin human  50 g Intravenous Once  . insulin aspart  0-9 Units Subcutaneous TID WC  . pantoprazole  40 mg Oral Daily   Infusions:   PRN Meds: acetaminophen **OR** acetaminophen, bisacodyl, polyethylene glycol   Allergies as of 12/25/2015  . (No Known Allergies)    Family History  Problem Relation Age of Onset  . Diabetes Mother   . Heart disease Father   . Diabetes Father     Social History   Social History  . Marital status: Married    Spouse name: N/A  . Number of children: 0  . 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 Smoking status: Former Games developer  .  Smokeless tobacco: Never Used  . Alcohol use No  . Drug use: No  . Sexual activity: Not on file   Other Topics Concern  . Not on file   Social History Narrative  . No narrative on file    REVIEW OF SYSTEMS: Constitutional:  Per HPI ENT:  No nose bleeds Pulm:  Per HPI.   CV:  No palpitations, no LE edema. No chest pain.   GU:  No hematuria, no frequency GI:  Per HPI Heme:  Per HPI   Transfusions:  In 2014 and now.  None before 2014.  Neuro:  No headaches, no peripheral tingling or numbness.  No dizziness or falls.   Derm:  No itching, no rash or sores.  Endocrine:  No sweats or chills.  No polyuria or dysuria Immunization:  Not queried.  Travel:  None beyond local counties in last few months.    PHYSICAL EXAM: Vital signs in last 24 hours: Vitals:   12/25/15 1216 12/25/15 1304  BP: 138/90 126/86  Pulse: 97 95  Resp: 22 18  Temp: 97.9 F (36.6 C) 98.4 F (36.9 C)   Wt Readings from Last 3 Encounters:  12/25/15 107.7 kg (237 lb 6.4 oz)  04/08/13 85.3 kg (188 lb)  03/13/13 81.6 kg (179 lb 12.8 oz)   General: obese, pleasant, HOH but fully alert Head:  No trauma, asymmetry, swelling  Eyes:  No icterus or pallor Ears:  HOH  Nose:  No discharge or congestion Mouth:  Clear, pink, moist oral MM.  Tongue midline Neck:  No mass, TMG, or JVD. Lungs:  Crackles in both bases.  Audible wheezing even without stethoscope.  Dyspnea at rest.  Heart: RRR.  No mrg Abdomen:  Obese, very tense and protuberant.  Not tender.  Active BS. No HSM, bruits, hernia or masses.   Rectal: deferred.  FOBT per ED exam.     Musc/Skeltl: no joint erythema or gross deformity.   Extremities:  3 to 4 plus LE edema bil from below knees to feet  Neurologic:  No asterixis or tremor.  No limb weakness.  Alert, oriented x 3.   Skin:  No telangectasia, rash, sores or significant purpura or hematoma Tattoos:  none   Psych:  Pleasant, calm, cooperative.   Intake/Output from previous day: No  intake/output data recorded. Intake/Output this shift: Total I/O In: 30 [Blood:30] Out: 1325 [Urine:1325]  LAB RESULTS:  Recent Labs  12/25/15 0931  WBC 4.4  HGB 6.3*  HCT 23.2*  PLT 120*   BMET Lab Results  Component Value Date   NA 137 12/25/2015   NA 137 04/08/2013   NA 138 03/11/2013   K 4.0 12/25/2015   K 3.7 04/08/2013   K 3.6 03/11/2013   CL 110 12/25/2015   CL 105 04/08/2013   CL 105 03/11/2013   CO2 22 12/25/2015   CO2 24 04/08/2013   CO2 23 03/11/2013   GLUCOSE 82 12/25/2015   GLUCOSE 122 (H) 04/08/2013   GLUCOSE 215 (H) 03/11/2013   BUN 10 12/25/2015   BUN 7 04/08/2013   BUN 21 03/11/2013   CREATININE 0.77 12/25/2015   CREATININE 0.7 04/08/2013   CREATININE 0.68 03/11/2013   CALCIUM 8.7 (L) 12/25/2015   CALCIUM 9.5 04/08/2013   CALCIUM 8.5 03/11/2013   LFT  Recent Labs  12/25/15 0931  PROT 7.1  ALBUMIN 2.8*  AST 36  ALT 15*  ALKPHOS 84  BILITOT 1.2   PT/INR Lab Results  Component Value Date   INR 1.65 12/25/2015   INR 1.5 (H) 04/08/2013   INR 1.52 (H) 03/11/2013   Hepatitis Panel No results for input(s): HEPBSAG, HCVAB, HEPAIGM, HEPBIGM in the last 72 hours. C-Diff No components found for: CDIFF Lipase     Component Value Date/Time   LIPASE 44 12/25/2015 0931    Drugs of Abuse  No results found for: LABOPIA, COCAINSCRNUR, LABBENZ, AMPHETMU, THCU, LABBARB   RADIOLOGY STUDIES: Dg Chest 2 View  Result Date: 12/25/2015 CLINICAL DATA:  Cryptogenic cirrhosis, ascites, now with increased abdominal swelling, peripheral edema, wheezing, and shortness of breath for the past 3 days. EXAM: CHEST  2 VIEW COMPARISON:  Portable chest x-ray of March 09, 2013 FINDINGS: The lungs are mildly hypoinflated. The interstitial markings are increased. The cardiac silhouette is enlarged. The central pulmonary vascularity is prominent. There are small pleural effusions blunting the costophrenic angles bilaterally. There is calcification in the wall of  the aortic arch. There is mild multilevel degenerative disc disease of the thoracic spine. IMPRESSION: Mild CHF.  Small bilateral pleural effusions. Aortic atherosclerosis Electronically Signed   By: David  Swaziland M.D.   On: 12/25/2015 10:10    ENDOSCOPIC STUDIES: Per HPI  IMPRESSION:   *  Symptomatic anemia.  FOBT negative.  PRBC x 1 ordered.  S/p banding of non-bleeding esophageal varices 02/2013 after presenting with ABL anemia and GI bleed.  *  Cirrhosis of the liver.  Non-alcoholic.  Labs with mild elevation of smooth muscle and mitochondrial Ab raises ? Of autoimmune etiology.  Also could be due to NASH.     *  Abd swelling.  Suspect ascites.  Ultrasound with paracentesis and fluid studies ordered with preop albumin.    *  LE edema, mild CHF on CXR.  Agree with 2D echo to assess for LV dysfx.      PLAN:     *  Dr Marina Goodell will see pt.  Will need repeat EGD.  Given current SOB, probably better to wait another 24 hours before starting prep.  Hopefully by then pt will have had paracentesis, which may help his dyspnea and he will have been transfused.   2D echo also ordered.    [name]  12/25/2015, 3:13 PM Pager: [phone]  GI ATTENDING  History, laboratories, x-rays, prior endoscopy reports reviewed. Agree with comprehensive consultation note as outlined above. Patient with hepatic cirrhosis, complicated by portal hypertension and prior variceal bleed possibly status post band ligation. Presents with symptomatic anemia, likely iron  deficient, and probable symptomatic ascites/anasarca. Prior endoscopy report and CT scan as noted above. Agree with plans for transfusion, diuresis, diagnostic/therapeutic paracentesis, and upper endoscopy when stable. If portal hypertensive gastropathy, Will need beta blocker and iron if no contraindications. Would also recommend MRI of the liver prior to discharge to follow-up on previous hepatic abnormalities on CT. AFP pending. Will follow.  Wilhemina Bonito.  Eda Keys., M.D. [facility] Division of Gastroenterology

## 2015-12-25 NOTE — ED Notes (Signed)
Patient transported to X-ray 

## 2015-12-25 NOTE — ED Provider Notes (Signed)
[facility]-EMERGENCY DEPT Provider Note   CSN: [id number] Arrival date & time: 12/25/15  [id number]     History   Chief Complaint Chief Complaint  Patient presents with  . Shortness of Breath    HPI Justin Scott is a 65 y.o. male.  HPI 65 year old male who presents with shortness of breath and abdominal distention he has a history of diabetes and cirrhosis of unclear etiology. Was admitted into the hospital 2014 for workup of GI bleed which was due to esophageal varices. He was discharged and was seen by gastroenterology one time, but was subsequently lost to follow-up and since then has not had management of his cirrhosis. Has noted several days of increasing abdominal distention, groin swelling, and bilateral lower extremity with increased shortness of breath. No significant abdominal pain, fevers, chills, melena or hematochezia, nausea, vomiting, or chest discomfort. Has noted some mild cough with yellow phlegm.   Past Medical History:  Diagnosis Date  . Diabetes mellitus without complication [facility](HCC)     Patient Active Problem List   Diagnosis Date Noted  . Fluid overload 12/25/2015  . Cirrhosis of liver without mention of alcohol 03/10/2013  . Acute upper GI bleed 03/10/2013  . Diabetes mellitus, type II (HCC) 03/10/2013  . Esophageal varices with bleeding (HCC) 03/10/2013  . Acute blood loss anemia 03/10/2013  . Transaminitis 03/10/2013  . Thrombocytopenia (HCC) 03/10/2013  . Lactic acidosis 03/10/2013    Past Surgical History:  Procedure Laterality Date  . ESOPHAGOGASTRODUODENOSCOPY N/A 03/10/2013   Procedure: ESOPHAGOGASTRODUODENOSCOPY (EGD);  Surgeon: Rachael Feeaniel P Jacobs, MD;  Location: [facility] ENDOSCOPY;  Service: Endoscopy;  Laterality: N/A;       Home Medications    Prior to Admission medications   Medication Sig Start Date End Date Taking? Authorizing Provider  glimepiride (AMARYL) 4 MG tablet Take 4 mg by mouth daily with breakfast.   Yes Historical Provider, MD    pantoprazole (PROTONIX) 40 MG tablet TAKE 1 TABLET BY MOUTH DAILY. 04/18/15  Yes Rachael Feeaniel P Jacobs, MD  pioglitazone (ACTOS) 30 MG tablet Take 30 mg by mouth daily. 10/24/15  Yes Historical Provider, MD    Family History Family History  Problem Relation Age of Onset  . Diabetes Mother   . Heart disease Father   . Diabetes Father     Social History Social History  Substance Use Topics  . Smoking status: Former Games developermoker  . Smokeless tobacco: Never Used  . Alcohol use No     Allergies   Review of patient's allergies indicates no known allergies.   Review of Systems Review of Systems 10/14 systems reviewed and are negative other than those stated in the HPI   Physical Exam Updated Vital Signs BP (!) 151/102   Pulse 96   Temp 98.2 F (36.8 C) (Oral)   Resp 25   Ht 5\' 7"  (1.702 m)   Wt 215 lb (97.5 kg)   SpO2 100%   BMI 33.67 kg/m   Physical Exam Physical Exam  Nursing note and vitals reviewed. Constitutional:  non-toxic, and in no acute distress Head: Normocephalic and atraumatic.  Mouth/Throat: Oropharynx is clear and moist.  Neck: Normal range of motion. Neck supple.  Cardiovascular: Normal rate and regular rhythm.  +2 bilateral lower extremity edema Pulmonary/Chest: Effort normal. Audible wheezing present. No conversational dyspnea. Abdominal: Soft. Significant abdominal distension There is no tenderness. There is no rebound and no guarding. Brown stool on rectal exam. Musculoskeletal: Normal range of motion.  Neurological: Alert, no facial droop, fluent speech,  moves all extremities symmetrically Skin: Skin is warm and dry.  Psychiatric: Cooperative   ED Treatments / Results  Labs (all labs ordered are listed, but only abnormal results are displayed) Labs Reviewed  CBC WITH DIFFERENTIAL/PLATELET - Abnormal; Notable for the following:       Result Value   RBC 3.29 (*)    Hemoglobin 6.3 (*)    HCT 23.2 (*)    MCV 70.5 (*)    MCH 19.1 (*)    MCHC 27.2 (*)     RDW 19.1 (*)    Platelets 120 (*)    All other components within normal limits  COMPREHENSIVE METABOLIC PANEL - Abnormal; Notable for the following:    Calcium 8.7 (*)    Albumin 2.8 (*)    ALT 15 (*)    All other components within normal limits  PROTIME-INR - Abnormal; Notable for the following:    Prothrombin Time 19.7 (*)    All other components within normal limits  LIPASE, BLOOD  BRAIN NATRIURETIC PEPTIDE  URINALYSIS, ROUTINE W REFLEX MICROSCOPIC (NOT AT [facility])  POC OCCULT BLOOD, ED  TYPE AND SCREEN  PREPARE RBC (CROSSMATCH)    EKG  EKG Interpretation  Date/Time:  Tuesday December 25 2015 65:78:46 EDT Ventricular Rate:  106 PR Interval:  128 QRS Duration: 88 QT Interval:  378 QTC Calculation: 502 R Axis:   -19 Text Interpretation:  Sinus tachycardia with Premature atrial complexes Left ventricular hypertrophy Nonspecific ST and T wave abnormality Abnormal ECG No significant changes  Confirmed by Saad Buhl MD, Annabelle Harman ([id number]) on 12/25/2015 8:49:16 AM       Radiology Dg Chest 2 View  Result Date: 12/25/2015 CLINICAL DATA:  Cryptogenic cirrhosis, ascites, now with increased abdominal swelling, peripheral edema, wheezing, and shortness of breath for the past 3 days. EXAM: CHEST  2 VIEW COMPARISON:  Portable chest x-ray of March 09, 2013 FINDINGS: The lungs are mildly hypoinflated. The interstitial markings are increased. The cardiac silhouette is enlarged. The central pulmonary vascularity is prominent. There are small pleural effusions blunting the costophrenic angles bilaterally. There is calcification in the wall of the aortic arch. There is mild multilevel degenerative disc disease of the thoracic spine. IMPRESSION: Mild CHF.  Small bilateral pleural effusions. Aortic atherosclerosis Electronically Signed   By: David  [place] M.D.   On: 12/25/2015 10:10    Procedures Procedures (including critical care time) CRITICAL CARE Performed by: Lavera Guise   Total critical care  time: 35 minutes  Critical care time was exclusive of separately billable procedures and treating other patients.  Critical care was necessary to treat or prevent imminent or life-threatening deterioration.  Critical care was time spent personally by me on the following activities: development of treatment plan with patient and/or surrogate as well as nursing, discussions with consultants, evaluation of patient's response to treatment, examination of patient, obtaining history from patient or surrogate, ordering and performing treatments and interventions, ordering and review of laboratory studies, ordering and review of radiographic studies, pulse oximetry and re-evaluation of patient's condition.  Medications Ordered in ED Medications  furosemide (LASIX) injection 40 mg (40 mg Intravenous Given 12/25/15 1101)     Initial Impression / Assessment and Plan / ED Course  I have reviewed the triage vital signs and the nursing notes.  Pertinent labs & imaging results that were available during my care of the patient were reviewed by me and considered in my medical decision making (see chart for details).  Clinical Course  Presents with symptoms of overload which I suspect is from ongoing liver disease which she has not had follow-up with GI and over the past 3 years. He does have some audible wheezing on exam, but oxygenating normally and speaking in full sentences. Clinically looks fluid overloaded with significant abdominal distention and bilateral pitting edema. His abdomen is benign. Chest x-ray also shows some pleural effusions with some pulmonary vascular congestion, which may also be c/w new CHF. Blood work notable for hypoalbuminemia and normalizing transaminitis with normal T bili and normal INR. he has new anemia with hemoglobin of 6.2, but no active GI bleeding on exam. We'll diuresis with 40 mg of IV Lasix given that he does have pulmonary vascular congestion and fluid in the lungs and  then will transfuse with a unit of blood. I discussed with [facility] hospitalist who will admit for ongoing management.  Final Clinical Impressions(s) / ED Diagnoses   Final diagnoses:  Cryptogenic cirrhosis (HCC)  Anemia, unspecified anemia type    New Prescriptions New Prescriptions   No medications on file     Lavera Guise, MD 12/25/15 1142

## 2015-12-25 NOTE — ED Triage Notes (Signed)
Pt c/o shortness of breath x3 days. Pt has swelling in legs and ABD. Pt a&ox4. Pt is wheezing on presentation.

## 2015-12-25 NOTE — ED Notes (Signed)
Report called to 3E RN. 

## 2015-12-25 NOTE — ED Notes (Signed)
MD at bedside. Dr. Verdie MosherLiu

## 2015-12-26 ENCOUNTER — Observation Stay (HOSPITAL_COMMUNITY): Payer: BC Managed Care – PPO

## 2015-12-26 DIAGNOSIS — K7031 Alcoholic cirrhosis of liver with ascites: Secondary | ICD-10-CM

## 2015-12-26 DIAGNOSIS — R0600 Dyspnea, unspecified: Secondary | ICD-10-CM | POA: Diagnosis not present

## 2015-12-26 DIAGNOSIS — R932 Abnormal findings on diagnostic imaging of liver and biliary tract: Secondary | ICD-10-CM | POA: Diagnosis not present

## 2015-12-26 DIAGNOSIS — R188 Other ascites: Secondary | ICD-10-CM | POA: Diagnosis present

## 2015-12-26 DIAGNOSIS — D509 Iron deficiency anemia, unspecified: Secondary | ICD-10-CM | POA: Diagnosis present

## 2015-12-26 DIAGNOSIS — Z79899 Other long term (current) drug therapy: Secondary | ICD-10-CM | POA: Diagnosis not present

## 2015-12-26 DIAGNOSIS — Z6836 Body mass index (BMI) 36.0-36.9, adult: Secondary | ICD-10-CM | POA: Diagnosis not present

## 2015-12-26 DIAGNOSIS — K5900 Constipation, unspecified: Secondary | ICD-10-CM | POA: Diagnosis present

## 2015-12-26 DIAGNOSIS — R0602 Shortness of breath: Secondary | ICD-10-CM | POA: Diagnosis present

## 2015-12-26 DIAGNOSIS — K766 Portal hypertension: Secondary | ICD-10-CM | POA: Diagnosis present

## 2015-12-26 DIAGNOSIS — K7469 Other cirrhosis of liver: Secondary | ICD-10-CM | POA: Diagnosis present

## 2015-12-26 DIAGNOSIS — D696 Thrombocytopenia, unspecified: Secondary | ICD-10-CM | POA: Diagnosis present

## 2015-12-26 DIAGNOSIS — E119 Type 2 diabetes mellitus without complications: Secondary | ICD-10-CM | POA: Diagnosis present

## 2015-12-26 DIAGNOSIS — Z87891 Personal history of nicotine dependence: Secondary | ICD-10-CM | POA: Diagnosis not present

## 2015-12-26 DIAGNOSIS — D638 Anemia in other chronic diseases classified elsewhere: Secondary | ICD-10-CM | POA: Diagnosis not present

## 2015-12-26 DIAGNOSIS — K2960 Other gastritis without bleeding: Secondary | ICD-10-CM | POA: Diagnosis present

## 2015-12-26 DIAGNOSIS — I5032 Chronic diastolic (congestive) heart failure: Secondary | ICD-10-CM | POA: Diagnosis present

## 2015-12-26 DIAGNOSIS — I4581 Long QT syndrome: Secondary | ICD-10-CM | POA: Diagnosis present

## 2015-12-26 DIAGNOSIS — K253 Acute gastric ulcer without hemorrhage or perforation: Secondary | ICD-10-CM | POA: Diagnosis not present

## 2015-12-26 DIAGNOSIS — K219 Gastro-esophageal reflux disease without esophagitis: Secondary | ICD-10-CM | POA: Diagnosis present

## 2015-12-26 DIAGNOSIS — Z7984 Long term (current) use of oral hypoglycemic drugs: Secondary | ICD-10-CM | POA: Diagnosis not present

## 2015-12-26 DIAGNOSIS — N5089 Other specified disorders of the male genital organs: Secondary | ICD-10-CM | POA: Diagnosis present

## 2015-12-26 DIAGNOSIS — K7290 Hepatic failure, unspecified without coma: Secondary | ICD-10-CM | POA: Diagnosis present

## 2015-12-26 DIAGNOSIS — K7460 Unspecified cirrhosis of liver: Secondary | ICD-10-CM

## 2015-12-26 DIAGNOSIS — E669 Obesity, unspecified: Secondary | ICD-10-CM | POA: Diagnosis present

## 2015-12-26 DIAGNOSIS — Q2733 Arteriovenous malformation of digestive system vessel: Secondary | ICD-10-CM | POA: Diagnosis not present

## 2015-12-26 DIAGNOSIS — H9190 Unspecified hearing loss, unspecified ear: Secondary | ICD-10-CM | POA: Diagnosis present

## 2015-12-26 DIAGNOSIS — Z8719 Personal history of other diseases of the digestive system: Secondary | ICD-10-CM | POA: Diagnosis not present

## 2015-12-26 LAB — GLUCOSE, CAPILLARY
GLUCOSE-CAPILLARY: 121 mg/dL — AB (ref 65–99)
Glucose-Capillary: 55 mg/dL — ABNORMAL LOW (ref 65–99)
Glucose-Capillary: 84 mg/dL (ref 65–99)
Glucose-Capillary: 119 mg/dL — ABNORMAL HIGH (ref 65–99)
Glucose-Capillary: 108 mg/dL — ABNORMAL HIGH (ref 65–99)

## 2015-12-26 LAB — HEMOGLOBIN AND HEMATOCRIT, BLOOD
HCT: 28.4 % — ABNORMAL LOW (ref 39.0–52.0)
HEMOGLOBIN: 8.1 g/dL — AB (ref 13.0–17.0)

## 2015-12-26 LAB — BASIC METABOLIC PANEL
Anion gap: 11 (ref 5–15)
BUN: 12 mg/dL (ref 6–20)
CHLORIDE: 103 mmol/L (ref 101–111)
CO2: 22 mmol/L (ref 22–32)
CREATININE: 0.87 mg/dL (ref 0.61–1.24)
Calcium: 8.4 mg/dL — ABNORMAL LOW (ref 8.9–10.3)
GFR calc Af Amer: >60 mL/min (ref >60)
GFR calc non Af Amer: >60 mL/min (ref >60)
GLUCOSE: 108 mg/dL — AB (ref 65–99)
POTASSIUM: 3.8 mmol/L (ref 3.5–5.1)
SODIUM: 136 mmol/L (ref 135–145)

## 2015-12-26 LAB — CBC
HEMATOCRIT: 22.3 % — AB (ref 39.0–52.0)
Hemoglobin: 6.3 g/dL — CL (ref 13.0–17.0)
MCH: 20.3 pg — AB (ref 26.0–34.0)
MCHC: 28.3 g/dL — AB (ref 30.0–36.0)
MCV: 71.7 fL — AB (ref 78.0–100.0)
PLATELETS: 109 10*3/uL — AB (ref 150–400)
RBC: 3.11 MIL/uL — ABNORMAL LOW (ref 4.22–5.81)
RDW: 19.5 % — AB (ref 11.5–15.5)
WBC: 5 10*3/uL (ref 4.0–10.5)

## 2015-12-26 LAB — ECHOCARDIOGRAM COMPLETE
Height: 66 in
WEIGHTICAEL: 3764.8 [oz_av]

## 2015-12-26 LAB — HEMOGLOBIN A1C
Hgb A1c MFr Bld: 5.8 % — ABNORMAL HIGH (ref 4.8–5.6)
Mean Plasma Glucose: 120 mg/dL

## 2015-12-26 LAB — PREPARE RBC (CROSSMATCH)

## 2015-12-26 MED ORDER — PROPRANOLOL HCL 10 MG PO TABS
10.0000 mg | ORAL_TABLET | Freq: Three times a day (TID) | ORAL | Status: DC
  Administered 2015-12-26 – 2015-12-28 (×8): 10 mg via ORAL
  Filled 2015-12-26 (×9): qty 1

## 2015-12-26 MED ORDER — SPIRONOLACTONE 25 MG PO TABS: 25.0000 mg | ORAL_TABLET | Freq: Every day | ORAL | Status: DC

## 2015-12-26 MED ORDER — BISACODYL 5 MG PO TBEC
10.0000 mg | DELAYED_RELEASE_TABLET | Freq: Once | ORAL | Status: AC
Start: 2015-12-26 — End: 2015-12-26
  Administered 2015-12-26: 10 mg via ORAL
  Filled 2015-12-26: qty 2

## 2015-12-26 MED ORDER — LORAZEPAM 2 MG/ML IJ SOLN: 1.0000 mg | Freq: Once | INTRAMUSCULAR | Status: DC

## 2015-12-26 MED ORDER — FUROSEMIDE 10 MG/ML IJ SOLN
40.0000 mg | Freq: Two times a day (BID) | INTRAMUSCULAR | Status: DC
  Administered 2015-12-26 – 2015-12-28 (×5): 40 mg via INTRAVENOUS
  Filled 2015-12-26 (×5): qty 4

## 2015-12-26 MED ORDER — IOPAMIDOL (ISOVUE-300) INJECTION 61%
15.0000 mL | INTRAVENOUS | Status: AC
  Administered 2015-12-26 (×2): 15 mL via ORAL

## 2015-12-26 MED ORDER — IOPAMIDOL (ISOVUE-300) INJECTION 61%
INTRAVENOUS | Status: AC
  Filled 2015-12-26: qty 100

## 2015-12-26 MED ORDER — SODIUM CHLORIDE 0.9 % IV SOLN
INTRAVENOUS | Status: DC
  Administered 2015-12-26: 15:00:00 via INTRAVENOUS

## 2015-12-26 MED ORDER — DEXTROSE 5 % IV SOLN
2.0000 g | INTRAVENOUS | Status: DC
  Administered 2015-12-26: 2 g via INTRAVENOUS
  Filled 2015-12-26 (×2): qty 2

## 2015-12-26 MED ORDER — MAGNESIUM SULFATE IN D5W 1-5 GM/100ML-% IV SOLN
1.0000 g | Freq: Once | INTRAVENOUS | Status: AC
  Administered 2015-12-26: 1 g via INTRAVENOUS
  Filled 2015-12-26: qty 100

## 2015-12-26 MED ORDER — FERROUS SULFATE 325 (65 FE) MG PO TABS
325.0000 mg | ORAL_TABLET | Freq: Every day | ORAL | Status: DC
  Administered 2015-12-27: 325 mg via ORAL
  Filled 2015-12-26: qty 1

## 2015-12-26 MED ORDER — SODIUM CHLORIDE 0.9 % IV SOLN
Freq: Once | INTRAVENOUS | Status: AC
  Administered 2015-12-26: 07:00:00 via INTRAVENOUS

## 2015-12-26 MED ORDER — FERROUS SULFATE 325 (65 FE) MG PO TABS: 325.0000 mg | ORAL_TABLET | Freq: Three times a day (TID) | ORAL | Status: DC

## 2015-12-26 MED ORDER — IOPAMIDOL (ISOVUE-300) INJECTION 61%
100.0000 mL | Freq: Once | INTRAVENOUS | Status: AC | PRN
  Administered 2015-12-26: 100 mL via INTRAVENOUS

## 2015-12-26 NOTE — Progress Notes (Signed)
PROGRESS NOTE                                                                                                                                                                                                             Patient Demographics:    Justin Scott, is a 65 y.o. male, DOB - Jul 17, 1950, ONG:[id number]  Admit date - 12/25/2015   Admitting Physician Ozella Rocksavid J Merrell, MD  Outpatient Primary MD for the patient is Justin MaskELKINS,WILSON OLIVER, MD  LOS - 0  Chief Complaint  Patient presents with  . Shortness of Breath       Brief Narrative   Justin Scott is a 65 y.o. male with a Past Medical History of cirrhosis, DM who presents with symptomatic anemia, anasarca, anemia likely from either intermittent small bowel AVMs or bleeding variceals.     Subjective:    Justin Peerslifton Ingle today has, No headache, No chest pain, No abdominal pain - No Nausea, No new weakness tingling or numbness, No Cough - SOB.    Assessment  & Plan :     1. Severe symptomatic anemia. 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  He will get total of 3 units packed RBC transfused, placed on oral iron, her pressure stable will assume on beta blocker, continue PPI, H an HDI to follow.  2. 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 Liver MRI ordered, AFP pending, GI following further workup per GI. Underwent paracentesis which showed noninfected fluid consistent with portal hypertension with SAAG of 2.6.  3. Anasarca. Likely due to #2 above, UA stable, echo pending, please on Lasix and Aldactone, fluid restrict,  4. QTC of 502. 1 g of magnesium, beta blocker, monitor on telemetry.  5. DM type II SSI.  Lab Results  Component Value Date   HGBA1C 5.8 (H) 12/25/2015   CBG (last 3)   Recent Labs  12/25/15 2121 12/26/15 0220 12/26/15 0747  GLUCAP 84 55* 84        Family Communication  :  None  Code Status :  Full  Diet : Heart Healthy, 1.5 L fluid restriction  Disposition Plan  :  remain in the hospital  Consults  : GI  Procedures  :    Paracentesis with 1.1 L of fluid removed, S  a AG 2.6, no signs of infection  DVT Prophylaxis  :   SCDs    Lab Results  Component Value Date   PLT 109 (L) 12/26/2015    Inpatient Medications  Scheduled Meds: . ferrous sulfate  325 mg Oral TID WC  . furosemide  40 mg Intravenous BID  . insulin aspart  0-9 Units Subcutaneous TID WC  . LORazepam  1 mg Intravenous Once  . pantoprazole  40 mg Oral Daily  . spironolactone  25 mg Oral Daily   Continuous Infusions:  PRN Meds:.acetaminophen **OR** acetaminophen, bisacodyl, polyethylene glycol  Antibiotics  :    Anti-infectives    None         Objective:   Vitals:   12/25/15 1947 12/26/15 0041 12/26/15 0632 12/26/15 0700  BP: (!) 141/78 (!) 141/74 132/84 130/74  Pulse: 93 (!) 101 94 94  Resp: 20 20 20  (!) 21  Temp: 98.1 F (36.7 C) 99.5 F (37.5 C) 98.4 F (36.9 C) 98.2 F (36.8 C)  TempSrc: Oral Oral Oral Oral  SpO2: 99% 94% 98% 98%  Weight:   106.7 kg (235 lb 4.8 oz)   Height:        Wt Readings from Last 3 Encounters:  12/26/15 106.7 kg (235 lb 4.8 oz)  04/08/13 85.3 kg (188 lb)  03/13/13 81.6 kg (179 lb 12.8 oz)     Intake/Output Summary (Last 24 hours) at 12/26/15 0847 Last data filed at 12/26/15 0645  Gross per 24 hour  Intake             1621 ml  Output             1850 ml  Net             -229 ml     Physical Exam  Awake Alert, Oriented X 3, No new F.N deficits, Normal affect [place].AT,PERRAL Supple Neck,No JVD, No cervical lymphadenopathy appriciated.  Symmetrical Chest wall movement, Good air movement bilaterally, CTAB RRR,No Gallops,Rubs or new Murmurs, No Parasternal Heave +ve B.Sounds, Abd distended, No tenderness, No organomegaly appriciated, No rebound - guarding or rigidity. No Cyanosis, Clubbing ,  2+ edema, No new Rash or bruise       Data Review:    CBC  Recent Labs Lab 12/25/15 0931 12/25/15 1806 12/26/15 0346  WBC 4.4 4.6 5.0  HGB 6.3* 6.9* 6.3*  HCT 23.2* 24.0* 22.3*  PLT 120* 133* 109*  MCV 70.5* 71.0* 71.7*  MCH 19.1* 20.4* 20.3*  MCHC 27.2* 28.8* 28.3*  RDW 19.1* 19.7* 19.5*  LYMPHSABS 0.7  --   --   MONOABS 0.7  --   --   EOSABS 0.1  --   --   BASOSABS 0.0  --   --     Chemistries   Recent Labs Lab 12/25/15 0931 12/26/15 0346  NA 137 136  K 4.0 3.8  CL 110 103  CO2 22 22  GLUCOSE 82 108*  BUN 10 12  CREATININE 0.77 0.87  CALCIUM 8.7* 8.4*  AST 36  --   ALT 15*  --   ALKPHOS 84  --   BILITOT 1.2  --    ------------------------------------------------------------------------------------------------------------------ No results for input(s): CHOL, HDL, LDLCALC, TRIG, CHOLHDL, LDLDIRECT in the last 72 hours.  Lab Results  Component Value Date   HGBA1C 5.8 (H) 12/25/2015   ------------------------------------------------------------------------------------------------------------------ No results for input(s): TSH, T4TOTAL, T3FREE, THYROIDAB in the last 72 hours.  Invalid input(s): FREET3 ------------------------------------------------------------------------------------------------------------------ No results for input(s):  VITAMINB12, FOLATE, FERRITIN, TIBC, IRON, RETICCTPCT in the last 72 hours.  Coagulation profile  Recent Labs Lab 12/25/15 0931  INR 1.65    No results for input(s): DDIMER in the last 72 hours.  Cardiac Enzymes No results for input(s): CKMB, TROPONINI, MYOGLOBIN in the last 168 hours.  Invalid input(s): CK ------------------------------------------------------------------------------------------------------------------    Component Value Date/Time   BNP 60.9 12/25/2015 0931    Micro Results Recent Results (from the past 240 hour(s))  Gram stain     Status: None   Collection Time: 12/25/15  3:54 PM    Result Value Ref Range Status   Specimen Description FLUID PERITONEAL ABDOMEN  Final   Special Requests NONE  Final   Gram Stain   Final    WBC PRESENT, PREDOMINANTLY MONONUCLEAR NO ORGANISMS SEEN CYTOSPIN    Report Status 12/25/2015 FINAL  Final    Radiology Reports Dg Chest 2 View  Result Date: 12/25/2015 CLINICAL DATA:  Cryptogenic cirrhosis, ascites, now with increased abdominal swelling, peripheral edema, wheezing, and shortness of breath for the past 3 days. EXAM: CHEST  2 VIEW COMPARISON:  Portable chest x-ray of March 09, 2013 FINDINGS: The lungs are mildly hypoinflated. The interstitial markings are increased. The cardiac silhouette is enlarged. The central pulmonary vascularity is prominent. There are small pleural effusions blunting the costophrenic angles bilaterally. There is calcification in the wall of the aortic arch. There is mild multilevel degenerative disc disease of the thoracic spine. IMPRESSION: Mild CHF.  Small bilateral pleural effusions. Aortic atherosclerosis Electronically Signed   By: David  [place] M.D.   On: 12/25/2015 10:10   US Paracentesis  Result Date: 12/26/2015 INDICATION: History of cirrhosis, now with symptomatic ascites. Please perform ultrasound-guided paracentesis for therapeutic and diagnostic purposes. EXAM: ULTRASOUND-GUIDED PARACENTESIS COMPARISON:  None. MEDICATIONS: 10 cc 1% lidocaine COMPLICATIONS: None immediate. TECHNIQUE: Informed written consent was obtained from the patient after a discussion of the risks, benefits and alternatives to treatment. A timeout was performed prior to the initiation of the procedure. Initial ultrasound scanning demonstrates a large amount of ascites within the right lower abdominal quadrant. The right lower abdomen was prepped and draped in the usual sterile fashion. 1% lidocaine with epinephrine was used for local anesthesia. Under direct ultrasound guidance, a 19 gauge, 7-cm, Yueh catheter was introduced. An  ultrasound image was saved for documentation purposed. The paracentesis was performed. The catheter was removed and a dressing was applied. The patient tolerated the procedure well without immediate post procedural complication. FINDINGS: A total of approximately 1.1 liters of creamy colored fluid was removed. Samples were sent to the laboratory as requested by the clinical team. IMPRESSION: Successful ultrasound-guided paracentesis yielding 1.1 liters of peritoneal fluid. Read by: Robet Leu [facility] Electronically Signed   By: Simonne Come M.D.   On: 12/25/2015 16:11    Time Spent in minutes  30   Hale Chalfin K M.D on 12/26/2015 at 8:47 AM  Between 7am to 7pm - Pager - [phone]  After 7pm go to [email or web address] - password [facility]  [facility] -  Office  [phone]

## 2015-12-26 NOTE — Progress Notes (Signed)
Daily Rounding Note  12/26/2015, 8:25 AM  LOS: 0 days   SUBJECTIVE:   Chief complaint:  dyspnea   Feels about the same.  Still feels weak, tired.  Appetite is good, no nausea.  Abdomen felt less distended after tap last night but now, after breakfast, it is tight again.  Legs, scrotum still swollen.  Feels constipated and last BM on 9/4, normally 1 to 2 BM per day.    OBJECTIVE:         Vital signs in last 24 hours:    Temp:  [97.9 F (36.6 C)-99.5 F (37.5 C)] 98.2 F (36.8 C) (09/06 0700) Pulse Rate:  [90-106] 94 (09/06 0700) Resp:  [17-29] 21 (09/06 0700) BP: (126-169)/(62-102) 130/74 (09/06 0700) SpO2:  [94 %-100 %] 98 % (09/06 0700) Weight:  [97.5 kg (215 lb)-107.7 kg (237 lb 6.4 oz)] 106.7 kg (235 lb 4.8 oz) (09/06 0632) Last BM Date: 12/24/15 Filed Weights   12/25/15 0843 12/25/15 1304 12/26/15 4098  Weight: 97.5 kg (215 lb) 107.7 kg (237 lb 6.4 oz) 106.7 kg (235 lb 4.8 oz)   General: still looks unwell, a bit diaphoretic.  Mostly comfortable but he is pretty stoic.    Heart: RRR Chest: diminished BS in bases.  Increased WOB at rest, with speech.   Abdomen: just as tight and distended as yesterday, pre-tap.  NT.  BS present. GU: scrotal edema  Extremities: 3 to 4 plus LE edema Neuro/Psych:  Pleasant, alert, oriented x 3.  No tremor or asterixis.  No obvious deficits other than HOH.    Intake/Output from previous day: 09/05 0701 - 09/06 0700 In: 1621 [P.O.:716; Blood:705; IV Piggyback:200] Out: 1850 [Urine:1850]  Intake/Output this shift: No intake/output data recorded.  Lab Results:  Recent Labs  12/25/15 0931 12/25/15 1806 12/26/15 0346  WBC 4.4 4.6 5.0  HGB 6.3* 6.9* 6.3*  HCT 23.2* 24.0* 22.3*  PLT 120* 133* 109*   BMET  Recent Labs  12/25/15 0931 12/26/15 0346  NA 137 136  K 4.0 3.8  CL 110 103  CO2 22 22  GLUCOSE 82 108*  BUN 10 12  CREATININE 0.77 0.87  CALCIUM 8.7* 8.4*    LFT  Recent Labs  12/25/15 0931  PROT 7.1  ALBUMIN 2.8*  AST 36  ALT 15*  ALKPHOS 84  BILITOT 1.2   PT/INR  Recent Labs  12/25/15 0931  LABPROT 19.7*  INR 1.65   Hepatitis Panel No results for input(s): HEPBSAG, HCVAB, HEPAIGM, HEPBIGM in the last 72 hours.  Studies/Results: Dg Chest 2 View  Result Date: 12/25/2015 CLINICAL DATA:  Cryptogenic cirrhosis, ascites, now with increased abdominal swelling, peripheral edema, wheezing, and shortness of breath for the past 3 days. EXAM: CHEST  2 VIEW COMPARISON:  Portable chest x-ray of March 09, 2013 FINDINGS: The lungs are mildly hypoinflated. The interstitial markings are increased. The cardiac silhouette is enlarged. The central pulmonary vascularity is prominent. There are small pleural effusions blunting the costophrenic angles bilaterally. There is calcification in the wall of the aortic arch. There is mild multilevel degenerative disc disease of the thoracic spine. IMPRESSION: Mild CHF.  Small bilateral pleural effusions. Aortic atherosclerosis Electronically Signed   By: David  [place] M.D.   On: 12/25/2015 10:10   US Paracentesis  Result Date: 12/26/2015 INDICATION: History of cirrhosis, now with symptomatic ascites. Please perform ultrasound-guided paracentesis for therapeutic and diagnostic purposes. EXAM: ULTRASOUND-GUIDED PARACENTESIS COMPARISON:  None. MEDICATIONS: 10 cc 1%  lidocaine COMPLICATIONS: None immediate. TECHNIQUE: Informed written consent was obtained from the patient after a discussion of the risks, benefits and alternatives to treatment. A timeout was performed prior to the initiation of the procedure. Initial ultrasound scanning demonstrates a large amount of ascites within the right lower abdominal quadrant. The right lower abdomen was prepped and draped in the usual sterile fashion. 1% lidocaine with epinephrine was used for local anesthesia. Under direct ultrasound guidance, a 19 gauge, 7-cm, Yueh catheter was  introduced. An ultrasound image was saved for documentation purposed. The paracentesis was performed. The catheter was removed and a dressing was applied. The patient tolerated the procedure well without immediate post procedural complication. FINDINGS: A total of approximately 1.1 liters of creamy colored fluid was removed. Samples were sent to the laboratory as requested by the clinical team. IMPRESSION: Successful ultrasound-guided paracentesis yielding 1.1 liters of peritoneal fluid. Read by: Robet Leu [facility] Electronically Signed   By: Simonne Come M.D.   On: 12/25/2015 16:11   Scheduled Meds: . [START ON 12/27/2015] ferrous sulfate  325 mg Oral Q breakfast  . furosemide  40 mg Intravenous BID  . insulin aspart  0-9 Units Subcutaneous TID WC  . LORazepam  1 mg Intravenous Once  . magnesium sulfate 1 - 4 g bolus IVPB  1 g Intravenous Once  . pantoprazole  40 mg Oral Daily  . propranolol  10 mg Oral TID  . spironolactone  25 mg Oral Daily   Continuous Infusions:  PRN Meds:.acetaminophen **OR** acetaminophen, bisacodyl, polyethylene glycol  ASSESMENT:   *  Symptomatic anemia.  Microcytic.  FOBT negative on 1 of 1 samples.  s/p PRBC x 1 with no "bump" in Hgb.   S/p banding of non-bleeding esophageal varices 02/2013 after presenting with ABL anemia and GI bleed.  TID po iron added per hospitalist.   *  Cirrhosis of the liver.  Non-alcoholic.  Labs with mild elevation of smooth muscle and mitochondrial Ab raises ? Of autoimmune etiology.  Also could be due to NASH.  MR liver ordered by Dr Thedore Mins.    *  Ascites, abd swelling.  S/p 9/5 1.1 liter paracentesis, "milky fluid".  Ascitic WBCs 971: SBP.   SAAG c/w portal htn.  Confirmed with PA-C who performed tap that there was very little ascites and she was surprised to obtain the liter of fluid.  His girth is not due to ascites.  Note that IV Lasix and low dose po Aldactone ordered per Dr Thedore Mins.  Also on Na, 1.5 liter fluid restricted diet.   *   Thrombocytopenia.  Chronic.   *  LE edema, mild CHF on CXR.  2D echo ordered, hopefully will  Get done today.    *  Constipation.  As inpt.  Last BM was 9/4.  The TID iron will exacerbate this.    PLAN   *  MD has ordered 2 additional PRBCs, first transfusing now.   *  EGD scheduled for noon 9/7 but pursue only if resp status is stable. This is not emergent.     *  Switched the po iron to 1 x daily,  At TID dosing will get more constipated and GI may need to pursue colonoscopy; iron normally held prior to colonoscopy.   *  For SBP, added Rocephin 2 gm daily.  Cefotaxime is in shortage so ordered Rocephin instead.    *  Discussed imaging with Dr Marina Goodell.  Given that pt's distention is coming from something other  than ascites, Dr Marina GoodellPerry prefers to image the entire abdomen/pelvis with a contrasted CT scan.  MR can be compromised by motion artifact, which is not so much an issue with CT.  Thus MR liver cancelled, CT abd/pelvis ordered.  Though renal function is normal, given he will be exposed to contrast, is now on fluid restrictions, I delayed start of Aldactone until tomorrow AM.  Left BID IV lasix in place.     Jennye MoccasinSarah Gribbin  12/26/2015, 8:25 AM Pager: [phone]  GI ATTENDING  Interval history data reviewed. Patient personally seen and examined. Agree with interval progress note. The patient is stable. He does not have SBP. CT reviewed. Still significant ascites. Cirrhosis with portal hypertension but no liver mass. No need for MRI. Recommend 1. Adjusting his diuretics to Lasix 40 mg once daily 2. Aldactone 100 mg once daily. 3. He is for EGD tomorrow. 4. He does not need fluid restriction, but does need to gram sodium diet. 5. He could undergo repeat paracentesis with significant ascites on CT, but hold off for now. Please check daily weight of this patient. 6. Continue to monitor electrolytes and blood counts daily  Keishawn Darsey N. Eda KeysPerry, Jr., M.D. [facility] Division of  Gastroenterology

## 2015-12-27 ENCOUNTER — Inpatient Hospital Stay (HOSPITAL_COMMUNITY): Payer: BC Managed Care – PPO | Admitting: Certified Registered Nurse Anesthetist

## 2015-12-27 ENCOUNTER — Encounter (HOSPITAL_COMMUNITY): Payer: Self-pay | Admitting: *Deleted

## 2015-12-27 ENCOUNTER — Encounter (HOSPITAL_COMMUNITY)
Admission: EM | Disposition: A | Discharge status: Home / Self Care | Payer: Self-pay | Source: Home / Self Care | Attending: Internal Medicine

## 2015-12-27 DIAGNOSIS — K253 Acute gastric ulcer without hemorrhage or perforation: Secondary | ICD-10-CM

## 2015-12-27 DIAGNOSIS — K259 Gastric ulcer, unspecified as acute or chronic, without hemorrhage or perforation: Secondary | ICD-10-CM

## 2015-12-27 HISTORY — PX: ESOPHAGOGASTRODUODENOSCOPY: SHX5428

## 2015-12-27 LAB — COMPREHENSIVE METABOLIC PANEL
ALBUMIN: 2.7 g/dL — AB (ref 3.5–5.0)
ALT: 13 U/L — AB (ref 17–63)
AST: 20 U/L (ref 15–41)
Alkaline Phosphatase: 75 U/L (ref 38–126)
Anion gap: 5 (ref 5–15)
BUN: 12 mg/dL (ref 6–20)
CHLORIDE: 105 mmol/L (ref 101–111)
CO2: 26 mmol/L (ref 22–32)
CREATININE: 0.74 mg/dL (ref 0.61–1.24)
Calcium: 8.6 mg/dL — ABNORMAL LOW (ref 8.9–10.3)
GFR calc Af Amer: >60 mL/min (ref >60)
GFR calc non Af Amer: >60 mL/min (ref >60)
Glucose, Bld: 87 mg/dL (ref 65–99)
Potassium: 3.9 mmol/L (ref 3.5–5.1)
Sodium: 136 mmol/L (ref 135–145)
Total Bilirubin: 1.5 mg/dL — ABNORMAL HIGH (ref 0.3–1.2)
Total Protein: 6.5 g/dL (ref 6.5–8.1)

## 2015-12-27 LAB — GLUCOSE, CAPILLARY
GLUCOSE-CAPILLARY: 89 mg/dL (ref 65–99)
GLUCOSE-CAPILLARY: 79 mg/dL (ref 65–99)
GLUCOSE-CAPILLARY: 171 mg/dL — AB (ref 65–99)
Glucose-Capillary: 81 mg/dL (ref 65–99)
Glucose-Capillary: 147 mg/dL — ABNORMAL HIGH (ref 65–99)

## 2015-12-27 LAB — TYPE AND SCREEN
ABO/RH(D): O POS
ANTIBODY SCREEN: NEGATIVE
UNIT DIVISION: 0
Unit division: 0
Unit division: 0

## 2015-12-27 LAB — CBC
HCT: 27.9 % — ABNORMAL LOW (ref 39.0–52.0)
Hemoglobin: 8.2 g/dL — ABNORMAL LOW (ref 13.0–17.0)
MCH: 21.7 pg — ABNORMAL LOW (ref 26.0–34.0)
MCHC: 29.4 g/dL — ABNORMAL LOW (ref 30.0–36.0)
MCV: 73.8 fL — AB (ref 78.0–100.0)
PLATELETS: 109 10*3/uL — AB (ref 150–400)
RBC: 3.78 MIL/uL — AB (ref 4.22–5.81)
RDW: 20.3 % — ABNORMAL HIGH (ref 11.5–15.5)
WBC: 5.8 10*3/uL (ref 4.0–10.5)

## 2015-12-27 LAB — VITAMIN B12: Vitamin B-12: 475 pg/mL (ref 180–914)

## 2015-12-27 SURGERY — EGD (ESOPHAGOGASTRODUODENOSCOPY)
Anesthesia: Monitor Anesthesia Care

## 2015-12-27 MED ORDER — POTASSIUM CHLORIDE CRYS ER 20 MEQ PO TBCR
40.0000 meq | EXTENDED_RELEASE_TABLET | Freq: Once | ORAL | Status: DC
  Filled 2015-12-27: qty 2

## 2015-12-27 MED ORDER — PROPOFOL 500 MG/50ML IV EMUL
INTRAVENOUS | Status: DC | PRN
  Administered 2015-12-27: 150 ug/kg/min via INTRAVENOUS

## 2015-12-27 MED ORDER — LACTATED RINGERS IV SOLN
INTRAVENOUS | Status: DC | PRN
  Administered 2015-12-27: 12:00:00 via INTRAVENOUS

## 2015-12-27 MED ORDER — INSULIN ASPART 100 UNIT/ML ~~LOC~~ SOLN
0.0000 [IU] | Freq: Three times a day (TID) | SUBCUTANEOUS | Status: DC
Start: 2015-12-28 — End: 2015-12-28
  Administered 2015-12-28 (×3): 2 [IU] via SUBCUTANEOUS

## 2015-12-27 MED ORDER — PROPOFOL 10 MG/ML IV BOLUS: INTRAVENOUS | Status: DC | PRN

## 2015-12-27 MED ORDER — SPIRONOLACTONE 25 MG PO TABS
100.0000 mg | ORAL_TABLET | Freq: Every day | ORAL | Status: DC
  Administered 2015-12-27 – 2015-12-28 (×2): 100 mg via ORAL
  Filled 2015-12-27 (×3): qty 4

## 2015-12-27 MED ORDER — POLYETHYLENE GLYCOL 3350 17 G PO PACK: 17.0000 g | PACK | Freq: Every day | ORAL | Status: DC

## 2015-12-27 MED ORDER — PROPOFOL 10 MG/ML IV BOLUS
INTRAVENOUS | Status: DC | PRN
  Administered 2015-12-27 (×3): 30 mg via INTRAVENOUS

## 2015-12-27 MED ORDER — POLYETHYLENE GLYCOL 3350 17 G PO PACK
17.0000 g | PACK | Freq: Every day | ORAL | Status: DC
  Administered 2015-12-27 – 2015-12-28 (×2): 17 g via ORAL
  Filled 2015-12-27 (×2): qty 1

## 2015-12-27 MED ORDER — BUTAMBEN-TETRACAINE-BENZOCAINE 2-2-14 % EX AERO
INHALATION_SPRAY | CUTANEOUS | Status: DC | PRN
  Administered 2015-12-27: 2 via TOPICAL

## 2015-12-27 MED ORDER — METOLAZONE 2.5 MG PO TABS
2.5000 mg | ORAL_TABLET | Freq: Once | ORAL | Status: AC
  Administered 2015-12-27: 2.5 mg via ORAL
  Filled 2015-12-27: qty 1

## 2015-12-27 MED ORDER — LACTATED RINGERS IV SOLN: INTRAVENOUS | Status: DC

## 2015-12-27 MED ORDER — FERROUS SULFATE 325 (65 FE) MG PO TABS
325.0000 mg | ORAL_TABLET | Freq: Two times a day (BID) | ORAL | Status: DC
  Administered 2015-12-28 (×2): 325 mg via ORAL
  Filled 2015-12-27 (×2): qty 1

## 2015-12-27 MED ORDER — FERROUS SULFATE 325 (65 FE) MG PO TABS
325.0000 mg | ORAL_TABLET | Freq: Two times a day (BID) | ORAL | Status: DC
  Administered 2015-12-27: 325 mg via ORAL
  Filled 2015-12-27: qty 1

## 2015-12-27 MED ORDER — ONDANSETRON HCL 4 MG/2ML IJ SOLN
INTRAMUSCULAR | Status: DC | PRN
  Administered 2015-12-27: 4 mg via INTRAVENOUS

## 2015-12-27 NOTE — Op Note (Signed)
[facility] Patient Name: Joby Richart Procedure Date : 12/27/2015 MRN: [id number] Attending MD: Wilhemina Bonito. Marina Goodell , MD Date of Birth: 09/11/1950 CSN: [id number] Age: 65 Admit Type: Inpatient Procedure:                Upper GI endoscopy, with biopsy Indications:              Iron deficiency anemia, Heme positive stool Providers:                Wilhemina Bonito. Marina Goodell, MD, Sarah Monday RN, RN, Lorenda Ishihara,                            Technician, Milinda Cave, CRNA Referring MD:              Medicines:                Monitored Anesthesia Care Complications:            No immediate complications. Estimated Blood Loss:     Estimated blood loss: none. Procedure:                Pre-Anesthesia Assessment:                           - Prior to the procedure, a History and Physical                            was performed, and patient medications and                            allergies were reviewed. The patient's tolerance of                            previous anesthesia was also reviewed. The risks                            and benefits of the procedure and the sedation                            options and risks were discussed with the patient.                            All questions were answered, and informed consent                            was obtained. Prior Anticoagulants: The patient has                            taken no previous anticoagulant or antiplatelet                            agents. ASA Grade Assessment: III - A patient with                            severe systemic disease. After reviewing the risks  and benefits, the patient was deemed in                            satisfactory condition to undergo the procedure.                           After obtaining informed consent, the endoscope was                            passed under direct vision. Throughout the                            procedure, the patient's blood pressure, pulse, and                             oxygen saturations were monitored continuously. The                            EG-[id number] ([id number]) scope was introduced through the                            mouth, and advanced to the second part of duodenum.                            The upper GI endoscopy was accomplished without                            difficulty. The patient tolerated the procedure                            well. Scope In: Scope Out: Findings:      One mild benign-appearing, intrinsic stenosis was found. This measured       1.6 cm (inner diameter).      Varices were found in the lower third of the esophagus. These were       remnants from prior band ligation and trivial without stigmata.      Scattered moderate inflammation characterized by erosions was found in       the prepyloric region of the stomach. Biopsies were taken with a cold       forceps for Helicobacter pylori testing using CLOtest.The stomach was       otherwise normal. No proximal gastric varices      The exam was otherwise without abnormality.      The examined duodenum was normal. Impression:               1. 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                           2. CLINICAL IMPRESSION.                           - End-stage liver disease with portal hypertension                            and ascites                           - MELD score 12 Moderate Sedation:      none Recommendation:           1. Aldactone 100 mg daily                           2. Lasix 40 mg daily                           3. 2 g sodium diet                           4. Measured daily weight at home and record                            5. Pantoprazole 40 mg daily                           6. Iron sulfate 325 mg twice daily                           7. Office follow-up with PCP in 1-2 weeks                           8. Office follow-up with GI (Dr. Christella HartiganJacobs or his                            agent) in 1-2 weeks                           Patient provided with this report. Recommendations                            reviewed. Discussed earlier today with                            hospitalists. Will sign off but are available for                            relevant questions or issues.  Thank you. Procedure Code(s):        --- Professional ---                           [phone], Esophagogastroduodenoscopy, flexible,                            transoral; with biopsy, single or multiple Diagnosis Code(s):        --- Professional ---                           K22.2, Esophageal obstruction                           I85.00, Esophageal varices without bleeding                           K29.70, Gastritis, unspecified, without bleeding                           D50.9, Iron deficiency anemia, unspecified                           R19.5, Other fecal abnormalities CPT copyright 2016 [employer]. All rights reserved. The codes documented in this report are preliminary and upon coder review may  be revised to meet current compliance requirements. Wilhemina Bonito. Marina Goodell, MD 12/27/2015 12:46:19 PM This report has been signed electronically. Number of Addenda: 0

## 2015-12-27 NOTE — Anesthesia Procedure Notes (Signed)
Procedure Name: MAC Date/Time: 12/27/2015 12:08 PM Performed by: Geraldo DockerSOLHEIM, Sheron Tallman SALOMON Pre-anesthesia Checklist: Patient identified, Patient being monitored, Timeout performed, Emergency Drugs available and Suction available Patient Re-evaluated:Patient Re-evaluated prior to inductionOxygen Delivery Method: Nasal cannula Preoxygenation: Pre-oxygenation with 100% oxygen Number of attempts: 1 Placement Confirmation: positive ETCO2 Dental Injury: Teeth and Oropharynx as per pre-operative assessment

## 2015-12-27 NOTE — Anesthesia Preprocedure Evaluation (Addendum)
Anesthesia Evaluation  Patient identified by MRN, date of birth, ID band Patient awake    Reviewed: NPO status , Patient's Chart, lab work & pertinent test results  History of Anesthesia Complications Negative for: history of anesthetic complications  Airway Mallampati: I  TM Distance: >3 FB Neck ROM: Full    Dental  (+) Teeth Intact, Dental Advisory Given   Pulmonary shortness of breath, with exertion, at rest and lying, former smoker,     + decreased breath sounds+ wheezing      Cardiovascular Exercise Tolerance: Poor hypertension, + DOE   Rhythm:Regular Rate:Normal  portal hypertension   Neuro/Psych negative neurological ROS  negative psych ROS   GI/Hepatic PUD, (+) Cirrhosis   ascites    ,   Endo/Other  diabetes, Type 2Morbid obesity  Renal/GU negative Renal ROS     Musculoskeletal   Abdominal (+) + obese,   Peds  Hematology  (+) anemia ,   Anesthesia Other Findings status post total of 3 units packed RBC on 12/26/2015  Reproductive/Obstetrics                          BP Readings from Last 3 Encounters:  12/27/15 140/77  04/08/13 138/70  03/13/13 (!) 150/69   Lab Results  Component Value Date   WBC 5.8 12/27/2015   HGB 8.2 (L) 12/27/2015   HCT 27.9 (L) 12/27/2015   MCV 73.8 (L) 12/27/2015   PLT 109 (L) 12/27/2015     Chemistry      Component Value Date/Time   NA 136 12/27/2015 0217   K 3.9 12/27/2015 0217   CL 105 12/27/2015 0217   CO2 26 12/27/2015 0217   BUN 12 12/27/2015 0217   CREATININE 0.74 12/27/2015 0217      Component Value Date/Time   CALCIUM 8.6 (L) 12/27/2015 0217   ALKPHOS 75 12/27/2015 0217   AST 20 12/27/2015 0217   ALT 13 (L) 12/27/2015 0217   BILITOT 1.5 (H) 12/27/2015 0217     Lab Results  Component Value Date   INR 1.65 12/25/2015   INR 1.5 (H) 04/08/2013   INR 1.52 (H) 03/11/2013   Lab Results  Component Value Date   HGBA1C 5.8 (H)  12/25/2015    Anesthesia Physical Anesthesia Plan  ASA: III  Anesthesia Plan: MAC   Post-op Pain Management:    Induction: Intravenous  Airway Management Planned: Natural Airway and Nasal Cannula  Additional Equipment:   Intra-op Plan:   Post-operative Plan:   Informed Consent:   Dental advisory given  Plan Discussed with: CRNA, Anesthesiologist and Surgeon  Anesthesia Plan Comments:         Anesthesia Quick Evaluation

## 2015-12-27 NOTE — Transfer of Care (Signed)
Immediate Anesthesia Transfer of Care Note  Patient: Justin Scott  Procedure(s) Performed: Procedure(s): ESOPHAGOGASTRODUODENOSCOPY (EGD) (N/A)  Patient Location: Endoscopy Unit  Anesthesia Type:MAC  Level of Consciousness: lethargic and responds to stimulation  Airway & Oxygen Therapy: Patient Spontanous Breathing and Patient connected to nasal cannula oxygen  Post-op Assessment: Report given to RN and Post -op Vital signs reviewed and stable  Post vital signs: Reviewed and stable  Last Vitals:  Vitals:   12/27/15 0859 12/27/15 1121  BP: 133/68 140/77  Pulse: 68   Resp: 20 15  Temp:  36.7 C    Last Pain:  Vitals:   12/27/15 1121  TempSrc: Oral         Complications: No apparent anesthesia complications

## 2015-12-27 NOTE — Anesthesia Preprocedure Evaluation (Deleted)
Anesthesia Evaluation  Patient identified by MRN, date of birth, ID band Patient awake    Reviewed: Allergy & Precautions, NPO status , Patient's Chart, lab work & pertinent test results  Airway Mallampati: III  TM Distance: >3 FB Neck ROM: Full    Dental no notable dental hx. (+) Teeth Intact, Dental Advisory Given   Pulmonary shortness of breath and with exertion, asthma , former smoker,    Pulmonary exam normal breath sounds clear to auscultation       Cardiovascular hypertension, Normal cardiovascular exam Rhythm:Regular Rate:Normal     Neuro/Psych negative neurological ROS  negative psych ROS   GI/Hepatic Neg liver ROS, GERD  Medicated,  Endo/Other  diabetes, Type 2  Renal/GU negative Renal ROS  negative genitourinary   Musculoskeletal  (+) Arthritis ,   Abdominal   Peds  Hematology negative hematology ROS (+)   Anesthesia Other Findings   Reproductive/Obstetrics                           Anesthesia Physical Anesthesia Plan  ASA: 3  Anesthesia Plan: General   Post-op Pain Management: Tylenol PO (pre-op)*   Induction: Intravenous  PONV Risk Score and Plan: 2 and Ondansetron, Dexamethasone and Midazolam  Airway Management Planned: LMA  Additional Equipment:   Intra-op Plan:   Post-operative Plan: Extubation in OR  Informed Consent: I have reviewed the patients History and Physical, chart, labs and discussed the procedure including the risks, benefits and alternatives for the proposed anesthesia with the patient or authorized representative who has indicated his/her understanding and acceptance.     Dental advisory given  Plan Discussed with: CRNA  Anesthesia Plan Comments:         Anesthesia Quick Evaluation  

## 2015-12-27 NOTE — Progress Notes (Signed)
PROGRESS NOTE                                                                                                                                                                                                             Patient Demographics:    Justin Scott, is a 65 y.o. male, DOB - August 08, 1950, NWG:[id number]  Admit date - 12/25/2015   Admitting Physician Ozella Rocks, MD  Outpatient Primary MD for the patient is Kaleen Mask, MD  LOS - 1  Chief Complaint  Patient presents with  . Shortness of Breath       Brief Narrative   Justin Scott is a 65 y.o. male with a Past Medical History of cirrhosis, DM who presents with symptomatic anemia, anasarca, anemia likely from either intermittent small bowel AVMs or bleeding variceals.     Subjective:    Justin Scott today has, No headache, No chest pain, No abdominal pain - No Nausea, No new weakness tingling or numbness, No Cough - SOB.    Assessment  & Plan :     1. Severe symptomatic anemia. 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 He is status post total of 3 units packed RBC on 12/26/2015, posttransfusion H&H stable, placed on oral iron, placed on propanolol, continue PPI, GI following due for EGD on 12/27/2015.  2. 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 No MRI needed per GI, AFP per GI if needed in 2014 was normal, GI following. Underwent paracentesis which showed noninfected fluid consistent with portal hypertension with SAAG of 2.6. Placed on diuretics.  3. Anasarca. Likely due to #2 above, UA stable, echo pending, please on Lasix and Aldactone, fluid restrict,  4. QTC of 502. 1 g of magnesium, beta blocker, monitor on telemetry.  5. DM type II SSI.  Lab Results  Component Value Date   HGBA1C 5.8 (H) 12/25/2015   CBG (last 3)   Recent Labs  12/26/15 1713 12/26/15 2158 12/27/15 0753  GLUCAP 119* 108* 89      Family Communication  :  None  Code Status :  Full  Diet : Heart Healthy, 1.5 L fluid restriction  Disposition Plan  :  remain in the hospital  Consults  : GI  Procedures  :    Paracentesis  with 1.1 L of fluid removed, S a AG 2.6, no signs of infection  DVT Prophylaxis  :   SCDs    Lab Results  Component Value Date   PLT 109 (L) 12/27/2015    Inpatient Medications  Scheduled Meds: . ferrous sulfate  325 mg Oral Q breakfast  . furosemide  40 mg Intravenous BID  . insulin aspart  0-9 Units Subcutaneous TID WC  . LORazepam  1 mg Intravenous Once  . pantoprazole  40 mg Oral Daily  . polyethylene glycol  17 g Oral Daily  . potassium chloride  40 mEq Oral Once  . propranolol  10 mg Oral TID  . spironolactone  100 mg Oral Daily   Continuous Infusions:  PRN Meds:.acetaminophen **OR** acetaminophen, bisacodyl, polyethylene glycol  Antibiotics  :    Anti-infectives    Start     Dose/Rate Route Frequency Ordered Stop   12/26/15 1100  cefTRIAXone (ROCEPHIN) 2 g in dextrose 5 % 50 mL IVPB  Status:  Discontinued     2 g 100 mL/hr over 30 Minutes Intravenous Every 24 hours 12/26/15 1014 12/27/15 0810         Objective:   Vitals:   12/26/15 1427 12/26/15 2302 12/27/15 0516 12/27/15 0859  BP: (!) 158/83 127/68 130/65 133/68  Pulse: 74 70 67 68  Resp: 20 20 20 20   Temp: 97.3 F (36.3 C) 98.4 F (36.9 C) 98.6 F (37 C)   TempSrc: Oral Oral Oral   SpO2: 100% 95% 96% 97%  Weight:   106.5 kg (234 lb 11.2 oz)   Height:        Wt Readings from Last 3 Encounters:  12/27/15 106.5 kg (234 lb 11.2 oz)  04/08/13 85.3 kg (188 lb)  03/13/13 81.6 kg (179 lb 12.8 oz)     Intake/Output Summary (Last 24 hours) at 12/27/15 1009 Last data filed at 12/27/15 1000  Gross per 24 hour  Intake          1446.67 ml  Output             3480 ml  Net         -2033.33 ml     Physical Exam  Awake Alert,  Oriented X 3, No new F.N deficits, Normal affect [place].AT,PERRAL Supple Neck,No JVD, No cervical lymphadenopathy appriciated.  Symmetrical Chest wall movement, Good air movement bilaterally, CTAB RRR,No Gallops,Rubs or new Murmurs, No Parasternal Heave +ve B.Sounds, Abd distended, No tenderness, No organomegaly appriciated, No rebound - guarding or rigidity. No Cyanosis, Clubbing , 2+ edema, No new Rash or bruise       Data Review:    CBC  Recent Labs Lab 12/25/15 0931 12/25/15 1806 12/26/15 0346 12/26/15 1538 12/27/15 0217  WBC 4.4 4.6 5.0  --  5.8  HGB 6.3* 6.9* 6.3* 8.1* 8.2*  HCT 23.2* 24.0* 22.3* 28.4* 27.9*  PLT 120* 133* 109*  --  109*  MCV 70.5* 71.0* 71.7*  --  73.8*  MCH 19.1* 20.4* 20.3*  --  21.7*  MCHC 27.2* 28.8* 28.3*  --  29.4*  RDW 19.1* 19.7* 19.5*  --  20.3*  LYMPHSABS 0.7  --   --   --   --   MONOABS 0.7  --   --   --   --   EOSABS 0.1  --   --   --   --   BASOSABS 0.0  --   --   --   --  Chemistries   Recent Labs Lab 12/25/15 0931 12/26/15 0346 12/27/15 0217  NA 137 136 136  K 4.0 3.8 3.9  CL 110 103 105  CO2 22 22 26   GLUCOSE 82 108* 87  BUN 10 12 12   CREATININE 0.77 0.87 0.74  CALCIUM 8.7* 8.4* 8.6*  AST 36  --  20  ALT 15*  --  13*  ALKPHOS 84  --  75  BILITOT 1.2  --  1.5*   ------------------------------------------------------------------------------------------------------------------ No results for input(s): CHOL, HDL, LDLCALC, TRIG, CHOLHDL, LDLDIRECT in the last 72 hours.  Lab Results  Component Value Date   HGBA1C 5.8 (H) 12/25/2015   ------------------------------------------------------------------------------------------------------------------ No results for input(s): TSH, T4TOTAL, T3FREE, THYROIDAB in the last 72 hours.  Invalid input(s): FREET3 ------------------------------------------------------------------------------------------------------------------  Recent Labs  12/27/15 0217  VITAMINB12 475     Coagulation profile  Recent Labs Lab 12/25/15 0931  INR 1.65    No results for input(s): DDIMER in the last 72 hours.  Cardiac Enzymes No results for input(s): CKMB, TROPONINI, MYOGLOBIN in the last 168 hours.  Invalid input(s): CK ------------------------------------------------------------------------------------------------------------------    Component Value Date/Time   BNP 60.9 12/25/2015 0931    Micro Results Recent Results (from the past 240 hour(s))  Gram stain     Status: None   Collection Time: 12/25/15  3:54 PM  Result Value Ref Range Status   Specimen Description FLUID PERITONEAL ABDOMEN  Final   Special Requests NONE  Final   Gram Stain   Final    WBC PRESENT, PREDOMINANTLY MONONUCLEAR NO ORGANISMS SEEN CYTOSPIN    Report Status 12/25/2015 FINAL  Final  Culture, body fluid-bottle     Status: None (Preliminary result)   Collection Time: 12/25/15  3:54 PM  Result Value Ref Range Status   Specimen Description FLUID PERITONEAL ABDOMEN  Final   Special Requests BOTTLES DRAWN AEROBIC AND ANAEROBIC 10CC  Final   Culture NO GROWTH < 24 HOURS  Final   Report Status PENDING  Incomplete    Radiology Reports Dg Chest 2 View  Result Date: 12/25/2015 CLINICAL DATA:  Cryptogenic cirrhosis, ascites, now with increased abdominal swelling, peripheral edema, wheezing, and shortness of breath for the past 3 days. EXAM: CHEST  2 VIEW COMPARISON:  Portable chest x-ray of March 09, 2013 FINDINGS: The lungs are mildly hypoinflated. The interstitial markings are increased. The cardiac silhouette is enlarged. The central pulmonary vascularity is prominent. There are small pleural effusions blunting the costophrenic angles bilaterally. There is calcification in the wall of the aortic arch. There is mild multilevel degenerative disc disease of the thoracic spine. IMPRESSION: Mild CHF.  Small bilateral pleural effusions. Aortic atherosclerosis Electronically Signed   By: David   [place] M.D.   On: 12/25/2015 10:10   Ct Abdomen Pelvis W Contrast  Result Date: 12/26/2015 CLINICAL DATA:  Anemia.  Cirrhosis and abdominal distention. EXAM: CT ABDOMEN AND PELVIS WITH CONTRAST TECHNIQUE: Multidetector CT imaging of the abdomen and pelvis was performed using the standard protocol following bolus administration of intravenous contrast. CONTRAST:  ISOVUE-300 IOPAMIDOL (ISOVUE-300) INJECTION 61% COMPARISON:  03/09/2013 FINDINGS: Lower chest: There are small bilateral pleural effusions identified. Hepatobiliary: There is diffuse atrophy of the liver which has a nodular contour or compatible with advanced cirrhosis. No suspicious focal liver abnormality identified. Mild gallbladder wall edema. No biliary dilatation. Pancreas: No inflammation or mass identified. Spleen: The spleen is enlarged with a length of 17 cm. No focal splenic abnormality. Adrenals/Urinary Tract: Normal appearance of the adrenal glands.  The kidneys both appear normal. Normal appearance of the urinary bladder. Stomach/Bowel: The stomach is normal. There is no pathologic dilatation of the large or small bowel loops. Vascular/Lymphatic: Calcified atherosclerotic disease involves the abdominal aorta. No aneurysm. Periaortic lymph node measures 11 mm, image number 18 of series 2. Portacaval lymph node is enlarged measuring 1.4 cm. Multiple prominent mesenteric lymph nodes are also identified. There is re- cannulization of the umbilical vein. 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 Reproductive: No mass identified. Other: Moderate to marked amount of abdominal ascites is identified. Musculoskeletal: No aggressive lytic or sclerotic bone lesions identified. IMPRESSION: 1. Advanced changes of cirrhosis. 2. Stigmata of portal venous hypertension including ascites, splenomegaly and varicosities. 3. Aortic atherosclerosis 4. Borderline enlarged upper abdominal lymph nodes may be nonspecific in the setting of cirrhosis.  Electronically Signed   By: Signa Kellaylor  Stroud M.D.   On: 12/26/2015 13:56   [place] Paracentesis  Result Date: 12/26/2015 INDICATION: History of cirrhosis, now with symptomatic ascites. Please perform ultrasound-guided paracentesis for therapeutic and diagnostic purposes. EXAM: ULTRASOUND-GUIDED PARACENTESIS COMPARISON:  None. MEDICATIONS: 10 cc 1% lidocaine COMPLICATIONS: None immediate. TECHNIQUE: Informed written consent was obtained from the patient after a discussion of the risks, benefits and alternatives to treatment. A timeout was performed prior to the initiation of the procedure. Initial ultrasound scanning demonstrates a large amount of ascites within the right lower abdominal quadrant. The right lower abdomen was prepped and draped in the usual sterile fashion. 1% lidocaine with epinephrine was used for local anesthesia. Under direct ultrasound guidance, a 19 gauge, 7-cm, Yueh catheter was introduced. An ultrasound image was saved for documentation purposed. The paracentesis was performed. The catheter was removed and a dressing was applied. The patient tolerated the procedure well without immediate post procedural complication. FINDINGS: A total of approximately 1.1 liters of creamy colored fluid was removed. Samples were sent to the laboratory as requested by the clinical team. IMPRESSION: Successful ultrasound-guided paracentesis yielding 1.1 liters of peritoneal fluid. Read by: Robet LeuPamela A Turpin [facility] Electronically Signed   By: Simonne ComeJohn  Watts M.D.   On: 12/25/2015 16:11    Time Spent in minutes  30   Okechukwu Regnier K M.D on 12/27/2015 at 10:09 AM  Between 7am to 7pm - Pager - [phone]  After 7pm go to [email or web address] - password [email or web address]  [facility] -  Office  [phone]

## 2015-12-27 NOTE — Progress Notes (Signed)
Patient back to unit from endo, telemetry back on Jennine Peddy A [employer], RN

## 2015-12-27 NOTE — Progress Notes (Signed)
Daily Rounding Note  12/27/2015, 8:23 AM  LOS: 1 day   SUBJECTIVE:   Chief complaint: weakness    Feels ok.  Not dizzy, not SOB.  Still has swelling, though belly less uncomfortable.  Eating well.  Had moderately large BM yesterday.    OBJECTIVE:         Vital signs in last 24 hours:    Temp:  [97.3 F (36.3 C)-98.8 F (37.1 C)] 98.6 F (37 C) (09/07 0516) Pulse Rate:  [67-94] 67 (09/07 0516) Resp:  [20-21] 20 (09/07 0516) BP: (127-164)/(65-92) 130/65 (09/07 0516) SpO2:  [95 %-100 %] 96 % (09/07 0516) Weight:  [106.5 kg (234 lb 11.2 oz)] 106.5 kg (234 lb 11.2 oz) (09/07 0516) Last BM Date: 12/24/15 Filed Weights   12/25/15 1304 12/26/15 0632 12/27/15 0516  Weight: 107.7 kg (237 lb 6.4 oz) 106.7 kg (235 lb 4.8 oz) 106.5 kg (234 lb 11.2 oz)   General: pleasant, HOH.  Comfortable.  Somewhat chronically ill looking   Heart: RRR Chest: clear bil.  Still with dyspnea with speech and minor activity Abdomen: large, firm, protuberant, normal BS.  NT GU: scrotal edema.   Extremities: 3+ LE edema: slightly improved Neuro/Psych:  Pleasant, alert, oriented x 3.  No limb weakness or tremor.  HOH  Intake/Output from previous day: 09/06 0701 - 09/07 0700 In: 1446.7 [P.O.:600; I.V.:86.7; Blood:760] Out: 2950 [Urine:2950]  Intake/Output this shift: No intake/output data recorded.  Lab Results:  Recent Labs  12/25/15 1806 12/26/15 0346 12/26/15 1538 12/27/15 0217  WBC 4.6 5.0  --  5.8  HGB 6.9* 6.3* 8.1* 8.2*  HCT 24.0* 22.3* 28.4* 27.9*  PLT 133* 109*  --  109*   BMET  Recent Labs  12/25/15 0931 12/26/15 0346 12/27/15 0217  NA 137 136 136  K 4.0 3.8 3.9  CL 110 103 105  CO2 22 22 26   GLUCOSE 82 108* 87  BUN 10 12 12   CREATININE 0.77 0.87 0.74  CALCIUM 8.7* 8.4* 8.6*   LFT  Recent Labs  12/25/15 0931 12/27/15 0217  PROT 7.1 6.5  ALBUMIN 2.8* 2.7*  AST 36 20  ALT 15* 13*  ALKPHOS 84 75  BILITOT  1.2 1.5*   PT/INR  Recent Labs  12/25/15 0931  LABPROT 19.7*  INR 1.65   Hepatitis Panel No results for input(s): HEPBSAG, HCVAB, HEPAIGM, HEPBIGM in the last 72 hours.  Studies/Results: Dg Chest 2 View  Result Date: 12/25/2015 CLINICAL DATA:  Cryptogenic cirrhosis, ascites, now with increased abdominal swelling, peripheral edema, wheezing, and shortness of breath for the past 3 days. EXAM: CHEST  2 VIEW COMPARISON:  Portable chest x-ray of March 09, 2013 FINDINGS: The lungs are mildly hypoinflated. The interstitial markings are increased. The cardiac silhouette is enlarged. The central pulmonary vascularity is prominent. There are small pleural effusions blunting the costophrenic angles bilaterally. There is calcification in the wall of the aortic arch. There is mild multilevel degenerative disc disease of the thoracic spine. IMPRESSION: Mild CHF.  Small bilateral pleural effusions. Aortic atherosclerosis Electronically Signed   By: David  Swaziland[place] M.D.   On: 12/25/2015 10:10   Ct Abdomen Pelvis W Contrast  Result Date: 12/26/2015 CLINICAL DATA:  Anemia.  Cirrhosis and abdominal distention. EXAM: CT ABDOMEN AND PELVIS WITH CONTRAST TECHNIQUE: Multidetector CT imaging of the abdomen and pelvis was performed using the standard protocol following bolus administration of intravenous contrast. CONTRAST:  100mL ISOVUE-300 IOPAMIDOL (ISOVUE-300) INJECTION 61% COMPARISON:  03/09/2013 FINDINGS: Lower chest: There are small bilateral pleural effusions identified. Hepatobiliary: There is diffuse atrophy of the liver which has a nodular contour or compatible with advanced cirrhosis. No suspicious focal liver abnormality identified. Mild gallbladder wall edema. No biliary dilatation. Pancreas: No inflammation or mass identified. Spleen: The spleen is enlarged with a length of 17 cm. No focal splenic abnormality. Adrenals/Urinary Tract: Normal appearance of the adrenal glands. The kidneys both appear normal.  Normal appearance of the urinary bladder. Stomach/Bowel: The stomach is normal. There is no pathologic dilatation of the large or small bowel loops. Vascular/Lymphatic: Calcified atherosclerotic disease involves the abdominal aorta. No aneurysm. Periaortic lymph node measures 11 mm, image number 18 of series 2. Portacaval lymph node is enlarged measuring 1.4 cm. Multiple prominent mesenteric lymph nodes are also identified. There is re- cannulization of the umbilical vein. 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 Reproductive: No mass identified. Other: Moderate to marked amount of abdominal ascites is identified. Musculoskeletal: No aggressive lytic or sclerotic bone lesions identified. IMPRESSION: 1. Advanced changes of cirrhosis. 2. Stigmata of portal venous hypertension including ascites, splenomegaly and varicosities. 3. Aortic atherosclerosis 4. Borderline enlarged upper abdominal lymph nodes may be nonspecific in the setting of cirrhosis. Electronically Signed   By: Signa Kell M.D.   On: 12/26/2015 13:56   US Paracentesis  Result Date: 12/26/2015 INDICATION: History of cirrhosis, now with symptomatic ascites. Please perform ultrasound-guided paracentesis for therapeutic and diagnostic purposes. EXAM: ULTRASOUND-GUIDED PARACENTESIS COMPARISON:  None. MEDICATIONS: 10 cc 1% lidocaine COMPLICATIONS: None immediate. TECHNIQUE: Informed written consent was obtained from the patient after a discussion of the risks, benefits and alternatives to treatment. A timeout was performed prior to the initiation of the procedure. Initial ultrasound scanning demonstrates a large amount of ascites within the right lower abdominal quadrant. The right lower abdomen was prepped and draped in the usual sterile fashion. 1% lidocaine with epinephrine was used for local anesthesia. Under direct ultrasound guidance, a 19 gauge, 7-cm, Yueh catheter was introduced. An ultrasound image was saved for documentation purposed. The  paracentesis was performed. The catheter was removed and a dressing was applied. The patient tolerated the procedure well without immediate post procedural complication. FINDINGS: A total of approximately 1.1 liters of creamy colored fluid was removed. Samples were sent to the laboratory as requested by the clinical team. IMPRESSION: Successful ultrasound-guided paracentesis yielding 1.1 liters of peritoneal fluid. Read by: Robet Leu [facility] Electronically Signed   By: Simonne Come M.D.   On: 12/25/2015 16:11   Scheduled Meds: . ferrous sulfate  325 mg Oral Q breakfast  . furosemide  40 mg Intravenous BID  . insulin aspart  0-9 Units Subcutaneous TID WC  . LORazepam  1 mg Intravenous Once  . pantoprazole  40 mg Oral Daily  . potassium chloride  40 mEq Oral Once  . propranolol  10 mg Oral TID  . spironolactone  100 mg Oral Daily   Continuous Infusions:  PRN Meds:.acetaminophen **OR** acetaminophen, bisacodyl, polyethylene glycol  ASSESMENT:   * Symptomatic anemia. Microcytic.  FOBT negative on 1 of 1 samples. s/p PRBC x 3.  Hgb up 2 grams.  TID po iron added per hospitalist.   *  S/p banding of non-bleeding esophageal varices 02/2013 after presenting with ABL anemia and GI bleed.  Propanolol added per Dr Thedore Mins.   * Cirrhosis of the liver. Non-alcoholic. CT with advanced cirrhosis.  CT with no suspicious liver lesions and non-specific upper abdominal adenopathy.     *  Ascites, abd swelling, anasarca. On Na, 1.5 liter fluid restricted diet.  Aldactone 100 mg added 9/6.  BID IV Lasix still in place.  EF 60 to 65%, mild MVR, no diastolic dysfx per echo.     *  Thrombocytopenia.  Chronic.   * LE edema, mild CHF on CXR. 2D echo ordered, hopefully will  Get done today.    *  Constipation, as inpt improved with dulcolax.    PLAN   *  Pt stable for EGD set for noon today.    *  Daily standing weights.   *  ? Change to po Lasix.    *  After EGD, restart Na restricted  but not fluid restricted diet.   Justin Scott  12/27/2015, 8:23 AM Pager: [phone]  GI attending  As above. For EGD today. See that report please  Wilhemina Bonito. Eda Keys., M.D. [facility] Division of Gastroenterology

## 2015-12-28 LAB — GLUCOSE, CAPILLARY
GLUCOSE-CAPILLARY: 163 mg/dL — AB (ref 65–99)
Glucose-Capillary: 132 mg/dL — ABNORMAL HIGH (ref 65–99)
Glucose-Capillary: 188 mg/dL — ABNORMAL HIGH (ref 65–99)

## 2015-12-28 LAB — COMPREHENSIVE METABOLIC PANEL
ALK PHOS: 76 U/L (ref 38–126)
ALT: 13 U/L — AB (ref 17–63)
AST: 24 U/L (ref 15–41)
Albumin: 2.5 g/dL — ABNORMAL LOW (ref 3.5–5.0)
Anion gap: 9 (ref 5–15)
BILIRUBIN TOTAL: 0.9 mg/dL (ref 0.3–1.2)
BUN: 15 mg/dL (ref 6–20)
CALCIUM: 8.5 mg/dL — AB (ref 8.9–10.3)
CHLORIDE: 99 mmol/L — AB (ref 101–111)
CO2: 26 mmol/L (ref 22–32)
CREATININE: 0.91 mg/dL (ref 0.61–1.24)
GFR calc Af Amer: >60 mL/min (ref >60)
Glucose, Bld: 136 mg/dL — ABNORMAL HIGH (ref 65–99)
Potassium: 4.1 mmol/L (ref 3.5–5.1)
Sodium: 134 mmol/L — ABNORMAL LOW (ref 135–145)
Total Protein: 6.6 g/dL (ref 6.5–8.1)

## 2015-12-28 LAB — FOLATE RBC
FOLATE, HEMOLYSATE: 519 ng/mL
Folate, RBC: 1966 ng/mL (ref >498)
HEMATOCRIT: 26.4 % — AB (ref 37.5–51.0)

## 2015-12-28 LAB — CLOTEST (H. PYLORI), BIOPSY: HELICOBACTER SCREEN: NEGATIVE — AB

## 2015-12-28 LAB — HEMOGLOBIN AND HEMATOCRIT, BLOOD
HCT: 29 % — ABNORMAL LOW (ref 39.0–52.0)
Hemoglobin: 8.4 g/dL — ABNORMAL LOW (ref 13.0–17.0)

## 2015-12-28 MED ORDER — PANTOPRAZOLE SODIUM 40 MG PO TBEC: 40.0000 mg | DELAYED_RELEASE_TABLET | Freq: Every day | ORAL | 0 refills | Status: DC

## 2015-12-28 MED ORDER — PANTOPRAZOLE SODIUM 40 MG PO TBEC: 40.0000 mg | DELAYED_RELEASE_TABLET | Freq: Every day | ORAL | 0 refills | Status: AC

## 2015-12-28 MED ORDER — PROPRANOLOL HCL 10 MG PO TABS: 10.0000 mg | ORAL_TABLET | Freq: Three times a day (TID) | ORAL | 0 refills | Status: AC

## 2015-12-28 MED ORDER — FUROSEMIDE 40 MG PO TABS: 40.0000 mg | ORAL_TABLET | Freq: Every day | ORAL | 0 refills | Status: DC

## 2015-12-28 MED ORDER — FERROUS SULFATE 325 (65 FE) MG PO TABS: 325.0000 mg | ORAL_TABLET | Freq: Two times a day (BID) | ORAL | 0 refills | Status: DC

## 2015-12-28 MED ORDER — SPIRONOLACTONE 100 MG PO TABS: 100.0000 mg | ORAL_TABLET | Freq: Every day | ORAL | 0 refills | Status: DC

## 2015-12-28 MED ORDER — BISACODYL 5 MG PO TBEC: 5.0000 mg | DELAYED_RELEASE_TABLET | Freq: Every day | ORAL | 0 refills | Status: AC | PRN

## 2015-12-28 NOTE — Care Management Note (Addendum)
Case Management Note  Patient Details  Name: Justin Scott MRN: [id number] Date of Birth: May 19, 1950  Subjective/Objective:    Anemia, cirrhosis, anasarca, DM                Action/Plan: Discharge Planning: AVS reviewed: NCM spoke to pt and states he lives at home alone. Sister, Justin Scott [phone] assist him at home as needed. Pt has glucometer at home and does check his blood sugars. Able to afford his medications.  PCP-ELKINS, Curly RimWILSON OLIVER MD  [phone] NCM spoke to pt and [facility]. [facility] Pt has RW and cane at home. Contacted [facility] with new referral.   [phone] [facility] d/t PCP not signing off on orders. Contacted PCP's office and spoke to office. PCP appt arranged for 12/31/2015 at 12:30 pm. Dr Jeannetta NapElkins will arrange [facility] from office if needed.   Expected Discharge Date:  12/28/2015               Expected Discharge Plan:  Home/Self Care  In-House Referral:  NA  Discharge planning Services  CM Consult  Post Acute Care Choice:  NA Choice offered to:  NA  DME Arranged:  N/A DME Agency:  NA  HH Arranged:  NA HH Agency:  NA  Status of Service:  Completed, signed off  If discussed at Long Length of Stay Meetings, dates discussed:    Additional Comments:  Elliot CousinShavis, Reino Lybbert Ellen, RN 12/28/2015, 11:55 AM

## 2015-12-28 NOTE — Discharge Instructions (Signed)
Follow with Primary MD Kaleen MaskELKINS,WILSON OLIVER, MD in 7 days   Get CBC, CMP, 2 view Chest X ray checked  by Primary MD or SNF MD in 5-7 days ( we routinely change or add medications that can affect your baseline labs and fluid status, therefore we recommend that you get the mentioned basic workup next visit with your PCP, your PCP may decide not to get them or add new tests based on their clinical decision)   Activity: As tolerated with Full fall precautions use walker/cane & assistance as needed   Disposition Home     Diet:   Heart Healthy Low Carb.  Check your Weight same time everyday, if you gain over 2 pounds, or you develop in leg swelling, experience more shortness of breath or chest pain, call your Primary MD immediately. Follow Cardiac Low Salt Diet and 1.5 lit/day fluid restriction.  Accuchecks 4 times/day, Once in AM empty stomach and then before each meal. Log in all results and show them to your Prim.MD in 3 days. If any glucose reading is under 80 or above 300 call your Prim MD immidiately. Follow Low glucose instructions for glucose under 80 as instructed.   On your next visit with your primary care physician please Get Medicines reviewed and adjusted.   Please request your Prim.MD to go over all Hospital Tests and Procedure/Radiological results at the follow up, please get all Hospital records sent to your Prim MD by signing hospital release before you go home.   If you experience worsening of your admission symptoms, develop shortness of breath, life threatening emergency, suicidal or homicidal thoughts you must seek medical attention immediately by calling 911 or calling your MD immediately  if symptoms less severe.  You Must read complete instructions/literature along with all the possible adverse reactions/side effects for all the Medicines you take and that have been prescribed to you. Take any new Medicines after you have completely understood and accpet all the possible  adverse reactions/side effects.   Do not drive, operate heavy machinery, perform activities at heights, swimming or participation in water activities or provide baby sitting services if your were admitted for syncope or siezures until you have seen by Primary MD or a Neurologist and advised to do so again.  Do not drive when taking Pain medications.    Do not take more than prescribed Pain, Sleep and Anxiety Medications  Special Instructions: If you have smoked or chewed Tobacco  in the last 2 yrs please stop smoking, stop any regular Alcohol  and or any Recreational drug use.  Wear Seat belts while driving.   Please note  You were cared for by a hospitalist during your hospital stay. If you have any questions about your discharge medications or the care you received while you were in the hospital after you are discharged, you can call the unit and asked to speak with the hospitalist on call if the hospitalist that took care of you is not available. Once you are discharged, your primary care physician will handle any further medical issues. Please note that NO REFILLS for any discharge medications will be authorized once you are discharged, as it is imperative that you return to your primary care physician (or establish a relationship with a primary care physician if you do not have one) for your aftercare needs so that they can reassess your need for medications and monitor your lab values.

## 2015-12-28 NOTE — Discharge Summary (Signed)
HUGHES WYNDHAM ZOX:[id number] DOB: 1950-12-29 DOA: 12/25/2015  PCP: Kaleen Mask, MD  Admit date: 12/25/2015  Discharge date: 12/28/2015  Admitted From: Home   Disposition:  none   Recommendations for Outpatient Follow-up:   Follow up with PCP in 1-2 weeks  PCP Please obtain BMP/CBC, 2 view CXR in 1week,  (see Discharge instructions)   PCP Please follow up on the following pending results: None   Home Health: None Equipment/Devices: None  Consultations: GI Discharge Condition: Stable   CODE STATUS: Full   Diet Recommendation: Healthy, low carbohydrate, 1.5 L daily fluid restriction.   Chief Complaint  Patient presents with  . Shortness of Breath     Brief history of present illness from the day of admission and additional interim summary    Landen H Cobleis a 65 y.o.malewith a Past Medical History of cirrhosis, DM who presents with symptomatic anemia, anasarca, anemia likely from either intermittent small bowel AVMs or bleeding variceals.     Hospital issues addressed    1. Severe symptomatic anemia. 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 He is status post total of 3 units packed RBC on 12/26/2015, posttransfusion H&H stable, placed on oral iron, placed on propanolol, continue PPI, Seen by GI and underwent unremarkable EGD with no active bleeding, did have erosive gastritis, incidental peptic stricture along with signs of ESLD, will follow GI closely will be discharged on above dictated medications along with 40 mg of Lasix and 100 mg of Aldactone daily, request PCP to monitor BMP closely for the first few weeks.  2. 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 No MRI needed per GI, AFP per GI if  needed in 2014 was normal, GI following. Underwent paracentesis which showed noninfected fluid consistent with portal hypertension with SAAG of 2.6. Placed on diuretics. Monitor BMP closely.  3. Anasarca. Likely due to #2 above, UA stable, echo stable with chronic diastolic CHF and EF of 60%, please on Lasix and Aldactone, fluid restrict,  4. QTC of 502. 1 g of magnesium, beta blocker, monitor on telemetry.  5. DM type II .  Continue low-carb diet along with home regimen, requested to do CBGs every before meals at bedtime and monitor glycemic control closely, follow with PCP.     Discharge diagnosis     Active Problems:   Hepatic cirrhosis (HCC)   Diabetes mellitus, type 2 (HCC)   Fluid overload   Prolonged Q-T interval on ECG   Absolute anemia   Ascites   Cryptogenic cirrhosis (HCC)   Portal hypertension (HCC)   Abnormal CT of liver   Anemia of chronic disease   Cirrhosis (HCC)   Gastric erosions    Discharge instructions    Discharge Instructions    Discharge instructions    Complete by:  As directed   Follow with Primary MD Kaleen Mask, MD in 7 days   Get CBC, CMP, 2 view Chest X ray checked  by Primary MD or SNF  MD in 5-7 days ( we routinely change or add medications that can affect your baseline labs and fluid status, therefore we recommend that you get the mentioned basic workup next visit with your PCP, your PCP may decide not to get them or add new tests based on their clinical decision)   Activity: As tolerated with Full fall precautions use walker/cane & assistance as needed   Disposition Home     Diet:   Heart Healthy Low Carb.  Check your Weight same time everyday, if you gain over 2 pounds, or you develop in leg swelling, experience more shortness of breath or chest pain, call your Primary MD immediately. Follow Cardiac Low Salt Diet and 1.5 lit/day fluid restriction.  Accuchecks 4 times/day, Once in AM empty stomach and then before each  meal. Log in all results and show them to your Prim.MD in 3 days. If any glucose reading is under 80 or above 300 call your Prim MD immidiately. Follow Low glucose instructions for glucose under 80 as instructed.   On your next visit with your primary care physician please Get Medicines reviewed and adjusted.   Please request your Prim.MD to go over all Hospital Tests and Procedure/Radiological results at the follow up, please get all Hospital records sent to your Prim MD by signing hospital release before you go home.   If you experience worsening of your admission symptoms, develop shortness of breath, life threatening emergency, suicidal or homicidal thoughts you must seek medical attention immediately by calling 911 or calling your MD immediately  if symptoms less severe.  You Must read complete instructions/literature along with all the possible adverse reactions/side effects for all the Medicines you take and that have been prescribed to you. Take any new Medicines after you have completely understood and accpet all the possible adverse reactions/side effects.   Do not drive, operate heavy machinery, perform activities at heights, swimming or participation in water activities or provide baby sitting services if your were admitted for syncope or siezures until you have seen by Primary MD or a Neurologist and advised to do so again.  Do not drive when taking Pain medications.    Do not take more than prescribed Pain, Sleep and Anxiety Medications  Special Instructions: If you have smoked or chewed Tobacco  in the last 2 yrs please stop smoking, stop any regular Alcohol  and or any Recreational drug use.  Wear Seat belts while driving.   Please note  You were cared for by a hospitalist during your hospital stay. If you have any questions about your discharge medications or the care you received while you were in the hospital after you are discharged, you can call the unit and asked  to speak with the hospitalist on call if the hospitalist that took care of you is not available. Once you are discharged, your primary care physician will handle any further medical issues. Please note that NO REFILLS for any discharge medications will be authorized once you are discharged, as it is imperative that you return to your primary care physician (or establish a relationship with a primary care physician if you do not have one) for your aftercare needs so that they can reassess your need for medications and monitor your lab values.   Increase activity slowly    Complete by:  As directed      Discharge Medications     Medication List    STOP taking these medications   pioglitazone 30 MG tablet  Commonly known as:  ACTOS     TAKE these medications   bisacodyl 5 MG EC tablet Commonly known as:  DULCOLAX Take 1 tablet (5 mg total) by mouth daily as needed for moderate constipation.   ferrous sulfate 325 (65 FE) MG tablet Take 1 tablet (325 mg total) by mouth 2 (two) times daily with a meal.   furosemide 40 MG tablet Commonly known as:  LASIX Take 1 tablet (40 mg total) by mouth daily.   glimepiride 4 MG tablet Commonly known as:  AMARYL Take 4 mg by mouth daily with breakfast.   pantoprazole 40 MG tablet Commonly known as:  PROTONIX Take 1 tablet (40 mg total) by mouth daily. What changed:  See the new instructions.   propranolol 10 MG tablet Commonly known as:  INDERAL Take 1 tablet (10 mg total) by mouth 3 (three) times daily.   spironolactone 100 MG tablet Commonly known as:  ALDACTONE Take 1 tablet (100 mg total) by mouth daily.       Follow-up Information    ZEHR, JESSICA D., PA-C Follow up on 01/02/2016.   Specialty:  Gastroenterology Why:  8:30 AM office visit with liver/GI physician assistant.  Contact information: [address] [place] [address] [phone]        Kaleen MaskELKINS,WILSON OLIVER, MD. Schedule an appointment as soon as possible for a  visit in 1 week(s).   Specialty:  Family Medicine Contact information: [address] [place] [address] [phone]           Major procedures and Radiology Reports - PLEASE review detailed and final reports thoroughly  -     Paracentesis with 1.1 L of fluid removed, S a AG 2.6, no signs of infection  EGD    Trivial distal esophageal remnants post prior                            band ligation                           - Incidental peptic stricture of the distal                            esophagus                           - Erosive gastritis status post CLO biopsy                           - Otherwise normal EGD.                           2. CLINICAL IMPRESSION.                           - End-stage liver disease with portal hypertension                            and ascites                           - MELD score 12   Dg Chest 2 View  Result Date: 12/25/2015 CLINICAL DATA:  Cryptogenic cirrhosis,  ascites, now with increased abdominal swelling, peripheral edema, wheezing, and shortness of breath for the past 3 days. EXAM: CHEST  2 VIEW COMPARISON:  Portable chest x-ray of March 09, 2013 FINDINGS: The lungs are mildly hypoinflated. The interstitial markings are increased. The cardiac silhouette is enlarged. The central pulmonary vascularity is prominent. There are small pleural effusions blunting the costophrenic angles bilaterally. There is calcification in the wall of the aortic arch. There is mild multilevel degenerative disc disease of the thoracic spine. IMPRESSION: Mild CHF.  Small bilateral pleural effusions. Aortic atherosclerosis Electronically Signed   By: David  [place] M.D.   On: 12/25/2015 10:10   Ct Abdomen Pelvis W Contrast  Result Date: 12/26/2015 CLINICAL DATA:  Anemia.  Cirrhosis and abdominal distention. EXAM: CT ABDOMEN AND PELVIS WITH CONTRAST TECHNIQUE: Multidetector CT imaging of the abdomen and pelvis was performed using the standard protocol  following bolus administration of intravenous contrast. CONTRAST:  ISOVUE-300 IOPAMIDOL (ISOVUE-300) INJECTION 61% COMPARISON:  03/09/2013 FINDINGS: Lower chest: There are small bilateral pleural effusions identified. Hepatobiliary: There is diffuse atrophy of the liver which has a nodular contour or compatible with advanced cirrhosis. No suspicious focal liver abnormality identified. Mild gallbladder wall edema. No biliary dilatation. Pancreas: No inflammation or mass identified. Spleen: The spleen is enlarged with a length of 17 cm. No focal splenic abnormality. Adrenals/Urinary Tract: Normal appearance of the adrenal glands. The kidneys both appear normal. Normal appearance of the urinary bladder. Stomach/Bowel: The stomach is normal. There is no pathologic dilatation of the large or small bowel loops. Vascular/Lymphatic: Calcified atherosclerotic disease involves the abdominal aorta. No aneurysm. Periaortic lymph node measures 11 mm, image number 18 of series 2. Portacaval lymph node is enlarged measuring 1.4 cm. Multiple prominent mesenteric lymph nodes are also identified. There is re- cannulization of the umbilical vein. 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 Reproductive: No mass identified. Other: Moderate to marked amount of abdominal ascites is identified. Musculoskeletal: No aggressive lytic or sclerotic bone lesions identified. IMPRESSION: 1. Advanced changes of cirrhosis. 2. Stigmata of portal venous hypertension including ascites, splenomegaly and varicosities. 3. Aortic atherosclerosis 4. Borderline enlarged upper abdominal lymph nodes may be nonspecific in the setting of cirrhosis. Electronically Signed   By: Signa Kell M.D.   On: 12/26/2015 13:56   US Paracentesis  Result Date: 12/26/2015 INDICATION: History of cirrhosis, now with symptomatic ascites. Please perform ultrasound-guided paracentesis for therapeutic and diagnostic purposes. EXAM: ULTRASOUND-GUIDED PARACENTESIS  COMPARISON:  None. MEDICATIONS: 10 cc 1% lidocaine COMPLICATIONS: None immediate. TECHNIQUE: Informed written consent was obtained from the patient after a discussion of the risks, benefits and alternatives to treatment. A timeout was performed prior to the initiation of the procedure. Initial ultrasound scanning demonstrates a large amount of ascites within the right lower abdominal quadrant. The right lower abdomen was prepped and draped in the usual sterile fashion. 1% lidocaine with epinephrine was used for local anesthesia. Under direct ultrasound guidance, a 19 gauge, 7-cm, Yueh catheter was introduced. An ultrasound image was saved for documentation purposed. The paracentesis was performed. The catheter was removed and a dressing was applied. The patient tolerated the procedure well without immediate post procedural complication. FINDINGS: A total of approximately 1.1 liters of creamy colored fluid was removed. Samples were sent to the laboratory as requested by the clinical team. IMPRESSION: Successful ultrasound-guided paracentesis yielding 1.1 liters of peritoneal fluid. Read by: Robet Leu [facility] Electronically Signed   By: Simonne Come M.D.   On: 12/25/2015 16:11  Micro Results     Recent Results (from the past 240 hour(s))  Gram stain     Status: None   Collection Time: 12/25/15  3:54 PM  Result Value Ref Range Status   Specimen Description FLUID PERITONEAL ABDOMEN  Final   Special Requests NONE  Final   Gram Stain   Final    WBC PRESENT, PREDOMINANTLY MONONUCLEAR NO ORGANISMS SEEN CYTOSPIN    Report Status 12/25/2015 FINAL  Final  Culture, body fluid-bottle     Status: None (Preliminary result)   Collection Time: 12/25/15  3:54 PM  Result Value Ref Range Status   Specimen Description FLUID PERITONEAL ABDOMEN  Final   Special Requests BOTTLES DRAWN AEROBIC AND ANAEROBIC 10CC  Final   Culture NO GROWTH 3 DAYS  Final   Report Status PENDING  Incomplete    Today   Subjective     Evaristo Tsuda today has no headache,no chest abdominal pain,no new weakness tingling or numbness, feels much better wants to go home today.    Objective   Blood pressure (!) 125/49, pulse 66, temperature 98.6 F (37 C), temperature source Oral, resp. rate 17, height 5\' 6"  (1.676 m), weight 103.4 kg (227 lb 14.4 oz), SpO2 98 %.   Intake/Output Summary (Last 24 hours) at 12/28/15 1024 Last data filed at 12/28/15 0950  Gross per 24 hour  Intake             1020 ml  Output             3676 ml  Net            -2656 ml    Exam Awake Alert, Oriented x 3, No new F.N deficits, Normal affect [place].AT,PERRAL Supple Neck,No JVD, No cervical lymphadenopathy appriciated.  Symmetrical Chest wall movement, Good air movement bilaterally, CTAB RRR,No Gallops,Rubs or new Murmurs, No Parasternal Heave +ve B.Sounds, Abd Soft but mildly distended, Non tender, No organomegaly appriciated, No rebound -guarding or rigidity. No Cyanosis, Clubbing, 1+ leg edema, No new Rash or bruise   Data Review   CBC w Diff:  Lab Results  Component Value Date   WBC 5.8 12/27/2015   HGB 8.4 (L) 12/28/2015   HCT 29.0 (L) 12/28/2015   PLT 109 (L) 12/27/2015   LYMPHOPCT 16 12/25/2015   MONOPCT 16 12/25/2015   EOSPCT 2 12/25/2015   BASOPCT 1 12/25/2015    CMP:  Lab Results  Component Value Date   NA 134 (L) 12/28/2015   K 4.1 12/28/2015   CL 99 (L) 12/28/2015   CO2 26 12/28/2015   BUN 15 12/28/2015   CREATININE 0.91 12/28/2015   PROT 6.6 12/28/2015   ALBUMIN 2.5 (L) 12/28/2015   BILITOT 0.9 12/28/2015   ALKPHOS 76 12/28/2015   AST 24 12/28/2015   ALT 13 (L) 12/28/2015  .   Total Time in preparing paper work, data evaluation and todays exam - 35 minutes  Leroy Sea M.D on 12/28/2015 at 10:24 AM  [facility]   Office  [phone]

## 2015-12-28 NOTE — Evaluation (Signed)
Physical Therapy Evaluation Patient Details Name: Justin Scott MRN: [id number] DOB: 10-22-1950 Today's Date: 12/28/2015   History of Present Illness  BRAULIO KIEDROWSKI is a 65 y.o. male with a Past Medical History of cirrhosis, DM who presents with symptomatic anemia, anasarca, anemia likely from either intermittent small bowel AVMs or bleeding variceals.    Clinical Impression  Patient presents with decreased mobility due to deficits listed in PT problem list.  Mainly imbalance and general weakness.  Patient will benefit from follow up [place] and [place] aide for bathing assistance.  No further skilled PT in acute setting as planned d/c home today.    Follow Up Recommendations Home health PT (bath aide)    Equipment Recommendations  None recommended by PT    Recommendations for Other Services       Precautions / Restrictions Precautions Precautions: Fall      Mobility  Bed Mobility               General bed mobility comments: sitting on EOB  Transfers Overall transfer level: Modified independent Equipment used: None             General transfer comment: increased time, UE support needed  Ambulation/Gait Ambulation/Gait assistance: Supervision;Min guard Ambulation Distance (Feet): 180 Feet Assistive device: None Gait Pattern/deviations: Step-through pattern;Wide base of support;Drifts right/left;Decreased stride length     General Gait Details: R LE with some coordination difficulty, drifts to side with head turns, minguard for safety, but no LOB  Stairs            Wheelchair Mobility    Modified Rankin (Stroke Patients Only)       Balance Overall balance assessment: Needs assistance   Sitting balance-Leahy Scale: Good       Standing balance-Leahy Scale: Good Standing balance comment: moves without devices, but some imbalance evident                             Pertinent Vitals/Pain Pain Assessment: No/denies pain    Home  Living Family/patient expects to be discharged to:: Private residence Living Arrangements: Alone Available Help at Discharge: Family Type of Home: Mobile home Home Access: Stairs to enter Entrance Stairs-Rails: Right;Left;Can reach both Entrance Stairs-Number of Steps: 4 Home Layout: One level Home Equipment: Cane - single point      Prior Function Level of Independence: Independent         Comments: 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     Hand Dominance        Extremity/Trunk Assessment               Lower Extremity Assessment: Overall WFL for tasks assessed      Cervical / Trunk Assessment: Kyphotic  Communication   Communication: HOH  Cognition Arousal/Alertness: Awake/alert Behavior During Therapy: Flat affect Overall Cognitive Status: Within Functional Limits for tasks assessed                      General Comments General comments (skin integrity, edema, etc.): Patient reports grass entry to home, has shoes, but sister took them home,  States he has almost fallen in shower, agrees to rubber mat, spoke with pt about getting grabbar to clamp on edge of tub, states he thinks his wife's walker is in the storage shed. Discussed getting sister to bring in case more unsteady at night    Exercises  Assessment/Plan    PT Assessment All further PT needs can be met in the next venue of care  PT Diagnosis Abnormality of gait   PT Problem List Decreased balance;Decreased mobility;Decreased safety awareness;Decreased coordination  PT Treatment Interventions     PT Goals (Current goals can be found in the Care Plan section) Acute Rehab PT Goals PT Goal Formulation: All assessment and education complete, DC therapy    Frequency     Barriers to discharge        Co-evaluation               End of Session Equipment Utilized During Treatment: Gait belt Activity Tolerance: Patient tolerated treatment well Patient left: in bed;with call  bell/phone within reach Nurse Communication: Other (comment) (requesting meds to assist with bowel movement)         Time: 8280-0349 PT Time Calculation (min) (ACUTE ONLY): 20 min   Charges:   PT Evaluation $PT Eval Moderate Complexity: 1 Procedure     PT G CodesReginia Naas Jan 26, 2016, 1:03 PM  Magda Kiel, [place] 2016/01/26

## 2015-12-28 NOTE — Progress Notes (Signed)
Pt has orders to be discharged. Discharge instructions given and pt has no additional questions at this time. Medication regimen reviewed and pt educated. Pt verbalized understanding and has no additional questions. Telemetry box removed. IV removed and site in good condition. Pt stable and waiting for transportation.   Stpehen Petitjean RN 

## 2015-12-29 NOTE — Anesthesia Postprocedure Evaluation (Signed)
Anesthesia Post Note  Patient: Justin Scott  Procedure(s) Performed: Procedure(s) (LRB): ESOPHAGOGASTRODUODENOSCOPY (EGD) (N/A)  Patient location during evaluation: PACU Anesthesia Type: MAC Level of consciousness: awake and alert Pain management: pain level controlled Vital Signs Assessment: post-procedure vital signs reviewed and stable Respiratory status: spontaneous breathing Cardiovascular status: stable Anesthetic complications: no     Last Vitals:  Vitals:   12/27/15 2101 12/28/15 0542  BP: 104/62 (!) 125/49  Pulse: 69 66  Resp: 17 17  Temp: 36.9 C 37 C    Last Pain:  Vitals:   12/28/15 0542  TempSrc: Oral   Pain Goal:                 [name]

## 2015-12-30 LAB — CULTURE, BODY FLUID W GRAM STAIN -BOTTLE

## 2015-12-30 LAB — CULTURE, BODY FLUID-BOTTLE: CULTURE: NO GROWTH

## 2016-01-02 ENCOUNTER — Ambulatory Visit (INDEPENDENT_AMBULATORY_CARE_PROVIDER_SITE_OTHER): Payer: BC Managed Care – PPO | Admitting: Gastroenterology

## 2016-01-02 ENCOUNTER — Encounter: Payer: Self-pay | Admitting: Gastroenterology

## 2016-01-02 ENCOUNTER — Other Ambulatory Visit (INDEPENDENT_AMBULATORY_CARE_PROVIDER_SITE_OTHER): Payer: BC Managed Care – PPO

## 2016-01-02 VITALS — BP 130/68 | HR 66 | Ht 66.0 in | Wt 228.5 lb

## 2016-01-02 DIAGNOSIS — R188 Other ascites: Secondary | ICD-10-CM | POA: Diagnosis not present

## 2016-01-02 DIAGNOSIS — K7469 Other cirrhosis of liver: Secondary | ICD-10-CM

## 2016-01-02 LAB — CBC WITH DIFFERENTIAL/PLATELET
BASOS ABS: 0 10*3/uL (ref 0.0–0.1)
BASOS PCT: 0.6 % (ref 0.0–3.0)
Eosinophils Absolute: 0.1 10*3/uL (ref 0.0–0.7)
Eosinophils Relative: 1.8 % (ref 0.0–5.0)
HEMATOCRIT: 31.1 % — AB (ref 39.0–52.0)
HEMOGLOBIN: 10 g/dL — AB (ref 13.0–17.0)
LYMPHS PCT: 14 % (ref 12.0–46.0)
Lymphs Abs: 1.1 10*3/uL (ref 0.7–4.0)
MCHC: 32 g/dL (ref 30.0–36.0)
MCV: 71.9 fl — AB (ref 78.0–100.0)
MONOS PCT: 14.1 % — AB (ref 3.0–12.0)
Monocytes Absolute: 1.1 10*3/uL — ABNORMAL HIGH (ref 0.1–1.0)
NEUTROS ABS: 5.3 10*3/uL (ref 1.4–7.7)
Neutrophils Relative %: 69.5 % (ref 43.0–77.0)
Platelets: 162 10*3/uL (ref 150.0–400.0)
RBC: 4.33 Mil/uL (ref 4.22–5.81)
RDW: 27.9 % — AB (ref 11.5–15.5)
WBC: 7.6 10*3/uL (ref 4.0–10.5)

## 2016-01-02 LAB — COMPREHENSIVE METABOLIC PANEL
ALBUMIN: 3 g/dL — AB (ref 3.5–5.2)
ALT: 10 U/L (ref 0–53)
AST: 21 U/L (ref 0–37)
Alkaline Phosphatase: 92 U/L (ref 39–117)
BILIRUBIN TOTAL: 1.1 mg/dL (ref 0.2–1.2)
BUN: 15 mg/dL (ref 6–23)
CALCIUM: 8.5 mg/dL (ref 8.4–10.5)
CO2: 26 meq/L (ref 19–32)
Chloride: 100 mEq/L (ref 96–112)
Creatinine, Ser: 0.96 mg/dL (ref 0.40–1.50)
GFR: 84.14 mL/min (ref >60.00)
Glucose, Bld: 174 mg/dL — ABNORMAL HIGH (ref 70–99)
Potassium: 4.2 mEq/L (ref 3.5–5.1)
Sodium: 134 mEq/L — ABNORMAL LOW (ref 135–145)
Total Protein: 7.5 g/dL (ref 6.0–8.3)

## 2016-01-02 LAB — PROTIME-INR
INR: 1.8 ratio — AB (ref 0.8–1.0)
PROTHROMBIN TIME: 19.4 s — AB (ref 9.6–13.1)

## 2016-01-02 NOTE — Patient Instructions (Signed)
Please go to the basement level to have your labs drawn.  Stay on a 2 gram sodium diet ( 2000 mg ) daily. Weigh yourself daily and write it down, ( keep a log).   You have been scheduled for an abdominal ultrasound paracentesis at [facility]  on Thursday 9-14 at 2:00 PM. Please arrive at 1:45 PM to your appointment for registration.  [facility] to [facility] [address], [address] [facility], go to radiology.  There is valet parking.

## 2016-01-03 ENCOUNTER — Ambulatory Visit (HOSPITAL_COMMUNITY)
Admission: RE | Admit: 2016-01-03 | Discharge: 2016-01-03 | Disposition: A | Discharge status: Home / Self Care | Payer: BC Managed Care – PPO | Source: Ambulatory Visit | Attending: Gastroenterology | Admitting: Gastroenterology

## 2016-01-03 DIAGNOSIS — K7469 Other cirrhosis of liver: Secondary | ICD-10-CM | POA: Diagnosis present

## 2016-01-03 DIAGNOSIS — R188 Other ascites: Secondary | ICD-10-CM | POA: Insufficient documentation

## 2016-01-03 LAB — BODY FLUID CELL COUNT WITH DIFFERENTIAL
Eos, Fluid: 0 %
Lymphs, Fluid: 62 %
Monocyte-Macrophage-Serous Fluid: 28 % — ABNORMAL LOW (ref 50–90)
NEUTROPHIL FLUID: 10 % (ref 0–25)
OTHER CELLS FL: 0 %
Total Nucleated Cell Count, Fluid: 1625 cu mm — ABNORMAL HIGH (ref 0–1000)

## 2016-01-03 LAB — GRAM STAIN

## 2016-01-03 LAB — AFP TUMOR MARKER: AFP TUMOR MARKER: 3 ng/mL (ref <6.1)

## 2016-01-03 MED ORDER — LIDOCAINE HCL (PF) 1 % IJ SOLN
INTRAMUSCULAR | Status: AC
  Filled 2016-01-03: qty 10

## 2016-01-03 NOTE — Procedures (Signed)
  US guided RLQ paracentesis  3.9L Creamy yellow color  Sent for labs per MD

## 2016-01-04 ENCOUNTER — Ambulatory Visit (HOSPITAL_COMMUNITY): Payer: BC Managed Care – PPO

## 2016-01-04 LAB — PATHOLOGIST SMEAR REVIEW: Path Review: REACTIVE

## 2016-01-07 ENCOUNTER — Ambulatory Visit
Admission: RE | Admit: 2016-01-07 | Discharge: 2016-01-07 | Disposition: A | Discharge status: Home / Self Care | Payer: BC Managed Care – PPO | Source: Ambulatory Visit | Attending: Family Medicine | Admitting: Family Medicine

## 2016-01-07 ENCOUNTER — Other Ambulatory Visit: Payer: Self-pay | Admitting: Family Medicine

## 2016-01-07 DIAGNOSIS — Z09 Encounter for follow-up examination after completed treatment for conditions other than malignant neoplasm: Secondary | ICD-10-CM

## 2016-01-07 NOTE — Progress Notes (Signed)
     01/07/2016 Justin Scott H Stormer [id number] [birth year]   History of Present Illness:  65 year old male here for hospital follow-up of cirrhosis with ascites and lower extremity edema.  Cirrhosis not related to alcohol.  Was in the hospital where he underwent paracentesis with 1.1 L of fluid removed, negative for SBP. Is currently on Lasix 40 g daily, spironolactone 100 mg daily.  Has LE edema as well, which he feels is improved slightly. Underwent EGD for symptomatic anemia. This showed remnants of previous variceal banding.  He was placed on propanolol 10 mg 3 times a day by the hospitalist. He is also on ferrous sulfate 325 mg twice daily. Was Hemoccult negative in the hospital, but did receive 3 units PRBC's.  Does not have any new complaints or concerns at this time, but says that his abdomen still feels quite distended. Denies abdominal pain. Is here today with his brother-in-law.  Current Medications, Allergies, Past Medical History, Past Surgical History, Family History and Social History were reviewed in [facility] electronic medical record.   Physical Exam: BP 130/68   Pulse 66   Ht 5\' 6"  (1.676 m)   Wt 228 lb 8 oz (103.6 kg)   BMI 36.88 kg/m  General: Well developed white male in no acute distress Head: Normocephalic and atraumatic Eyes:  Sclerae anicteric, conjunctiva pink  Ears: Normal auditory acuity Lungs: Clear throughout to auscultation Heart: Regular rate and rhythm Abdomen: Soft, but distended with fluid. Normal bowel sounds.  Non-tender. Musculoskeletal: Symmetrical with no gross deformities  Extremities: 3+ pitting edema noted in B/L LE's Neurological: Alert oriented x 4, grossly non-focal. No asterixis. Psychological:  Alert and cooperative. Normal mood and affect  Assessment and Recommendations: -65 year old male with nonalcoholic cirrhosis of the liver:  Recent imaging without suspicious lesions.  Will check AFP, CMP, PT/INR. 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  Is on propanolol 10 mg 3 times daily that was started by the hospitalist. He can continue this for now. Also continue pantoprazole 40 mg daily. -Ascites/LE edema:  Status post one paracentesis with only 1 L of fluid removed, negative for SBP. On Aldactone 100 mg daily, Lasix 40 mg daily, 2 g sodium diet. He will continue these for now along with remaining himself at home daily and recording these weights.  We will schedule for repeat paracentesis with no more than 4 L removed. Send fluid for cell count, culture, Gram stain.  Will likely need increasing in his diuretics in the future as well. -Symptomatic anemia:  Microcytic. FOBT negativeon 1 of 1 samples in hospital.  S/p PRBC x 3 as inpatient.  On ferrous sulfate BID.  Will recheck CBC today.  *Follow-up in 6-8 weeks.

## 2016-01-08 ENCOUNTER — Telehealth: Payer: Self-pay

## 2016-01-08 DIAGNOSIS — R188 Other ascites: Secondary | ICD-10-CM

## 2016-01-08 LAB — CULTURE, BODY FLUID-BOTTLE: CULTURE: NO GROWTH

## 2016-01-08 LAB — CULTURE, BODY FLUID W GRAM STAIN -BOTTLE

## 2016-01-08 NOTE — Progress Notes (Signed)
I agree with the above note, plan 

## 2016-01-08 NOTE — Telephone Encounter (Signed)
-----   Message from Leta BaptistJessica D Zehr, PA-C sent at 01/07/2016  4:43 PM EDT ----- Please have the patient come in for a repeat BMP on Friday so that we can recheck his renal function after his paracentesis that he had last week. If it is stable then I may increase his diuretics. Please enter BMP for him to have drawn on Friday.  Thank you,  Jess

## 2016-01-08 NOTE — Telephone Encounter (Signed)
Pt notified to have repeat labs Friday, [facility]

## 2016-01-14 ENCOUNTER — Other Ambulatory Visit (INDEPENDENT_AMBULATORY_CARE_PROVIDER_SITE_OTHER): Payer: BC Managed Care – PPO

## 2016-01-14 DIAGNOSIS — R188 Other ascites: Secondary | ICD-10-CM

## 2016-01-14 LAB — BASIC METABOLIC PANEL
BUN: 22 mg/dL (ref 6–23)
CHLORIDE: 101 meq/L (ref 96–112)
CO2: 23 mEq/L (ref 19–32)
Calcium: 8.5 mg/dL (ref 8.4–10.5)
Creatinine, Ser: 1.13 mg/dL (ref 0.40–1.50)
GFR: 69.7 mL/min (ref >60.00)
Glucose, Bld: 151 mg/dL — ABNORMAL HIGH (ref 70–99)
POTASSIUM: 4.2 meq/L (ref 3.5–5.1)
SODIUM: 132 meq/L — AB (ref 135–145)

## 2016-01-20 DIAGNOSIS — D696 Thrombocytopenia, unspecified: Secondary | ICD-10-CM

## 2016-01-20 DIAGNOSIS — N179 Acute kidney failure, unspecified: Secondary | ICD-10-CM

## 2016-01-20 HISTORY — DX: Acute kidney failure, unspecified: N17.9

## 2016-01-20 HISTORY — DX: Thrombocytopenia, unspecified: D69.6

## 2016-01-21 ENCOUNTER — Telehealth: Payer: Self-pay | Admitting: Gastroenterology

## 2016-01-21 NOTE — Telephone Encounter (Signed)
Please confirm his current aldactone and lasix dosing with im. I think he is now taking lasix 40 once daily and aldactone 200 (total daily dose after the adjustments by Dr. Jeannetta NapElkins). I agree with increasing his diuretics; he can stay on the aldactone 200mg  daily  (100mg  twice daily, indefinitely... Not just for the next 3 days) and increase him to lasix 40mg  twice daily.  He needs bmet in 3 days  He should have next available ROV with any provider here.  He should go to the ER if his breathing worsens.

## 2016-01-21 NOTE — Telephone Encounter (Signed)
Confirmed dosages of Aldactone and Lasix. Patient will take the medications now. Agrees to call for a family member or 911 if he worsens or decides he needs to go to the ER. He is unable to speak but 2 to 3 words at a time due to his SOB but refuses to go to the Er presently.

## 2016-01-21 NOTE — Telephone Encounter (Signed)
Spoke with Orlie PollenLynne from Dr Jeannetta NapElkins office. Mr Justin Scott came in to see the NP. He has complaints of increased SOB and increased abdominal girth. The Aldactone was increased from 100 mg daily to 200 mg in 4 divided doses for the next 3 days. Patient declined to go to the ER. No openings on schedules until 01/30/16. Please advise.

## 2016-01-21 NOTE — Telephone Encounter (Signed)
Larita FifeLynn from [facility] states that patient is in office now and Dr. Jeannetta NapElkins is wanting patient to be seen this afternoon by Dr. Christella HartiganJacobs. [phone]

## 2016-01-22 ENCOUNTER — Encounter (HOSPITAL_COMMUNITY): Payer: Self-pay | Admitting: Emergency Medicine

## 2016-01-22 ENCOUNTER — Emergency Department (HOSPITAL_COMMUNITY): Payer: BC Managed Care – PPO

## 2016-01-22 ENCOUNTER — Observation Stay (HOSPITAL_COMMUNITY)
Admission: EM | Admit: 2016-01-22 | Discharge: 2016-01-23 | Disposition: A | Discharge status: Home / Self Care | Payer: BC Managed Care – PPO | Attending: Internal Medicine | Admitting: Internal Medicine

## 2016-01-22 ENCOUNTER — Telehealth: Payer: Self-pay | Admitting: Gastroenterology

## 2016-01-22 ENCOUNTER — Observation Stay (HOSPITAL_COMMUNITY): Payer: BC Managed Care – PPO

## 2016-01-22 DIAGNOSIS — Z91199 Patient's noncompliance with other medical treatment and regimen due to unspecified reason: Secondary | ICD-10-CM

## 2016-01-22 DIAGNOSIS — N179 Acute kidney failure, unspecified: Secondary | ICD-10-CM | POA: Diagnosis not present

## 2016-01-22 DIAGNOSIS — E119 Type 2 diabetes mellitus without complications: Secondary | ICD-10-CM | POA: Diagnosis not present

## 2016-01-22 DIAGNOSIS — Z9119 Patient's noncompliance with other medical treatment and regimen: Secondary | ICD-10-CM

## 2016-01-22 DIAGNOSIS — Z79899 Other long term (current) drug therapy: Secondary | ICD-10-CM | POA: Insufficient documentation

## 2016-01-22 DIAGNOSIS — R0602 Shortness of breath: Secondary | ICD-10-CM

## 2016-01-22 DIAGNOSIS — D638 Anemia in other chronic diseases classified elsewhere: Secondary | ICD-10-CM | POA: Diagnosis not present

## 2016-01-22 DIAGNOSIS — K7460 Unspecified cirrhosis of liver: Principal | ICD-10-CM

## 2016-01-22 DIAGNOSIS — Z87891 Personal history of nicotine dependence: Secondary | ICD-10-CM | POA: Insufficient documentation

## 2016-01-22 DIAGNOSIS — I509 Heart failure, unspecified: Secondary | ICD-10-CM | POA: Insufficient documentation

## 2016-01-22 DIAGNOSIS — R188 Other ascites: Secondary | ICD-10-CM

## 2016-01-22 DIAGNOSIS — Z7984 Long term (current) use of oral hypoglycemic drugs: Secondary | ICD-10-CM | POA: Diagnosis not present

## 2016-01-22 DIAGNOSIS — E871 Hypo-osmolality and hyponatremia: Secondary | ICD-10-CM | POA: Diagnosis present

## 2016-01-22 DIAGNOSIS — M25552 Pain in left hip: Secondary | ICD-10-CM | POA: Diagnosis present

## 2016-01-22 DIAGNOSIS — D696 Thrombocytopenia, unspecified: Secondary | ICD-10-CM

## 2016-01-22 HISTORY — DX: Thrombocytopenia, unspecified: D69.6

## 2016-01-22 HISTORY — DX: Acute kidney failure, unspecified: N17.9

## 2016-01-22 LAB — MAGNESIUM: MAGNESIUM: 1.9 mg/dL (ref 1.7–2.4)

## 2016-01-22 LAB — GRAM STAIN

## 2016-01-22 LAB — BODY FLUID CELL COUNT WITH DIFFERENTIAL
Eos, Fluid: 0 %
Lymphs, Fluid: 48 %
MONOCYTE-MACROPHAGE-SEROUS FLUID: 5 % — AB (ref 50–90)
Neutrophil Count, Fluid: 47 % — ABNORMAL HIGH (ref 0–25)
Total Nucleated Cell Count, Fluid: 919 cu mm (ref 0–1000)

## 2016-01-22 LAB — CBC WITH DIFFERENTIAL/PLATELET
BASOS ABS: 0 10*3/uL (ref 0.0–0.1)
BASOS PCT: 0 %
Eosinophils Absolute: 0.1 10*3/uL (ref 0.0–0.7)
Eosinophils Relative: 1 %
HEMATOCRIT: 40.3 % (ref 39.0–52.0)
HEMOGLOBIN: 12.5 g/dL — AB (ref 13.0–17.0)
LYMPHS PCT: 10 %
Lymphs Abs: 0.8 10*3/uL (ref 0.7–4.0)
MCH: 25.2 pg — ABNORMAL LOW (ref 26.0–34.0)
MCHC: 31 g/dL (ref 30.0–36.0)
MCV: 81.1 fL (ref 78.0–100.0)
MONOS PCT: 15 %
Monocytes Absolute: 1.2 10*3/uL — ABNORMAL HIGH (ref 0.1–1.0)
NEUTROS PCT: 74 %
Neutro Abs: 5.8 10*3/uL (ref 1.7–7.7)
Platelets: 121 10*3/uL — ABNORMAL LOW (ref 150–400)
RBC: 4.97 MIL/uL (ref 4.22–5.81)
RDW: 28.3 % — ABNORMAL HIGH (ref 11.5–15.5)
WBC: 7.9 10*3/uL (ref 4.0–10.5)

## 2016-01-22 LAB — I-STAT TROPONIN, ED: Troponin i, poc: 0.01 ng/mL (ref 0.00–0.08)

## 2016-01-22 LAB — COMPREHENSIVE METABOLIC PANEL
ALBUMIN: 2.4 g/dL — AB (ref 3.5–5.0)
ALK PHOS: 73 U/L (ref 38–126)
ALT: 13 U/L — AB (ref 17–63)
AST: 32 U/L (ref 15–41)
Anion gap: 7 (ref 5–15)
BUN: 29 mg/dL — AB (ref 6–20)
CALCIUM: 8.9 mg/dL (ref 8.9–10.3)
CO2: 23 mmol/L (ref 22–32)
CREATININE: 1.36 mg/dL — AB (ref 0.61–1.24)
Chloride: 102 mmol/L (ref 101–111)
GFR calc Af Amer: >60 mL/min (ref >60)
GFR calc non Af Amer: 54 mL/min — ABNORMAL LOW (ref >60)
GLUCOSE: 128 mg/dL — AB (ref 65–99)
Potassium: 4.6 mmol/L (ref 3.5–5.1)
SODIUM: 132 mmol/L — AB (ref 135–145)
Total Bilirubin: 1.3 mg/dL — ABNORMAL HIGH (ref 0.3–1.2)
Total Protein: 7.2 g/dL (ref 6.5–8.1)

## 2016-01-22 LAB — PROTEIN, BODY FLUID

## 2016-01-22 LAB — PROTIME-INR
INR: 1.81
Prothrombin Time: 21.2 seconds — ABNORMAL HIGH (ref 11.4–15.2)

## 2016-01-22 LAB — BRAIN NATRIURETIC PEPTIDE: B Natriuretic Peptide: 52 pg/mL (ref 0.0–100.0)

## 2016-01-22 LAB — PHOSPHORUS: PHOSPHORUS: 4 mg/dL (ref 2.5–4.6)

## 2016-01-22 LAB — GLUCOSE, CAPILLARY
GLUCOSE-CAPILLARY: 154 mg/dL — AB (ref 65–99)
GLUCOSE-CAPILLARY: 61 mg/dL — AB (ref 65–99)
GLUCOSE-CAPILLARY: 81 mg/dL (ref 65–99)
GLUCOSE-CAPILLARY: 86 mg/dL (ref 65–99)

## 2016-01-22 LAB — CBG MONITORING, ED: GLUCOSE-CAPILLARY: 85 mg/dL (ref 65–99)

## 2016-01-22 LAB — ALBUMIN, FLUID (OTHER): Albumin, Fluid: 1.5 g/dL

## 2016-01-22 LAB — LACTATE DEHYDROGENASE, PLEURAL OR PERITONEAL FLUID: LD, Fluid: 62 U/L — ABNORMAL HIGH (ref 3–23)

## 2016-01-22 MED ORDER — HYDRALAZINE HCL 20 MG/ML IJ SOLN: 10.0000 mg | Freq: Four times a day (QID) | INTRAMUSCULAR | Status: DC | PRN

## 2016-01-22 MED ORDER — PROPRANOLOL HCL 10 MG PO TABS
10.0000 mg | ORAL_TABLET | Freq: Three times a day (TID) | ORAL | Status: DC
  Administered 2016-01-22 – 2016-01-23 (×2): 10 mg via ORAL
  Filled 2016-01-22 (×4): qty 1

## 2016-01-22 MED ORDER — ENOXAPARIN SODIUM 40 MG/0.4ML ~~LOC~~ SOLN
40.0000 mg | SUBCUTANEOUS | Status: DC
  Administered 2016-01-22: 40 mg via SUBCUTANEOUS
  Filled 2016-01-22: qty 0.4

## 2016-01-22 MED ORDER — PANTOPRAZOLE SODIUM 40 MG PO TBEC
40.0000 mg | DELAYED_RELEASE_TABLET | Freq: Every day | ORAL | Status: DC
  Administered 2016-01-22 – 2016-01-23 (×2): 40 mg via ORAL
  Filled 2016-01-22 (×2): qty 1

## 2016-01-22 MED ORDER — DOCUSATE SODIUM 100 MG PO CAPS
100.0000 mg | ORAL_CAPSULE | Freq: Two times a day (BID) | ORAL | Status: DC
  Administered 2016-01-22 – 2016-01-23 (×2): 100 mg via ORAL
  Filled 2016-01-22 (×3): qty 1

## 2016-01-22 MED ORDER — DEXTROSE 50 % IV SOLN: 25.0000 mL | Freq: Once | INTRAVENOUS | Status: DC

## 2016-01-22 MED ORDER — FUROSEMIDE 10 MG/ML IJ SOLN
60.0000 mg | Freq: Two times a day (BID) | INTRAMUSCULAR | Status: DC
  Administered 2016-01-22 – 2016-01-23 (×2): 60 mg via INTRAVENOUS
  Filled 2016-01-22 (×2): qty 6

## 2016-01-22 MED ORDER — ACETAMINOPHEN 325 MG PO TABS
650.0000 mg | ORAL_TABLET | Freq: Four times a day (QID) | ORAL | Status: DC | PRN
  Filled 2016-01-22: qty 2

## 2016-01-22 MED ORDER — LIDOCAINE HCL 1 % IJ SOLN
INTRAMUSCULAR | Status: AC
  Filled 2016-01-22: qty 20

## 2016-01-22 MED ORDER — FUROSEMIDE 10 MG/ML IJ SOLN
60.0000 mg | Freq: Once | INTRAMUSCULAR | Status: AC
  Administered 2016-01-22: 60 mg via INTRAVENOUS
  Filled 2016-01-22: qty 6

## 2016-01-22 MED ORDER — INSULIN ASPART 100 UNIT/ML ~~LOC~~ SOLN
0.0000 [IU] | SUBCUTANEOUS | Status: DC
  Administered 2016-01-22: 2 [IU] via SUBCUTANEOUS
  Administered 2016-01-23: 1 [IU] via SUBCUTANEOUS

## 2016-01-22 MED ORDER — BISACODYL 5 MG PO TBEC: 5.0000 mg | DELAYED_RELEASE_TABLET | Freq: Every day | ORAL | Status: DC | PRN

## 2016-01-22 MED ORDER — FERROUS SULFATE 325 (65 FE) MG PO TABS
325.0000 mg | ORAL_TABLET | Freq: Two times a day (BID) | ORAL | Status: DC
  Administered 2016-01-22 – 2016-01-23 (×2): 325 mg via ORAL
  Filled 2016-01-22 (×2): qty 1

## 2016-01-22 NOTE — ED Notes (Signed)
rec'd report; pt to US via stretcher.

## 2016-01-22 NOTE — H&P (Signed)
History and Physical    Justin Scott KTG:[id number] DOB: 1951/01/14 DOA: 01/22/2016   PCP: Leonard Downing, MD   Patient coming from/Resides with: Private residence/lives alone-his wife has been in a nursing facility for 4 years  Admission status: Observation/medical floor  Chief Complaint: Shortness of breath  HPI: Justin Scott is a 65 y.o. male with medical history significant for cryptogenic cirrhosis with associated esophageal varices and prior bleeding incidents, chronic, cytopenias secondary to cirrhosis, recurrent ascites with lower extremity edema, anemia of chronic disease, chronic hyponatremia, diabetes on oral agent and history of prior prolonged QT on ECG. Patient was recently discharged on 9/8 after an admission for symptomatically anemia requiring transfusion. Endoscopy previous admission unrevealing. Patient returns to the ER complaining of shortness of breath. He is a poor historian. He has not had any chest pain. He has mild dyspnea on exertion. He does not weigh himself regularly. According to his sister is at the bedside he apparently does not take his medications as prescribed either. Over the past 3 days he has had increasing abdominal girth and increasing lower extremity edema. He notified his primary care physician on 10/2 instructed him to increase his Lasix to 40 mg twice a day and continue same Aldactone dose. He has not seen any improvement in his symptoms are presented to the ER for further evaluation.  ED Course:  Vital Signs: BP 134/95   Pulse 79   Temp 97.8 F (36.6 C) (Oral)   Resp 22   Wt 98.2 kg (216 lb 8 oz)   SpO2 98%   BMI 34.94 kg/m  Two-view chest x-ray: Subsegmental bibasilar atelectasis and low lung volumes in the chest without evidence of edema or focal infiltrate Lab data: Sodium 132, potassium 4.6, BUN 29, creatinine 1.36, glucose 128, albumin 2.4, ALT 13, total bilirubin 1.3, BNP 52, poc troponin 0.01, WBC 7900 with normal  differential, hemoglobin 12.5, platelets 121,000 Medications and treatments: Lasix 60 mg IV 1  Review of Systems:  In addition to the HPI above,  No Fever-chills, myalgias or other constitutional symptoms No Headache, changes with Vision or hearing, new weakness, tingling, numbness in any extremity, dizziness, dysarthria or word finding difficulty, gait disturbance or imbalance, tremors or seizure activity No problems swallowing food or Liquids, indigestion/reflux, choking or coughing while eating, abdominal pain with or after eating No Chest pain, Cough, palpitations No Abdominal pain, N/V, melena,hematochezia, dark tarry stools, constipation No dysuria, malodorous urine, hematuria or flank pain No new skin rashes, lesions, masses or bruises, No new joint pains, aches, swelling or redness No recent unintentional weight gain or loss No polyuria, polydypsia or polyphagia   Past Medical History:  Diagnosis Date  . Anemia 02/2013   transfused PRBC x 3.  bled in setting of cirrhosis, esophageal varices, portal htn.   . Ascites   . Cirrhosis ([place])   . Congestive heart failure (CHF) ([place])   . Diabetes mellitus without complication [facility])     Past Surgical History:  Procedure Laterality Date  . ESOPHAGOGASTRODUODENOSCOPY N/A 03/10/2013   3 trunks of small to medium sized distal esophagus varices. None had clear signs of recent bleeding however there was hematin in stomach was well as a small amount of fresh red blood at GE junction. There was moderate portal gastropathy throughout the stomach. Dr Ardis Hughs placed 7 variceal ligating bands (one misfired) without immediate complication.  . ESOPHAGOGASTRODUODENOSCOPY N/A 12/27/2015   Procedure: ESOPHAGOGASTRODUODENOSCOPY (EGD);  Surgeon: Irene Shipper, MD;  Location: [facility];  Service: Endoscopy;  Laterality: N/A;    Social History   Social History  . Marital status: Married    Spouse name: N/A  . Number of children: 0  . Years of  education: N/A   Occupational History  . custodian [employer]   Social History Main Topics  . Smoking status: Former Research scientist (life sciences)  . Smokeless tobacco: Never Used  . Alcohol use No  . Drug use: No  . Sexual activity: Not on file   Other Topics Concern  . Not on file   Social History Narrative  . No narrative on file    Mobility: Without assistive devices-still drives his car Work history: Not obtained   No Known Allergies  Family History  Problem Relation Age of Onset  . Diabetes Mother   . Heart disease Father   . Diabetes Father       Prior to Admission medications   Medication Sig Start Date End Date Taking? Authorizing Provider  acetaminophen (TYLENOL) 325 MG tablet Take 650 mg by mouth every 6 (six) hours as needed for moderate pain (in hip).   Yes Historical Provider, MD  bisacodyl (DULCOLAX) 5 MG EC tablet Take 1 tablet (5 mg total) by mouth daily as needed for moderate constipation. 12/28/15  Yes Thurnell Lose, MD  ferrous sulfate 325 (65 FE) MG tablet Take 1 tablet (325 mg total) by mouth 2 (two) times daily with a meal. 12/28/15  Yes Thurnell Lose, MD  furosemide (LASIX) 40 MG tablet Take 1 tablet (40 mg total) by mouth daily. 12/28/15  Yes Thurnell Lose, MD  glimepiride (AMARYL) 4 MG tablet Take 4 mg by mouth daily with breakfast.   Yes Historical Provider, MD  pantoprazole (PROTONIX) 40 MG tablet Take 1 tablet (40 mg total) by mouth daily. 12/28/15  Yes Thurnell Lose, MD  propranolol (INDERAL) 10 MG tablet Take 1 tablet (10 mg total) by mouth 3 (three) times daily. 12/28/15  Yes Thurnell Lose, MD  spironolactone (ALDACTONE) 100 MG tablet Take 1 tablet (100 mg total) by mouth daily. 12/28/15  Yes Thurnell Lose, MD    Physical Exam: Vitals:   01/22/16 1200 01/22/16 1250 01/22/16 1251 01/22/16 1300  BP:  143/89  134/95  Pulse:  80  79  Resp:  18  22  Temp:  97.8 F (36.6 C)    TempSrc:  Oral    SpO2:  98%  98%  Weight: 98.2 kg (216  lb 8 oz)  98.2 kg (216 lb 8 oz)       Constitutional: NAD, calm, Appears mildly uncomfortable secondary to increased abdominal girth Eyes: PERRL, lids and conjunctivae normal ENMT: Mucous membranes are moist. Posterior pharynx clear of any exudate or lesions.Normal dentition.  Neck: normal, supple, no masses, no thyromegaly Respiratory: clear to auscultation bilaterally quite diminished in the bases, no wheezing, no crackles. Normal respiratory effort. No accessory muscle use.  Cardiovascular: Regular rate and rhythm, no murmurs / rubs / gallops. Marked 3-4+ bilateral lower extremity edema. 2+ pedal pulses. No carotid bruits.  Abdomen: no tenderness, no masses palpated. Marked ascites with taut abdomen. Bowel sounds positive but diminished.  Musculoskeletal: no clubbing / cyanosis. No joint deformity upper and lower extremities. Good ROM, no contractures. Normal muscle tone.  Skin: no rashes, lesions, ulcers. No induration Neurologic: CN 2-12 grossly intact. Sensation intact, DTR normal. Strength 5/5 x all 4 extremities.  Psychiatric: Normal judgment and insight. Alert and oriented x 3. Normal mood.  Labs on Admission: I have personally reviewed following labs and imaging studies  CBC:  Recent Labs Lab 01/22/16 1138  WBC 7.9  NEUTROABS 5.8  HGB 12.5*  HCT 40.3  MCV 81.1  PLT 725*   Basic Metabolic Panel:  Recent Labs Lab 01/22/16 1138  NA 132*  K 4.6  CL 102  CO2 23  GLUCOSE 128*  BUN 29*  CREATININE 1.36*  CALCIUM 8.9   GFR: Estimated Creatinine Clearance: 61.8 mL/min (by C-G formula based on SCr of 1.36 mg/dL (H)). Liver Function Tests:  Recent Labs Lab 01/22/16 1138  AST 32  ALT 13*  ALKPHOS 73  BILITOT 1.3*  PROT 7.2  ALBUMIN 2.4*   No results for input(s): LIPASE, AMYLASE in the last 168 hours. No results for input(s): AMMONIA in the last 168 hours. Coagulation Profile: No results for input(s): INR, PROTIME in the last 168 hours. Cardiac  Enzymes: No results for input(s): CKTOTAL, CKMB, CKMBINDEX, TROPONINI in the last 168 hours. BNP (last 3 results) No results for input(s): PROBNP in the last 8760 hours. HbA1C: No results for input(s): HGBA1C in the last 72 hours. CBG: No results for input(s): GLUCAP in the last 168 hours. Lipid Profile: No results for input(s): CHOL, HDL, LDLCALC, TRIG, CHOLHDL, LDLDIRECT in the last 72 hours. Thyroid Function Tests: No results for input(s): TSH, T4TOTAL, FREET4, T3FREE, THYROIDAB in the last 72 hours. Anemia Panel: No results for input(s): VITAMINB12, FOLATE, FERRITIN, TIBC, IRON, RETICCTPCT in the last 72 hours. Urine analysis:    Component Value Date/Time   COLORURINE YELLOW 12/25/2015 1140   APPEARANCEUR CLEAR 12/25/2015 1140   LABSPEC 1.016 12/25/2015 1140   PHURINE 5.5 12/25/2015 1140   GLUCOSEU NEGATIVE 12/25/2015 1140   HGBUR NEGATIVE 12/25/2015 1140   BILIRUBINUR NEGATIVE 12/25/2015 1140   KETONESUR NEGATIVE 12/25/2015 1140   PROTEINUR NEGATIVE 12/25/2015 1140   UROBILINOGEN 0.2 03/09/2013 2318   NITRITE NEGATIVE 12/25/2015 1140   LEUKOCYTESUR NEGATIVE 12/25/2015 1140   Sepsis Labs: _0 (procalcitonin:4,lacticidven:4) )No results found for this or any previous visit (from the past 240 hour(s)).   Radiological Exams on Admission: Dg Chest 2 View  Result Date: 01/22/2016 CLINICAL DATA:  Shortness of breath for 3 days with abdominal swelling. EXAM: CHEST  2 VIEW COMPARISON:  PA and lateral chest 01/07/2016 and 12/25/2015. FINDINGS: Lung volumes are low with subsegmental atelectasis in the bases, greater on the right. No pneumothorax or pleural effusion. Heart size is normal. Aortic atherosclerosis is noted. IMPRESSION: Subsegmental bibasilar atelectasis in a low volume chest. Atherosclerosis. Electronically Signed   By: Inge Rise M.D.   On: 01/22/2016 11:31    EKG: (Independently reviewed) sinus rhythm with ventricular rate 83 bpm, QTC 458 ms, voltage  criteria met for LVH-no acute ischemic changes  Assessment/Plan Principal Problem:   Ascites -Marked increase in abdominal girth over 3-4 days in setting of known cirrhosis and apparent medication noncompliance and likely dietary noncompliance -Order placed for IR paracentesis with labs ordered as well -Discussed with Jolly Mango PA-C: Instructed to leave message to Dr. Ardis Hughs through [place] regarding need to schedule serial outpatient therapeutic paracentesis-currently no acute inpatient GI needs identified at time of admission -NPO until evaluated by interventional radiology physician -Lasix 60 mg IV every 12 hours; hold preadmission Aldactone -No constitutional symptoms or leukocytosis or abdominal pain that would be concerning for SBP  Active Problems:   Hepatic cirrhosis/Thrombocytopenia  -Also has history of portal hypertension with esophageal varices currently without any reported symptoms of bleeding -Continue Inderal -  Platelets greater than 100,000 -Coags pending -Etiology to cirrhosis reported to be cryptogenic     Diabetes mellitus, type 2  -Hold Amaryl in favor of SSI -Hemoglobin A1c 9/5 was 5.8-consider discontinuation of Amaryl in patient with underlying cirrhosis and tightly controlled diabetes    Acute kidney injury  -Baseline renal function: 15/0.96 -Current renal function: 29/1.36 -Suspect shunting of blood volume into third spaces as evidenced by worsening ascites and bilateral lower sternum at the edema thus decreasing renal perfusion and causing mild acute kidney injury -Also blood pressure not well controlled -Diuresis as above as well as therapeutic paracentesis -Check magnesium and phosphorus    Hyponatremia -Secondary to volume overload and cirrhosis    Left hip pain -Needs XR after paracentesis    Anemia of chronic disease -Baseline hemoglobin is 10 -Current hemoglobin 12.5 -Follow CBC -Continue iron replacement; reports issues with intermittent  constipation so we'll continue prn Dulcolax and begin twice a day Colace    Nonadherence to medical treatment -Sister verbalizes concerns that patient is not taking his medication as prescribed -Discussed with patient multiple ways to follow: Purchase weekly med minder that allows him to see medications taken each day; also recommended writing down medications when he takes them -Patient lives alone and wife has been a nursing facility for 4 years; he continues to drive but appears to have difficulty preparing nutritious appropriate meals although he does take at least one meal/day today at the nursing facility -PT/OT evaluation -Uncertain if would benefit from short-term are in visit after discharge to make sure he has an easy to adhere to medication system as mentioned above      DVT prophylaxis: Lovenox Code Status: Full Family Communication: Sister at bedside with patient's permission Disposition Plan: Anticipate discharge back to preadmission home environment once medically stable but this is pending PT/OT evaluation Consults called: None    Jeffrie Lofstrom L. ANP-BC [facility] Pager [phone]   If 7PM-7AM, please contact night-coverage [email or web address] Password [email or web address]  01/22/2016, 2:17 PM

## 2016-01-22 NOTE — ED Notes (Signed)
Called lab to add on Magnesium and Phosphorus.

## 2016-01-22 NOTE — ED Notes (Signed)
Checked patient Justin Scott it was [id number] notified RN Carla of [facility]

## 2016-01-22 NOTE — ED Triage Notes (Addendum)
Patient states CHF and is having shortness of breath.  Patient denies chest pain at this time.   Patient states his abdomen is full of fluid.   Patient states he had some fluid drawn off last week.

## 2016-01-22 NOTE — Progress Notes (Signed)
Patient arrived on unit via stretcher from ED.  No family at bedside.  

## 2016-01-22 NOTE — ED Provider Notes (Signed)
[facility]-EMERGENCY DEPT Provider Note   CSN: [id number] Arrival date & time: 01/22/16  1033     History   Chief Complaint Chief Complaint  Patient presents with  . Shortness of Breath    HPI Justin Scott is a 65 y.o. male.  HPI Justin Scott is a 65 y.o. male with hx of anemia, liver cirrhosis, CHF, DM, presents to ED with complaint of SOB and swelling. Pt recently hospitalized for the same, last admission on 12/25/15. Pt was diuresed and had an echo which showed stable CHF, had EGD which showed chronic varices and erosive gastritis. Pt states he has noticed increased SOB and swelling in the last 3 days. He was seen by PCP. He is unable to get in with Dr. Christella Hartigan for another few weeks. Yesterday, pt's lasix and aldactone was increased, however because of worsening SOB he was instructed to come to ED. Pt denies chest pain. Denies Fever, chills. Denies abdominal pain. Normal urination and bowel movements.   Past Medical History:  Diagnosis Date  . Anemia 02/2013   transfused PRBC x 3.  bled in setting of cirrhosis, esophageal varices, portal htn.   . Ascites   . Cirrhosis (HCC)   . Congestive heart failure (CHF) (HCC)   . Diabetes mellitus without complication [facility])     Patient Active Problem List   Diagnosis Date Noted  . Gastric erosions   . Cirrhosis (HCC)   . Fluid overload 12/25/2015  . Prolonged Q-T interval on ECG 12/25/2015  . Absolute anemia   . Ascites   . Cryptogenic cirrhosis (HCC)   . Portal hypertension (HCC)   . Abnormal CT of liver   . Anemia of chronic disease   . Hepatic cirrhosis (HCC) 03/10/2013  . Acute upper GI bleed 03/10/2013  . Diabetes mellitus, type 2 (HCC) 03/10/2013  . Esophageal varices with bleeding (HCC) 03/10/2013  . Acute blood loss anemia 03/10/2013  . Transaminitis 03/10/2013  . Thrombocytopenia (HCC) 03/10/2013  . Lactic acidosis 03/10/2013    Past Surgical History:  Procedure Laterality Date  . ESOPHAGOGASTRODUODENOSCOPY N/A  03/10/2013   3 trunks of small to medium sized distal esophagus varices. None had clear signs of recent bleeding however there was hematin in stomach was well as a small amount of fresh red blood at GE junction. There was moderate portal gastropathy throughout the stomach. Dr Christella Hartigan placed 7 variceal ligating bands (one misfired) without immediate complication.  . ESOPHAGOGASTRODUODENOSCOPY N/A 12/27/2015   Procedure: ESOPHAGOGASTRODUODENOSCOPY (EGD);  Surgeon: Justin Fredrickson, MD;  Location[facility] ENDOSCOPY;  Service: Endoscopy;  Laterality: N/A;       Home Medications    Prior to Admission medications   Medication Sig Start Date End Date Taking? Authorizing Provider  bisacodyl (DULCOLAX) 5 MG EC tablet Take 1 tablet (5 mg total) by mouth daily as needed for moderate constipation. 12/28/15   Leroy Sea, MD  ferrous sulfate 325 (65 FE) MG tablet Take 1 tablet (325 mg total) by mouth 2 (two) times daily with a meal. 12/28/15   Leroy Sea, MD  furosemide (LASIX) 40 MG tablet Take 1 tablet (40 mg total) by mouth daily. 12/28/15   Leroy Sea, MD  glimepiride (AMARYL) 4 MG tablet Take 4 mg by mouth daily with breakfast.    Historical Provider, MD  pantoprazole (PROTONIX) 40 MG tablet Take 1 tablet (40 mg total) by mouth daily. 12/28/15   Leroy Sea, MD  propranolol (INDERAL) 10 MG tablet Take 1  tablet (10 mg total) by mouth 3 (three) times daily. 12/28/15   Leroy Sea, MD  spironolactone (ALDACTONE) 100 MG tablet Take 1 tablet (100 mg total) by mouth daily. 12/28/15   Leroy Sea, MD    Family History Family History  Problem Relation Age of Onset  . Diabetes Mother   . Heart disease Father   . Diabetes Father     Social History Social History  Substance Use Topics  . Smoking status: Former Games developer  . Smokeless tobacco: Never Used  . Alcohol use No     Allergies   Review of patient's allergies indicates no known allergies.   Review of Systems Review of Systems    Constitutional: Negative for chills and fever.  Respiratory: Positive for chest tightness and shortness of breath. Negative for cough.   Cardiovascular: Positive for leg swelling. Negative for chest pain and palpitations.  Gastrointestinal: Positive for abdominal distention. Negative for abdominal pain, diarrhea, nausea and vomiting.  Genitourinary: Negative for dysuria, frequency, hematuria and urgency.  Musculoskeletal: Negative for arthralgias, myalgias, neck pain and neck stiffness.  Skin: Negative for rash.  Allergic/Immunologic: Negative for immunocompromised state.  Neurological: Negative for dizziness, weakness, light-headedness, numbness and headaches.  All other systems reviewed and are negative.    Physical Exam Updated Vital Signs BP 133/92 (BP Location: Right Arm)   Pulse 84   Resp 22   SpO2 100%   Physical Exam  Constitutional: He appears well-developed and well-nourished. No distress.  HENT:  Head: Normocephalic and atraumatic.  Eyes: Conjunctivae are normal.  Neck: Neck supple.  Cardiovascular: Normal rate, regular rhythm and normal heart sounds.   Pulmonary/Chest: He has no wheezes. He has no rales.  Tachypnea, decreased air movement bilaterally  Abdominal: Soft. Bowel sounds are normal. He exhibits distension. There is no tenderness. There is no rebound.  Abdominal distention, ascites. Nontender.  Musculoskeletal: He exhibits edema.  3+ bilateral LE edema  Neurological: He is alert.  Skin: Skin is warm and dry.  Nursing note and vitals reviewed.    ED Treatments / Results  Labs (all labs ordered are listed, but only abnormal results are displayed) Labs Reviewed  CBC WITH DIFFERENTIAL/PLATELET  COMPREHENSIVE METABOLIC PANEL  BRAIN NATRIURETIC PEPTIDE  I-STAT TROPOININ, ED    EKG  EKG Interpretation  Date/Time:  Tuesday January 22 2016 10:37:01 EDT Ventricular Rate:  83 PR Interval:  122 QRS Duration: 92 QT Interval:  390 QTC  Calculation: 458 R Axis:   -11 Text Interpretation:  Normal sinus rhythm Left ventricular hypertrophy Possible Lateral infarct , age undetermined Abnormal ECG No significant change since last tracing Confirmed by LITTLE MD, RACHEL ([id number]) on 01/22/2016 10:47:36 AM Also confirmed by LITTLE MD, RACHEL ([id number]), editor [place]  CCT, BEVERLY ([id number])  on 01/22/2016 11:01:15 AM       Radiology No results found.  Procedures Procedures (including critical care time)  Medications Ordered in ED Medications - No data to display   Initial Impression / Assessment and Plan / ED Course  I have reviewed the triage vital signs and the nursing notes.  Pertinent labs & imaging results that were available during my care of the patient were reviewed by me and considered in my medical decision making (see chart for details).  Clinical Course    Patient in emergency department with fluid overload, shortness of breath that has worsened in the last 3 days, most likely due to his liver cirrhosis. Recent admission for the same. Patient is in no  acute distress at this time. will check labs, chest x-ray, monitor. Lasix ordered.   Labs showing worsening creatinine, otherwise with no significant abnormality off baseline. Plan to admit for diuresis and paracentesis. Spoke with triad, will admit. Pt in NAD. No respiratory distress.   Vitals:   01/22/16 1200 01/22/16 1250 01/22/16 1251 01/22/16 1300  BP:  143/89  134/95  Pulse:  80  79  Resp:  18  22  Temp:  97.8 F (36.6 C)    TempSrc:  Oral    SpO2:  98%  98%  Weight: 98.2 kg  98.2 kg      Final Clinical Impressions(s) / ED Diagnoses   Final diagnoses:  Other ascites  SOB (shortness of breath)  Cirrhosis of liver with ascites, unspecified hepatic cirrhosis type [facility](HCC)    New Prescriptions New Prescriptions   No medications on file     Jaynie Crumbleatyana Jonella Redditt, PA-C 01/23/16 1600    Laurence Spatesachel Morgan Little, MD 01/26/16 1725

## 2016-01-22 NOTE — ED Notes (Signed)
Got patient clothes off and a weight and placed on the monitor

## 2016-01-22 NOTE — Telephone Encounter (Signed)
See alternate note  

## 2016-01-22 NOTE — Telephone Encounter (Signed)
I spoke to the pt's sister Steward Drone(Brenda [phone]) and she will have her husband go and pick the pt up and [facility].  I called the pt and advised him that the brother n law would be coming to take him to the ED he agreed.

## 2016-01-22 NOTE — ED Notes (Signed)
Report to [employer]becca RN on 6E.

## 2016-01-23 DIAGNOSIS — Z9119 Patient's noncompliance with other medical treatment and regimen: Secondary | ICD-10-CM

## 2016-01-23 DIAGNOSIS — N179 Acute kidney failure, unspecified: Secondary | ICD-10-CM | POA: Diagnosis not present

## 2016-01-23 DIAGNOSIS — R188 Other ascites: Secondary | ICD-10-CM | POA: Diagnosis not present

## 2016-01-23 DIAGNOSIS — M25552 Pain in left hip: Secondary | ICD-10-CM

## 2016-01-23 DIAGNOSIS — K7460 Unspecified cirrhosis of liver: Secondary | ICD-10-CM | POA: Diagnosis not present

## 2016-01-23 DIAGNOSIS — D638 Anemia in other chronic diseases classified elsewhere: Secondary | ICD-10-CM | POA: Diagnosis not present

## 2016-01-23 DIAGNOSIS — E119 Type 2 diabetes mellitus without complications: Secondary | ICD-10-CM

## 2016-01-23 LAB — COMPREHENSIVE METABOLIC PANEL
ALBUMIN: 2.1 g/dL — AB (ref 3.5–5.0)
ALT: 12 U/L — ABNORMAL LOW (ref 17–63)
ANION GAP: 7 (ref 5–15)
AST: 27 U/L (ref 15–41)
Alkaline Phosphatase: 75 U/L (ref 38–126)
BILIRUBIN TOTAL: 1.3 mg/dL — AB (ref 0.3–1.2)
BUN: 30 mg/dL — ABNORMAL HIGH (ref 6–20)
CALCIUM: 8.4 mg/dL — AB (ref 8.9–10.3)
CO2: 25 mmol/L (ref 22–32)
Chloride: 101 mmol/L (ref 101–111)
Creatinine, Ser: 1.49 mg/dL — ABNORMAL HIGH (ref 0.61–1.24)
GFR, EST AFRICAN AMERICAN: 56 mL/min — AB (ref >60)
GFR, EST NON AFRICAN AMERICAN: 49 mL/min — AB (ref >60)
GLUCOSE: 91 mg/dL (ref 65–99)
POTASSIUM: 4 mmol/L (ref 3.5–5.1)
Sodium: 133 mmol/L — ABNORMAL LOW (ref 135–145)
TOTAL PROTEIN: 6.7 g/dL (ref 6.5–8.1)

## 2016-01-23 LAB — PROTIME-INR
INR: 1.72
PROTHROMBIN TIME: 20.4 s — AB (ref 11.4–15.2)

## 2016-01-23 LAB — CBC
HEMATOCRIT: 37.2 % — AB (ref 39.0–52.0)
HEMOGLOBIN: 11.7 g/dL — AB (ref 13.0–17.0)
MCH: 25.4 pg — ABNORMAL LOW (ref 26.0–34.0)
MCHC: 31.5 g/dL (ref 30.0–36.0)
MCV: 80.9 fL (ref 78.0–100.0)
Platelets: 119 10*3/uL — ABNORMAL LOW (ref 150–400)
RBC: 4.6 MIL/uL (ref 4.22–5.81)
RDW: 28.7 % — ABNORMAL HIGH (ref 11.5–15.5)
WBC: 8.2 10*3/uL (ref 4.0–10.5)

## 2016-01-23 LAB — PH, BODY FLUID: PH, BODY FLUID: 7.8

## 2016-01-23 LAB — GLUCOSE, CAPILLARY
GLUCOSE-CAPILLARY: 55 mg/dL — AB (ref 65–99)
GLUCOSE-CAPILLARY: 85 mg/dL (ref 65–99)
GLUCOSE-CAPILLARY: 144 mg/dL — AB (ref 65–99)
GLUCOSE-CAPILLARY: 18 mg/dL — AB (ref 65–99)
Glucose-Capillary: 70 mg/dL (ref 65–99)

## 2016-01-23 LAB — AMMONIA: Ammonia: 127 umol/L — ABNORMAL HIGH (ref 9–35)

## 2016-01-23 MED ORDER — SPIRONOLACTONE 50 MG PO TABS
100.0000 mg | ORAL_TABLET | Freq: Every day | ORAL | Status: DC
  Administered 2016-01-23: 100 mg via ORAL
  Filled 2016-01-23: qty 2

## 2016-01-23 MED ORDER — FUROSEMIDE 40 MG PO TABS: 40.0000 mg | ORAL_TABLET | Freq: Two times a day (BID) | ORAL | 0 refills | Status: AC

## 2016-01-23 MED ORDER — SPIRONOLACTONE 100 MG PO TABS: 100.0000 mg | ORAL_TABLET | Freq: Two times a day (BID) | ORAL | 0 refills | Status: AC

## 2016-01-23 NOTE — Progress Notes (Signed)
Initial Nutrition Assessment  DOCUMENTATION CODES:   Obesity unspecified  INTERVENTION:  Recommend nutritional supplements post discharge especially if po intake becomes poor.   NUTRITION DIAGNOSIS:   Increased nutrient needs related to chronic illness as evidenced by estimated needs.  GOAL:   Patient will meet greater than or equal to 90% of their needs  MONITOR:   PO intake, Labs, Weight trends, Skin, I & O's  REASON FOR ASSESSMENT:   Consult Assessment of nutrition requirement/status  ASSESSMENT:   65 y.o. male with a Past Medical History of anemia, cryptogenic cirrhosis, CHF, DM who presents with ascites from progressive Cirrhosis.   Pt reports having a good appetite currently and PTA with usual consumption of at least 3 meals a day. Meal completion 100% this AM. Pt reports he usually follows a low sodium diet and has home cooked meals on a daily basis and eats out only on occasion. No family at bedside. Usual body weight reported to be ~216 lbs. Pt reports plans for discharge today. Recommended pt to consume nutritional supplements at home if po intake becomes poor. Pt agreeable.   Pt with no observed significant fat or muscle mass loss.   Labs and medications reviewed.   Diet Order:  Diet 2 gram sodium Room service appropriate? Yes; Fluid consistency: Thin Diet - low sodium heart healthy  Skin:  Reviewed, no issues  Last BM:  10/2  Height:   Ht Readings from Last 1 Encounters:  01/22/16 5\' 7"  (1.702 m)    Weight:   Wt Readings from Last 1 Encounters:  01/22/16 203 lb (92.1 kg)    Ideal Body Weight:  67.27 kg  BMI:  Body mass index is 31.79 kg/m.  Estimated Nutritional Needs:   Kcal:  2000-2200  Protein:  110-130 grams  Fluid:  Per MD  EDUCATION NEEDS:   Education needs addressed  Roslyn SmilingStephanie Shiori Adcox, MS, RD, LDN Pager # [phone] After hours/ weekend pager # [phone]

## 2016-01-23 NOTE — Care Management Note (Signed)
Case Management Note  Patient Details  Name: Justin Scott MRN: [id number] Date of Birth: 04-30-1950  Subjective/Objective:     Ascites               Action/Plan: Discharge Planning: AVS reviewed:   [facility] Spoke to Liaison and PCP prefers to see pt before signing HH ordrs.  Contacted PCP's office, arranged appt for 01/29/2016 at  10 am. Pt prefers am appts. NCM explained to pt and states he does not feel [facility] will be able [facility] Has [facility]W at home. Will fax dc summary to PCP's office once available.   Expected Discharge Date:  01/23/2016              Expected Discharge Plan:  Home w Home Health Services  In-House Referral:  NA  Discharge planning Services  CM Consult  Post Acute Care Choice:  Home Health Choice offered to:  Patient  DME Arranged:  N/A DME Agency:  NA  HH Arranged:  PT, RN HH Agency:  Other - See comment  Status of Service:  Completed, signed off  If discussed at Long Length of Stay Meetings, dates discussed:    Additional Comments:  Elliot CousinShavis, Morning Halberg Ellen, RN 01/23/2016, 12:42 PM

## 2016-01-23 NOTE — Discharge Summary (Signed)
Physician Discharge Summary  Justin Scott WJX:[id number] DOB: 1951-03-02 DOA: 01/22/2016  PCP: Kaleen Mask, MD  Admit date: 01/22/2016 Discharge date: 01/23/2016  Admitted From: home Disposition:  home   Recommendations for Outpatient Follow-up:  1. Bmet in 1 wk- f/u appt with Dr Shelah Lewandowsky also made  Home Health:  yes  Equipment/Devices:  none    Discharge Condition:  stable   CODE STATUS:  DNR   Diet recommendation:  Low sodium Consultations:      Discharge Diagnoses:  Principal Problem:   Ascites Active Problems:   Hepatic cirrhosis (HCC)   Diabetes mellitus, type 2 (HCC)   Thrombocytopenia (HCC)   Anemia of chronic disease   Nonadherence to medical treatment   Acute kidney injury (HCC)   Hyponatremia   Left hip pain   Cirrhosis of liver with ascites (HCC)    Subjective: Abdomen feels much better after paracentesis completed. No new complaints.   Brief Summary: Justin Scott is a 65 y.o. male with medical history significant for cryptogenic cirrhosis with associated esophageal varices and prior bleeding incidents, chronic, cytopenias secondary to cirrhosis, recurrent ascites with lower extremity edema, anemia of chronic disease, chronic hyponatremia, diabetes on oral agent and history of prior prolonged QT on ECG. Patient was recently discharged on 9/8 after an admission for symptomatically anemia requiring transfusion. Endoscopy previous admission unrevealing. Patient returns to the ER complaining of shortness of breath. He is a poor historian. He has not had any chest pain. He has mild dyspnea on exertion. He does not weigh himself regularly. According to his sister is at the bedside he apparently does not take his medications as prescribed either. Over the past 3 days he has had increasing abdominal girth and increasing lower extremity edema. He notified his primary care physician on 10/2 instructed him to increase his Lasix to 40 mg twice a day and continue  same Aldactone dose. He has not seen any improvement in his symptoms are presented to the ER for further evaluation.   Hospital Course:  Ascites - According to history of present illness, his sister feels that he does not take his medications appropriately -In speaking with the patient, he tells me he takes his medications as prescribed - he cannot name the medications but states he takes about 7 pills once a day- per notes in our computer system, Lasix and Aldactone were recently doubled- Lasix increased to 40 mg BID and Aldactone to 200 mg daily - he does not recall this - He has undergone a paracentesis on 10/3 with removal of 4 L of fluid - at this point will recommend Lasix 40 mg BID with Aldactone 100 mg BID and close follow up as outpt- I have requested a HHRN to follow him to help determine if he is taking medications as ordered and also to draw a Bmet in 1 wk to follow electrolytes  - if he has truly failed medical management, he will need serial paracentesis at outpt-   Cryptogenic Hepatic cirrhosis/Thrombocytopenia  -Also has history of portal hypertension with esophageal varices currently without any reported symptoms of bleeding -Continue Inderal -Platelets greater than 100,000 -Etiology to cirrhosis reported to be cryptogenic   Left hip pain - no complaint of this today- will go home with HHPT     Diabetes mellitus, type 2  -cont Amaryl   -Hemoglobin A1c 9/5 was 5.8     Acute kidney injury  -Baseline renal function: 15/0.96 -Current renal function: 29/1.49 - likely due to diuretics  and third spacing of fluid     Hyponatremia -chronic  - possibly due to diuretics   Anemia of chronic disease -Baseline hemoglobin is 10 -Current hemoglobin 11    Nonadherence to medical treatment -Sister verbalizes concerns that patient is not taking his medication as prescribed -Discussed with patient in detail- will have HHRN check on patient at home to monitor for and assist in  compliance     Discharge Instructions  Discharge Instructions    Diet - low sodium heart healthy    Complete by:  As directed    Increase activity slowly    Complete by:  As directed        Medication List    TAKE these medications   acetaminophen 325 MG tablet Commonly known as:  TYLENOL Take 650 mg by mouth every 6 (six) hours as needed for moderate pain (in hip).   bisacodyl 5 MG EC tablet Commonly known as:  DULCOLAX Take 1 tablet (5 mg total) by mouth daily as needed for moderate constipation.   ferrous sulfate 325 (65 FE) MG tablet Take 1 tablet (325 mg total) by mouth 2 (two) times daily with a meal.   furosemide 40 MG tablet Commonly known as:  LASIX Take 1 tablet (40 mg total) by mouth 2 (two) times daily. What changed:  when to take this   glimepiride 4 MG tablet Commonly known as:  AMARYL Take 4 mg by mouth daily with breakfast.   pantoprazole 40 MG tablet Commonly known as:  PROTONIX Take 1 tablet (40 mg total) by mouth daily.   propranolol 10 MG tablet Commonly known as:  INDERAL Take 1 tablet (10 mg total) by mouth 3 (three) times daily.   spironolactone 100 MG tablet Commonly known as:  ALDACTONE Take 1 tablet (100 mg total) by mouth 2 (two) times daily. What changed:  when to take this      Follow-up Information    Kaleen Mask, MD .   Specialty:  Family Medicine Why:  January 29, 2016 (Tuesday) at 10 am  Contact information: [address] [place] [address] [phone]          No Known Allergies   Procedures/Studies: Therapeutic paracentesis 10/3  Dg Chest 2 View  Result Date: 01/22/2016 CLINICAL DATA:  Shortness of breath for 3 days with abdominal swelling. EXAM: CHEST  2 VIEW COMPARISON:  PA and lateral chest 01/07/2016 and 12/25/2015. FINDINGS: Lung volumes are low with subsegmental atelectasis in the bases, greater on the right. No pneumothorax or pleural effusion. Heart size is normal. Aortic  atherosclerosis is noted. IMPRESSION: Subsegmental bibasilar atelectasis in a low volume chest. Atherosclerosis. Electronically Signed   By: Drusilla Kanner M.D.   On: 01/22/2016 11:31   Dg Chest 2 View  Result Date: 01/07/2016 CLINICAL DATA:  Continued SOB x several weeks, CHF EXAM: CHEST  2 VIEW COMPARISON:  12/25/2015 FINDINGS: Moderate thoracic spondylosis. Midline trachea. Mild cardiomegaly. Tortuous thoracic aorta. No pleural effusion or pneumothorax. Low lung volumes with resultant pulmonary interstitial prominence. No congestive failure. IMPRESSION: Cardiomegaly and low lung volumes.  No acute findings. Electronically Signed   By: Jeronimo Greaves M.D.   On: 01/07/2016 10:55   Dg Chest 2 View  Result Date: 12/25/2015 CLINICAL DATA:  Cryptogenic cirrhosis, ascites, now with increased abdominal swelling, peripheral edema, wheezing, and shortness of breath for the past 3 days. EXAM: CHEST  2 VIEW COMPARISON:  Portable chest x-ray of March 09, 2013 FINDINGS: The lungs are mildly  hypoinflated. The interstitial markings are increased. The cardiac silhouette is enlarged. The central pulmonary vascularity is prominent. There are small pleural effusions blunting the costophrenic angles bilaterally. There is calcification in the wall of the aortic arch. There is mild multilevel degenerative disc disease of the thoracic spine. IMPRESSION: Mild CHF.  Small bilateral pleural effusions. Aortic atherosclerosis Electronically Signed   By: David  [place] M.D.   On: 12/25/2015 10:10   Ct Abdomen Pelvis W Contrast  Result Date: 12/26/2015 CLINICAL DATA:  Anemia.  Cirrhosis and abdominal distention. EXAM: CT ABDOMEN AND PELVIS WITH CONTRAST TECHNIQUE: Multidetector CT imaging of the abdomen and pelvis was performed using the standard protocol following bolus administration of intravenous contrast. CONTRAST:  ISOVUE-300 IOPAMIDOL (ISOVUE-300) INJECTION 61% COMPARISON:  03/09/2013 FINDINGS: Lower chest: There are  small bilateral pleural effusions identified. Hepatobiliary: There is diffuse atrophy of the liver which has a nodular contour or compatible with advanced cirrhosis. No suspicious focal liver abnormality identified. Mild gallbladder wall edema. No biliary dilatation. Pancreas: No inflammation or mass identified. Spleen: The spleen is enlarged with a length of 17 cm. No focal splenic abnormality. Adrenals/Urinary Tract: Normal appearance of the adrenal glands. The kidneys both appear normal. Normal appearance of the urinary bladder. Stomach/Bowel: The stomach is normal. There is no pathologic dilatation of the large or small bowel loops. Vascular/Lymphatic: Calcified atherosclerotic disease involves the abdominal aorta. No aneurysm. Periaortic lymph node measures 11 mm, image number 18 of series 2. Portacaval lymph node is enlarged measuring 1.4 cm. Multiple prominent mesenteric lymph nodes are also identified. There is re- cannulization of the umbilical vein. 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 Reproductive: No mass identified. Other: Moderate to marked amount of abdominal ascites is identified. Musculoskeletal: No aggressive lytic or sclerotic bone lesions identified. IMPRESSION: 1. Advanced changes of cirrhosis. 2. Stigmata of portal venous hypertension including ascites, splenomegaly and varicosities. 3. Aortic atherosclerosis 4. Borderline enlarged upper abdominal lymph nodes may be nonspecific in the setting of cirrhosis. Electronically Signed   By: Signa Kell M.D.   On: 12/26/2015 13:56   US Paracentesis  Result Date: 01/22/2016 INDICATION: Recurrent ascites EXAM: ULTRASOUND-GUIDED PARACENTESIS COMPARISON:  Previous paracentesis. MEDICATIONS: 10 cc 1% lidocaine COMPLICATIONS: None immediate. TECHNIQUE: Informed written consent was obtained from the patient after a discussion of the risks, benefits and alternatives to treatment. A timeout was performed prior to the initiation of the  procedure. Initial ultrasound scanning demonstrates a large amount of ascites within the right lower abdominal quadrant. The right lower abdomen was prepped and draped in the usual sterile fashion. 1% lidocaine with epinephrine was used for local anesthesia. Under direct ultrasound guidance, a 19 gauge, 7-cm, Yueh catheter was introduced. An ultrasound image was saved for documentation purposed. The paracentesis was performed. The catheter was removed and a dressing was applied. The patient tolerated the procedure well without immediate post procedural complication. FINDINGS: A total of approximately 4 liters of milky fluid was removed. Samples were sent to the laboratory as requested by the clinical team. IMPRESSION: Successful ultrasound-guided paracentesis yielding 4 liters of peritoneal fluid. Maximum per MD. Read by: Robet Leu [facility] Electronically Signed   By: Gilmer Mor D.O.   On: 01/22/2016 16:40   US Paracentesis  Result Date: 01/03/2016 INDICATION: Recurrent ascites EXAM: ULTRASOUND-GUIDED PARACENTESIS COMPARISON:  Previous paracentesis MEDICATIONS: 10 cc 1% lidocaine COMPLICATIONS: None immediate. TECHNIQUE: Informed written consent was obtained from the patient after a discussion of the risks, benefits and alternatives to treatment. A timeout was performed  prior to the initiation of the procedure. Initial ultrasound scanning demonstrates a large amount of ascites within the right lower abdominal quadrant. The right lower abdomen was prepped and draped in the usual sterile fashion. 1% lidocaine with epinephrine was used for local anesthesia. Under direct ultrasound guidance, a 19 gauge, 7-cm, Yueh catheter was introduced. An ultrasound image was saved for documentation purposed. The paracentesis was performed. The catheter was removed and a dressing was applied. The patient tolerated the procedure well without immediate post procedural complication. FINDINGS: A total of approximately 3.9 liters  of creamy yellow fluid was removed. Samples were sent to the laboratory as requested by the clinical team. IMPRESSION: Successful ultrasound-guided paracentesis yielding 3.9 liters of peritoneal fluid. Read by Robet LeuPamela A [facility] Electronically Signed   By: Simonne ComeJohn  Watts M.D.   On: 01/03/2016 15:39   [place] Paracentesis  Result Date: 12/26/2015 INDICATION: History of cirrhosis, now with symptomatic ascites. Please perform ultrasound-guided paracentesis for therapeutic and diagnostic purposes. EXAM: ULTRASOUND-GUIDED PARACENTESIS COMPARISON:  None. MEDICATIONS: 10 cc 1% lidocaine COMPLICATIONS: None immediate. TECHNIQUE: Informed written consent was obtained from the patient after a discussion of the risks, benefits and alternatives to treatment. A timeout was performed prior to the initiation of the procedure. Initial ultrasound scanning demonstrates a large amount of ascites within the right lower abdominal quadrant. The right lower abdomen was prepped and draped in the usual sterile fashion. 1% lidocaine with epinephrine was used for local anesthesia. Under direct ultrasound guidance, a 19 gauge, 7-cm, Yueh catheter was introduced. An ultrasound image was saved for documentation purposed. The paracentesis was performed. The catheter was removed and a dressing was applied. The patient tolerated the procedure well without immediate post procedural complication. FINDINGS: A total of approximately 1.1 liters of creamy colored fluid was removed. Samples were sent to the laboratory as requested by the clinical team. IMPRESSION: Successful ultrasound-guided paracentesis yielding 1.1 liters of peritoneal fluid. Read by: Robet LeuPamela A [facility] Electronically Signed   By: Simonne ComeJohn  Watts M.D.   On: 12/25/2015 16:11       Discharge Exam: Vitals:   01/23/16 0434 01/23/16 0937  BP: 107/62 120/71  Pulse: 81 87  Resp: 17 18  Temp: 98.4 F (36.9 C) 98.1 F (36.7 C)   Vitals:   01/22/16 1640 01/22/16 2041 01/23/16 0434  01/23/16 0937  BP: 121/85 121/71 107/62 120/71  Pulse: 78 87 81 87  Resp: 19 16 17 18   Temp: 97.8 F (36.6 C) 98.4 F (36.9 C) 98.4 F (36.9 C) 98.1 F (36.7 C)  TempSrc: Oral Oral Oral Oral  SpO2: 100% 99% 99% 98%  Weight:  92.1 kg (203 lb)    Height: 5\' 7"  (1.702 m)       General: Pt is alert, awake, not in acute distress Cardiovascular: RRR, S1/S2 +, no rubs, no gallops Respiratory: CTA bilaterally, no wheezing, no rhonchi Abdominal: Soft, NT, ND, bowel sounds + Extremities: no edema, no cyanosis    The results of significant diagnostics from this hospitalization (including imaging, microbiology, ancillary and laboratory) are listed below for reference.     Microbiology: Recent Results (from the past 240 hour(s))  Gram stain     Status: None   Collection Time: 01/22/16  3:55 PM  Result Value Ref Range Status   Specimen Description FLUID PERITONEAL ABDOMEN  Final   Special Requests NONE  Final   Gram Stain   Final    MODERATE WBC PRESENT,BOTH PMN AND MONONUCLEAR NO ORGANISMS SEEN  Report Status 01/22/2016 FINAL  Final     Labs: BNP (last 3 results)  Recent Labs  12/25/15 0931 01/22/16 1102  BNP 60.9 52.0   Basic Metabolic Panel:  Recent Labs Lab 01/22/16 1138 01/23/16 0552  NA 132* 133*  K 4.6 4.0  CL 102 101  CO2 23 25  GLUCOSE 128* 91  BUN 29* 30*  CREATININE 1.36* 1.49*  CALCIUM 8.9 8.4*  MG 1.9  --   PHOS 4.0  --    Liver Function Tests:  Recent Labs Lab 01/22/16 1138 01/23/16 0552  AST 32 27  ALT 13* 12*  ALKPHOS 73 75  BILITOT 1.3* 1.3*  PROT 7.2 6.7  ALBUMIN 2.4* 2.1*   No results for input(s): LIPASE, AMYLASE in the last 168 hours.  Recent Labs Lab 01/23/16 0552  AMMONIA 127*   CBC:  Recent Labs Lab 01/22/16 1138 01/23/16 0552  WBC 7.9 8.2  NEUTROABS 5.8  --   HGB 12.5* 11.7*  HCT 40.3 37.2*  MCV 81.1 80.9  PLT 121* 119*   Cardiac Enzymes: No results for input(s): CKTOTAL, CKMB, CKMBINDEX, TROPONINI in the  last 168 hours. BNP: Invalid input(s): POCBNP CBG:  Recent Labs Lab 01/23/16 0424 01/23/16 0516 01/23/16 0747 01/23/16 0748 01/23/16 1216  GLUCAP 55* 85 18* 144* 70   D-Dimer No results for input(s): DDIMER in the last 72 hours. Hgb A1c No results for input(s): HGBA1C in the last 72 hours. Lipid Profile No results for input(s): CHOL, HDL, LDLCALC, TRIG, CHOLHDL, LDLDIRECT in the last 72 hours. Thyroid function studies No results for input(s): TSH, T4TOTAL, T3FREE, THYROIDAB in the last 72 hours.  Invalid input(s): FREET3 Anemia work up No results for input(s): VITAMINB12, FOLATE, FERRITIN, TIBC, IRON, RETICCTPCT in the last 72 hours. Urinalysis    Component Value Date/Time   COLORURINE YELLOW 12/25/2015 1140   APPEARANCEUR CLEAR 12/25/2015 1140   LABSPEC 1.016 12/25/2015 1140   PHURINE 5.5 12/25/2015 1140   GLUCOSEU NEGATIVE 12/25/2015 1140   HGBUR NEGATIVE 12/25/2015 1140   BILIRUBINUR NEGATIVE 12/25/2015 1140   KETONESUR NEGATIVE 12/25/2015 1140   PROTEINUR NEGATIVE 12/25/2015 1140   UROBILINOGEN 0.2 03/09/2013 2318   NITRITE NEGATIVE 12/25/2015 1140   LEUKOCYTESUR NEGATIVE 12/25/2015 1140   Sepsis Labs Invalid input(s): PROCALCITONIN,  WBC,  LACTICIDVEN Microbiology Recent Results (from the past 240 hour(s))  Gram stain     Status: None   Collection Time: 01/22/16  3:55 PM  Result Value Ref Range Status   Specimen Description FLUID PERITONEAL ABDOMEN  Final   Special Requests NONE  Final   Gram Stain   Final    MODERATE WBC PRESENT,BOTH PMN AND MONONUCLEAR NO ORGANISMS SEEN    Report Status 01/22/2016 FINAL  Final     Time coordinating discharge: Over 30 minutes  SIGNED:   Calvert Cantor, MD  [facility] 01/23/2016, 12:43 PM Pager   If 7PM-7AM, please contact night-coverage [email or web address] Password [email or web address]

## 2016-01-27 LAB — CULTURE, BODY FLUID-BOTTLE: CULTURE: NO GROWTH

## 2016-01-28 ENCOUNTER — Telehealth: Payer: Self-pay | Admitting: Gastroenterology

## 2016-01-28 NOTE — Telephone Encounter (Signed)
Pt appt has been changed to 01/30/16, sister is aware and will be with the pt at his office visit

## 2016-01-29 ENCOUNTER — Ambulatory Visit (HOSPITAL_COMMUNITY): Payer: BC Managed Care – PPO

## 2016-01-29 ENCOUNTER — Encounter (HOSPITAL_COMMUNITY): Payer: Self-pay | Admitting: Emergency Medicine

## 2016-01-29 ENCOUNTER — Emergency Department (HOSPITAL_COMMUNITY)
Admission: EM | Admit: 2016-01-29 | Discharge: 2016-01-29 | Disposition: A | Discharge status: Home / Self Care | Payer: BC Managed Care – PPO | Source: Home / Self Care | Attending: Emergency Medicine | Admitting: Emergency Medicine

## 2016-01-29 ENCOUNTER — Emergency Department (HOSPITAL_COMMUNITY): Payer: BC Managed Care – PPO

## 2016-01-29 DIAGNOSIS — Z87891 Personal history of nicotine dependence: Secondary | ICD-10-CM

## 2016-01-29 DIAGNOSIS — R0602 Shortness of breath: Secondary | ICD-10-CM

## 2016-01-29 DIAGNOSIS — K7469 Other cirrhosis of liver: Secondary | ICD-10-CM

## 2016-01-29 DIAGNOSIS — R188 Other ascites: Secondary | ICD-10-CM | POA: Insufficient documentation

## 2016-01-29 DIAGNOSIS — G9340 Encephalopathy, unspecified: Secondary | ICD-10-CM | POA: Diagnosis not present

## 2016-01-29 DIAGNOSIS — Z7984 Long term (current) use of oral hypoglycemic drugs: Secondary | ICD-10-CM | POA: Insufficient documentation

## 2016-01-29 DIAGNOSIS — E119 Type 2 diabetes mellitus without complications: Secondary | ICD-10-CM

## 2016-01-29 DIAGNOSIS — I509 Heart failure, unspecified: Secondary | ICD-10-CM | POA: Insufficient documentation

## 2016-01-29 DIAGNOSIS — K7290 Hepatic failure, unspecified without coma: Secondary | ICD-10-CM | POA: Diagnosis not present

## 2016-01-29 LAB — CBC
HCT: 43.3 % (ref 39.0–52.0)
Hemoglobin: 13.5 g/dL (ref 13.0–17.0)
MCH: 25.7 pg — AB (ref 26.0–34.0)
MCHC: 31.2 g/dL (ref 30.0–36.0)
MCV: 82.3 fL (ref 78.0–100.0)
PLATELETS: 160 10*3/uL (ref 150–400)
RBC: 5.26 MIL/uL (ref 4.22–5.81)
WBC: 9.8 10*3/uL (ref 4.0–10.5)

## 2016-01-29 LAB — COMPREHENSIVE METABOLIC PANEL
ALK PHOS: 91 U/L (ref 38–126)
ALT: 16 U/L — ABNORMAL LOW (ref 17–63)
ANION GAP: 11 (ref 5–15)
AST: 31 U/L (ref 15–41)
Albumin: 2.5 g/dL — ABNORMAL LOW (ref 3.5–5.0)
BILIRUBIN TOTAL: 1.7 mg/dL — AB (ref 0.3–1.2)
BUN: 44 mg/dL — ABNORMAL HIGH (ref 6–20)
CHLORIDE: 99 mmol/L — AB (ref 101–111)
CO2: 22 mmol/L (ref 22–32)
Calcium: 9.1 mg/dL (ref 8.9–10.3)
Creatinine, Ser: 1.86 mg/dL — ABNORMAL HIGH (ref 0.61–1.24)
GFR calc non Af Amer: 37 mL/min — ABNORMAL LOW (ref >60)
GFR, EST AFRICAN AMERICAN: 43 mL/min — AB (ref >60)
GLUCOSE: 208 mg/dL — AB (ref 65–99)
POTASSIUM: 4.7 mmol/L (ref 3.5–5.1)
SODIUM: 132 mmol/L — AB (ref 135–145)
Total Protein: 8.2 g/dL — ABNORMAL HIGH (ref 6.5–8.1)

## 2016-01-29 LAB — BRAIN NATRIURETIC PEPTIDE: B Natriuretic Peptide: 28.3 pg/mL (ref 0.0–100.0)

## 2016-01-29 LAB — I-STAT TROPONIN, ED: TROPONIN I, POC: 0 ng/mL (ref 0.00–0.08)

## 2016-01-29 LAB — PROTIME-INR
INR: 1.69
PROTHROMBIN TIME: 20.1 s — AB (ref 11.4–15.2)

## 2016-01-29 MED ORDER — FUROSEMIDE 10 MG/ML IJ SOLN
40.0000 mg | Freq: Once | INTRAMUSCULAR | Status: AC
  Administered 2016-01-29: 40 mg via INTRAVENOUS
  Filled 2016-01-29: qty 4

## 2016-01-29 MED ORDER — LIDOCAINE HCL 1 % IJ SOLN
INTRAMUSCULAR | Status: AC
  Filled 2016-01-29: qty 20

## 2016-01-29 NOTE — ED Notes (Signed)
Pt used urinal  

## 2016-01-29 NOTE — ED Notes (Signed)
2 RNs attempted to gain IV access with no luck. Dr. Fredderick PhenixBelfi informed.

## 2016-01-29 NOTE — ED Notes (Signed)
IV team at bedside 

## 2016-01-29 NOTE — ED Notes (Signed)
Extra blood draw on pt and sent to lab

## 2016-01-29 NOTE — ED Triage Notes (Signed)
Pt sts increased SOB and noted large abd distention; pt with hx of same; pt sts started feeling poorly yesterday

## 2016-01-29 NOTE — ED Notes (Signed)
Patient Alert and oriented X4. Stable and ambulatory. Patient verbalized understanding of the discharge instructions.  Patient belongings were taken by the patient.  

## 2016-01-29 NOTE — Discharge Instructions (Signed)
Your labs were normal. Follow up with GI tomorrow. Return to the ER for new or worsening symptoms.

## 2016-01-29 NOTE — ED Provider Notes (Signed)
[facility]-EM[facility]ENCY DEPT Provider Note   CSN: [id number] Arrival date & time: 01/29/16  1016   History   Chief Complaint Chief Complaint  Patient presents with  . Shortness of Breath    HPI  Justin Scott is an 65 y.o. male with history of cryptogenic cirrhosis, recurrent ascites and LE edema, anemia of chronic disease, diabetes, CHF, who presents to the ED for evaluation of SOB. He is a poor historian so history is limited. He is accompanied by his brother-in-law who adds some history. Pt was discharged on 10/4 after an overnight admission for increased abdominal swelling and lower extremity edema. He had an IR paracentesis and adjustments made to his lasix and aldactone. Since discharge pt states his abdomen has become very distended again and it is difficult for him to breathe. He states his legs are swollen too. He denies any pain. He specifically denies chest pain. He was hoping to make it to a GI f/u appt tomorrow and start making arrangements for serial outpatient paracenteses but states he cannot take it anymore and is here in the ED. [name] brother states pt lives at home alone and is unsure if there has been any confusion or falls. During admission home health nurse was arranged for but pt states no one has come to his house yet. He states he has been taking his home medications as prescribed.  Past Medical History:  Diagnosis Date  . AKI (acute kidney injury) (HCC) 01/2016  . Anemia 02/2013   transfused PRBC x 3.  bled in setting of cirrhosis, esophageal varices, portal htn.   . Ascites   . Cirrhosis (HCC)   . Congestive heart failure (CHF) (HCC)   . Diabetes mellitus without complication (HCC)   . Thrombocytopenia (HCC) 01/2016    Patient Active Problem List   Diagnosis Date Noted  . DM type 2, goal HbA1c < 7% (HCC)   . Nonadherence to medical treatment 01/22/2016  . Acute kidney injury (HCC) 01/22/2016  . Hyponatremia 01/22/2016  . Left hip pain 01/22/2016  . Cirrhosis of  liver with ascites (HCC)   . Gastric erosions   . Cirrhosis (HCC)   . Fluid overload 12/25/2015  . Prolonged Q-T interval on ECG 12/25/2015  . Absolute anemia   . Ascites   . Cryptogenic cirrhosis (HCC)   . Portal hypertension (HCC)   . Abnormal CT of liver   . Anemia of chronic disease   . Hepatic cirrhosis (HCC) 03/10/2013  . Acute upper GI bleed 03/10/2013  . Diabetes mellitus, type 2 (HCC) 03/10/2013  . Esophageal varices with bleeding (HCC) 03/10/2013  . Acute blood loss anemia 03/10/2013  . Transaminitis 03/10/2013  . Thrombocytopenia (HCC) 03/10/2013    Past Surgical History:  Procedure Laterality Date  . ESOPHAGOGASTRODUODENOSCOPY N/A 03/10/2013   3 trunks of small to medium sized distal esophagus varices. None had clear signs of recent bleeding however there was hematin in stomach was well as a small amount of fresh red blood at GE junction. There was moderate portal gastropathy throughout the stomach. Dr Christella HartiganJacobs placed 7 variceal ligating bands (one misfired) without immediate complication.  . ESOPHAGOGASTRODUODENOSCOPY N/A 12/27/2015   Procedure: ESOPHAGOGASTRODUODENOSCOPY (EGD);  Surgeon: Hilarie FredricksonJohn N Perry, MD;  [facility] END[facility]COPY;  Service: Endoscopy;  Laterality: N/A;       Home Medications    Prior to Admission medications   Medication Sig Start Date End Date Taking? Authorizing Provider  acetaminophen (TYLENOL) 325 MG tablet Take 650 mg by mouth every  6 (six) hours as needed for moderate pain (in hip).    Historical Provider, MD  bisacodyl (DULCOLAX) 5 MG EC tablet Take 1 tablet (5 mg total) by mouth daily as needed for moderate constipation. 12/28/15   Leroy Sea, MD  ferrous sulfate 325 (65 FE) MG tablet Take 1 tablet (325 mg total) by mouth 2 (two) times daily with a meal. 12/28/15   Leroy Sea, MD  furosemide (LASIX) 40 MG tablet Take 1 tablet (40 mg total) by mouth 2 (two) times daily. 01/23/16   Calvert Cantor, MD  glimepiride (AMARYL) 4 MG tablet Take 4  mg by mouth daily with breakfast.    Historical Provider, MD  pantoprazole (PROTONIX) 40 MG tablet Take 1 tablet (40 mg total) by mouth daily. 12/28/15   Leroy Sea, MD  propranolol (INDERAL) 10 MG tablet Take 1 tablet (10 mg total) by mouth 3 (three) times daily. 12/28/15   Leroy Sea, MD  spironolactone (ALDACTONE) 100 MG tablet Take 1 tablet (100 mg total) by mouth 2 (two) times daily. 01/23/16   Calvert Cantor, MD    Family History Family History  Problem Relation Age of Onset  . Diabetes Mother   . Heart disease Father   . Diabetes Father     Social History Social History  Substance Use Topics  . Smoking status: Former Games developer  . Smokeless tobacco: Never Used  . Alcohol use No     Allergies   Review of patient's allergies indicates no known allergies.   Review of Systems Review of Systems 10 Systems reviewed and are negative for acute change except as noted in the HPI.   Physical Exam Updated Vital Signs BP 123/86 (BP Location: Left Arm)   Pulse 90   Temp 97.7 F (36.5 C) (Oral)   Resp 23   SpO2 100%   Physical Exam  Constitutional: He is oriented to person, place, and time.  Chronically ill appearing  HENT:  Right Ear: External ear normal.  Left Ear: External ear normal.  Nose: Nose normal.  Mouth/Throat: Oropharynx is clear and moist. No oropharyngeal exudate.  Eyes: Conjunctivae are normal.  Neck: Neck supple.  Cardiovascular: Normal rate, regular rhythm, normal heart sounds and intact distal pulses.   Pulmonary/Chest:  Shallow breathing Diminished breath sounds throughout  Abdominal: He exhibits distension. There is no tenderness. There is no rebound and no guarding.  Abdomen distended and taut. nontender  Musculoskeletal:  3+ pitting edema bilateral lower extremities  Lymphadenopathy:    He has no cervical adenopathy.  Neurological: He is alert and oriented to person, place, and time. No cranial nerve deficit.  Skin: Skin is warm and dry.    Psychiatric: He has a normal mood and affect.  Nursing note and vitals reviewed.    ED Treatments / Results  Labs (all labs ordered are listed, but only abnormal results are displayed) Labs Reviewed  CBC - Abnormal; Notable for the following:       Result Value   MCH 25.7 (*)    All other components within normal limits  COMPREHENSIVE METABOLIC PANEL - Abnormal; Notable for the following:    Sodium 132 (*)    Chloride 99 (*)    Glucose, Bld 208 (*)    BUN 44 (*)    Creatinine, Ser 1.86 (*)    Total Protein 8.2 (*)    Albumin 2.5 (*)    ALT 16 (*)    Total Bilirubin 1.7 (*)  GFR calc non Af Amer 37 (*)    GFR calc Af Amer 43 (*)    All other components within normal limits  BRAIN NATRIURETIC PEPTIDE  PROTIME-INR  I-STAT TROPOININ, ED    EKG  EKG Interpretation None       Radiology Dg Chest 2 View  Result Date: 01/29/2016 CLINICAL DATA:  Shortness of breath, abdominal distension, onset of mobile 80s yesterday. Several day history of productive cough. History of CHF and diabetes. EXAM: CHEST  2 VIEW COMPARISON:  Portable chest x-ray of January 22, 2016 FINDINGS: The lungs remain hypoinflated. Patchy bibasilar densities are present and stable. The cardiac silhouette is enlarged. The pulmonary vascularity is not engorged. There is calcification in the wall of the aortic arch. Bilateral hypo inflation. Bibasilar atelectasis or scarring. There is no pneumothorax. The bony thorax exhibits no acute abnormality. There is mild multilevel degenerative disc disease of the lower thoracic spine. IMPRESSION: Hypoinflation with chronic bibasilar atelectasis or scarring. No alveolar pneumonia nor pulmonary edema. Stable cardiomegaly. Aortic atherosclerosis. Electronically Signed   By: David  [place] M.D.   On: 01/29/2016 11:32    Procedures Procedures (including critical care time)  Medications Ordered in ED Medications  furosemide (LASIX) injection 40 mg (40 mg Intravenous Given  01/29/16 1309)     Initial Impression / Assessment and Plan / ED Course  I have reviewed the triage vital signs and the nursing notes.  Pertinent labs & imaging results that were available during my care of the patient were reviewed by me and considered in my medical decision making (see chart for details).  Clinical Course    Pt afebrile, no white count. Chronically ill but nontoxic appearing. CXR stable. I have ordered IR ultrasound guided therapeutic paracentesis. Discussed with Dr. Fredderick Phenix. We feel SBP unlikely. Given this chronic recurrent issue will order paracentesis for comfort but no additional testing of peritoneal fluid today.  3:24 PM Pt continues to rest comfortably. Paracentesis pending. Plan is to discharge home after paracentesis.  Final Clinical Impressions(s) / ED Diagnoses   Final diagnoses:  None    New Prescriptions New Prescriptions   No medications on file     Carlene Coria, Cordelia Poche 01/30/16 9604    Rolan Bucco, MD 01/30/16 1452

## 2016-01-29 NOTE — ED Notes (Signed)
Justin BodyGeorge Scott - brother-in-law - [phone]

## 2016-01-30 ENCOUNTER — Encounter: Payer: Self-pay | Admitting: Gastroenterology

## 2016-01-30 ENCOUNTER — Observation Stay (HOSPITAL_COMMUNITY): Payer: BC Managed Care – PPO

## 2016-01-30 ENCOUNTER — Ambulatory Visit (INDEPENDENT_AMBULATORY_CARE_PROVIDER_SITE_OTHER): Payer: BC Managed Care – PPO | Admitting: Gastroenterology

## 2016-01-30 ENCOUNTER — Encounter (INDEPENDENT_AMBULATORY_CARE_PROVIDER_SITE_OTHER): Payer: Self-pay

## 2016-01-30 ENCOUNTER — Inpatient Hospital Stay (HOSPITAL_COMMUNITY)
Admission: AD | Admit: 2016-01-30 | Discharge: 2016-02-07 | DRG: 441 | Disposition: A | Discharge status: Skilled Nursing Facility | Payer: BC Managed Care – PPO | Source: Ambulatory Visit | Attending: Internal Medicine | Admitting: Internal Medicine

## 2016-01-30 ENCOUNTER — Encounter (HOSPITAL_COMMUNITY): Payer: Self-pay | Admitting: General Practice

## 2016-01-30 VITALS — BP 116/70 | HR 92 | Ht 65.0 in | Wt 194.2 lb

## 2016-01-30 DIAGNOSIS — Z833 Family history of diabetes mellitus: Secondary | ICD-10-CM

## 2016-01-30 DIAGNOSIS — G9340 Encephalopathy, unspecified: Secondary | ICD-10-CM | POA: Diagnosis not present

## 2016-01-30 DIAGNOSIS — E875 Hyperkalemia: Secondary | ICD-10-CM | POA: Diagnosis not present

## 2016-01-30 DIAGNOSIS — Z66 Do not resuscitate: Secondary | ICD-10-CM | POA: Diagnosis not present

## 2016-01-30 DIAGNOSIS — R188 Other ascites: Secondary | ICD-10-CM

## 2016-01-30 DIAGNOSIS — E1122 Type 2 diabetes mellitus with diabetic chronic kidney disease: Secondary | ICD-10-CM | POA: Diagnosis present

## 2016-01-30 DIAGNOSIS — Z9119 Patient's noncompliance with other medical treatment and regimen: Secondary | ICD-10-CM

## 2016-01-30 DIAGNOSIS — G9341 Metabolic encephalopathy: Secondary | ICD-10-CM | POA: Diagnosis present

## 2016-01-30 DIAGNOSIS — K219 Gastro-esophageal reflux disease without esophagitis: Secondary | ICD-10-CM | POA: Diagnosis present

## 2016-01-30 DIAGNOSIS — E46 Unspecified protein-calorie malnutrition: Secondary | ICD-10-CM | POA: Diagnosis present

## 2016-01-30 DIAGNOSIS — N179 Acute kidney failure, unspecified: Secondary | ICD-10-CM | POA: Diagnosis present

## 2016-01-30 DIAGNOSIS — K7469 Other cirrhosis of liver: Secondary | ICD-10-CM | POA: Diagnosis not present

## 2016-01-30 DIAGNOSIS — K7290 Hepatic failure, unspecified without coma: Secondary | ICD-10-CM

## 2016-01-30 DIAGNOSIS — I959 Hypotension, unspecified: Secondary | ICD-10-CM | POA: Diagnosis not present

## 2016-01-30 DIAGNOSIS — D696 Thrombocytopenia, unspecified: Secondary | ICD-10-CM | POA: Diagnosis not present

## 2016-01-30 DIAGNOSIS — K7460 Unspecified cirrhosis of liver: Secondary | ICD-10-CM | POA: Diagnosis present

## 2016-01-30 DIAGNOSIS — K766 Portal hypertension: Secondary | ICD-10-CM | POA: Diagnosis present

## 2016-01-30 DIAGNOSIS — I8510 Secondary esophageal varices without bleeding: Secondary | ICD-10-CM | POA: Diagnosis present

## 2016-01-30 DIAGNOSIS — K7581 Nonalcoholic steatohepatitis (NASH): Secondary | ICD-10-CM | POA: Diagnosis present

## 2016-01-30 DIAGNOSIS — E871 Hypo-osmolality and hyponatremia: Secondary | ICD-10-CM | POA: Diagnosis present

## 2016-01-30 DIAGNOSIS — Z91199 Patient's noncompliance with other medical treatment and regimen due to unspecified reason: Secondary | ICD-10-CM

## 2016-01-30 DIAGNOSIS — Z515 Encounter for palliative care: Secondary | ICD-10-CM

## 2016-01-30 DIAGNOSIS — N182 Chronic kidney disease, stage 2 (mild): Secondary | ICD-10-CM | POA: Diagnosis present

## 2016-01-30 DIAGNOSIS — Z8249 Family history of ischemic heart disease and other diseases of the circulatory system: Secondary | ICD-10-CM

## 2016-01-30 DIAGNOSIS — K7682 Hepatic encephalopathy: Secondary | ICD-10-CM | POA: Diagnosis present

## 2016-01-30 HISTORY — DX: Unspecified cirrhosis of liver: K74.60

## 2016-01-30 HISTORY — DX: Gastro-esophageal reflux disease without esophagitis: K21.9

## 2016-01-30 HISTORY — DX: Type 2 diabetes mellitus without complications: E11.9

## 2016-01-30 HISTORY — DX: Portal hypertension: K76.6

## 2016-01-30 HISTORY — DX: Gastrointestinal hemorrhage, unspecified: K92.2

## 2016-01-30 LAB — COMPREHENSIVE METABOLIC PANEL
ALT: 17 U/L (ref 17–63)
AST: 31 U/L (ref 15–41)
Albumin: 2 g/dL — ABNORMAL LOW (ref 3.5–5.0)
Alkaline Phosphatase: 84 U/L (ref 38–126)
Anion gap: 9 (ref 5–15)
BUN: 41 mg/dL — AB (ref 6–20)
CHLORIDE: 99 mmol/L — AB (ref 101–111)
CO2: 25 mmol/L (ref 22–32)
CREATININE: 1.77 mg/dL — AB (ref 0.61–1.24)
Calcium: 8.6 mg/dL — ABNORMAL LOW (ref 8.9–10.3)
GFR calc Af Amer: 46 mL/min — ABNORMAL LOW (ref >60)
GFR, EST NON AFRICAN AMERICAN: 39 mL/min — AB (ref >60)
Glucose, Bld: 168 mg/dL — ABNORMAL HIGH (ref 65–99)
Potassium: 4.6 mmol/L (ref 3.5–5.1)
SODIUM: 133 mmol/L — AB (ref 135–145)
Total Bilirubin: 1.2 mg/dL (ref 0.3–1.2)
Total Protein: 6.9 g/dL (ref 6.5–8.1)

## 2016-01-30 LAB — CBC WITH DIFFERENTIAL/PLATELET
BASOS ABS: 0 10*3/uL (ref 0.0–0.1)
Basophils Relative: 0 %
EOS ABS: 0.1 10*3/uL (ref 0.0–0.7)
EOS PCT: 1 %
HCT: 38.4 % — ABNORMAL LOW (ref 39.0–52.0)
Hemoglobin: 12.4 g/dL — ABNORMAL LOW (ref 13.0–17.0)
LYMPHS ABS: 0.8 10*3/uL (ref 0.7–4.0)
Lymphocytes Relative: 9 %
MCH: 26.1 pg (ref 26.0–34.0)
MCHC: 32.3 g/dL (ref 30.0–36.0)
MCV: 80.8 fL (ref 78.0–100.0)
MONO ABS: 1.1 10*3/uL — AB (ref 0.1–1.0)
Monocytes Relative: 12 %
NEUTROS PCT: 78 %
Neutro Abs: 7 10*3/uL (ref 1.7–7.7)
PLATELETS: 130 10*3/uL — AB (ref 150–400)
RBC: 4.75 MIL/uL (ref 4.22–5.81)
WBC: 9 10*3/uL (ref 4.0–10.5)

## 2016-01-30 LAB — GLUCOSE, CAPILLARY
GLUCOSE-CAPILLARY: 69 mg/dL (ref 65–99)
Glucose-Capillary: 126 mg/dL — ABNORMAL HIGH (ref 65–99)

## 2016-01-30 LAB — PROTIME-INR
INR: 1.93
PROTHROMBIN TIME: 22.3 s — AB (ref 11.4–15.2)

## 2016-01-30 LAB — URINALYSIS, ROUTINE W REFLEX MICROSCOPIC
BILIRUBIN URINE: NEGATIVE
Glucose, UA: NEGATIVE mg/dL
HGB URINE DIPSTICK: NEGATIVE
Ketones, ur: NEGATIVE mg/dL
LEUKOCYTES UA: NEGATIVE
Nitrite: NEGATIVE
PROTEIN: NEGATIVE mg/dL
Specific Gravity, Urine: 1.018 (ref 1.005–1.030)
pH: 6 (ref 5.0–8.0)

## 2016-01-30 LAB — MAGNESIUM: Magnesium: 2.2 mg/dL (ref 1.7–2.4)

## 2016-01-30 LAB — AMMONIA: AMMONIA: 119 umol/L — AB (ref 9–35)

## 2016-01-30 LAB — PHOSPHORUS: PHOSPHORUS: 3.8 mg/dL (ref 2.5–4.6)

## 2016-01-30 LAB — PREALBUMIN: PREALBUMIN: 5.9 mg/dL — AB (ref 18–38)

## 2016-01-30 LAB — BRAIN NATRIURETIC PEPTIDE: B Natriuretic Peptide: 29.4 pg/mL (ref 0.0–100.0)

## 2016-01-30 LAB — LIPASE, BLOOD: LIPASE: 75 U/L — AB (ref 11–51)

## 2016-01-30 LAB — APTT: aPTT: 33 seconds (ref 24–36)

## 2016-01-30 LAB — TSH: TSH: 0.302 u[IU]/mL — AB (ref 0.350–4.500)

## 2016-01-30 MED ORDER — INSULIN ASPART 100 UNIT/ML ~~LOC~~ SOLN
0.0000 [IU] | Freq: Every day | SUBCUTANEOUS | Status: DC
  Administered 2016-02-04: 2 [IU] via SUBCUTANEOUS
  Administered 2016-02-05 – 2016-02-06 (×2): 3 [IU] via SUBCUTANEOUS

## 2016-01-30 MED ORDER — RIFAXIMIN 200 MG PO TABS
200.0000 mg | ORAL_TABLET | Freq: Three times a day (TID) | ORAL | Status: DC
  Administered 2016-01-31: 200 mg via ORAL
  Filled 2016-01-30 (×4): qty 1

## 2016-01-30 MED ORDER — PANTOPRAZOLE SODIUM 40 MG PO TBEC
40.0000 mg | DELAYED_RELEASE_TABLET | Freq: Every day | ORAL | Status: DC
  Administered 2016-01-30 – 2016-02-07 (×9): 40 mg via ORAL
  Filled 2016-01-30 (×9): qty 1

## 2016-01-30 MED ORDER — SODIUM CHLORIDE 0.9 % IV SOLN: 250.0000 mL | INTRAVENOUS | Status: DC | PRN

## 2016-01-30 MED ORDER — INSULIN ASPART 100 UNIT/ML ~~LOC~~ SOLN
0.0000 [IU] | Freq: Three times a day (TID) | SUBCUTANEOUS | Status: DC
  Administered 2016-01-31: 2 [IU] via SUBCUTANEOUS
  Administered 2016-01-31: 1 [IU] via SUBCUTANEOUS
  Administered 2016-02-01: 3 [IU] via SUBCUTANEOUS
  Administered 2016-02-01: 2 [IU] via SUBCUTANEOUS
  Administered 2016-02-02: 1 [IU] via SUBCUTANEOUS
  Administered 2016-02-02: 3 [IU] via SUBCUTANEOUS
  Administered 2016-02-02: 2 [IU] via SUBCUTANEOUS
  Administered 2016-02-03: 3 [IU] via SUBCUTANEOUS
  Administered 2016-02-03: 1 [IU] via SUBCUTANEOUS
  Administered 2016-02-03: 5 [IU] via SUBCUTANEOUS
  Administered 2016-02-04: 1 [IU] via SUBCUTANEOUS
  Administered 2016-02-04 – 2016-02-05 (×3): 3 [IU] via SUBCUTANEOUS
  Administered 2016-02-05 – 2016-02-06 (×3): 2 [IU] via SUBCUTANEOUS
  Administered 2016-02-06: 3 [IU] via SUBCUTANEOUS
  Administered 2016-02-07 (×2): 2 [IU] via SUBCUTANEOUS

## 2016-01-30 MED ORDER — DEXTROSE 50 % IV SOLN
INTRAVENOUS | Status: AC
  Administered 2016-01-30: 25 mL
  Filled 2016-01-30: qty 50

## 2016-01-30 MED ORDER — SODIUM CHLORIDE 0.9% FLUSH
3.0000 mL | INTRAVENOUS | Status: DC | PRN
  Administered 2016-02-03: 3 mL via INTRAVENOUS
  Filled 2016-01-30: qty 3

## 2016-01-30 MED ORDER — LACTULOSE 10 GM/15ML PO SOLN
20.0000 g | Freq: Two times a day (BID) | ORAL | Status: DC
  Administered 2016-01-30 – 2016-02-07 (×16): 20 g via ORAL
  Filled 2016-01-30 (×16): qty 30

## 2016-01-30 MED ORDER — SODIUM CHLORIDE 0.9% FLUSH
3.0000 mL | Freq: Two times a day (BID) | INTRAVENOUS | Status: DC
  Administered 2016-01-30 – 2016-02-07 (×15): 3 mL via INTRAVENOUS

## 2016-01-30 MED ORDER — FUROSEMIDE 40 MG PO TABS
40.0000 mg | ORAL_TABLET | Freq: Two times a day (BID) | ORAL | Status: DC
  Administered 2016-01-30 – 2016-02-02 (×7): 40 mg via ORAL
  Filled 2016-01-30 (×7): qty 1

## 2016-01-30 MED ORDER — LACTULOSE ENEMA
300.0000 mL | Freq: Once | ORAL | Status: AC
  Administered 2016-01-30: 300 mL via RECTAL
  Filled 2016-01-30: qty 300

## 2016-01-30 MED ORDER — SPIRONOLACTONE 25 MG PO TABS
100.0000 mg | ORAL_TABLET | Freq: Two times a day (BID) | ORAL | Status: DC
  Administered 2016-01-30 – 2016-02-04 (×10): 100 mg via ORAL
  Filled 2016-01-30 (×11): qty 4

## 2016-01-30 MED ORDER — PROPRANOLOL HCL 10 MG PO TABS
10.0000 mg | ORAL_TABLET | Freq: Three times a day (TID) | ORAL | Status: DC
  Administered 2016-01-30 – 2016-02-06 (×22): 10 mg via ORAL
  Filled 2016-01-30 (×26): qty 1

## 2016-01-30 NOTE — H&P (Signed)
History and Physical    Justin Scott WJX:[id number] DOB: 1950-06-25 DOA: 01/30/2016  PCP: Kaleen Mask, MD Patient coming from: dr Christella Hartigan office  Chief Complaint: confusion  HPI: Justin Scott is a 65 y.o. male with medical history significant of cryptogenic cirrhosis with associated esophageal varices and prior bleeding incidents, chronic, cytopenias secondary to cirrhosis, recurrent ascites with lower extremity edema, anemia of chronic disease, chronic hyponatremia, diabetes on oral agent and history of prior prolonged QT on ECG. The patient was discharged from the hospital after an overnight stay on 01/29/2016 due to abdominal ascites requiring paracentesis and subsequent ED evaluation on 01/29/2016 for paracentesis again. His workups of nausea and any evidence of SBP or other acute process outside of known cryptogenic cirrhosis with recurrent ascites. Patient was evaluated by Dr. Christella Hartigan this afternoon in his clinic. There is concern for worsening confusional state and progressive generalized weakness. Patient was admitted for further workup. Level V caveat applies as patient is fairly somnolent and somewhat confused about specific details of his overall health. Patient is alert and oriented 3.   ED Course: none   Review of Systems: As per HPI otherwise 10 point review of systems negative.   Ambulatory Status: minimally ambulatory  Past Medical History:  Diagnosis Date  . AKI (acute kidney injury) (HCC) 01/2016  . Anemia 02/2013   transfused PRBC x 3.  bled in setting of cirrhosis, esophageal varices, portal htn.   . Ascites   . Congestive heart failure (CHF) (HCC)   . GERD (gastroesophageal reflux disease)   . Hepatic cirrhosis (HCC)    [name] 12/25/2015  . Portal hypertension (HCC)    [name] 12/25/2015  . Thrombocytopenia (HCC) 01/2016  . Type II diabetes mellitus (HCC)   . Upper GI bleeding 02/2013   November 2014 with upper GI bleeding requiring 3 units of  blood/notes 12/25/2015    Past Surgical History:  Procedure Laterality Date  . CARDIAC CATHETERIZATION    . ESOPHAGOGASTRODUODENOSCOPY N/A 03/10/2013   3 trunks of small to medium sized distal esophagus varices. None had clear signs of recent bleeding however there was hematin in stomach was well as a small amount of fresh red blood at GE junction. There was moderate portal gastropathy throughout the stomach. Dr Christella Hartigan placed 7 variceal ligating bands (one misfired) without immediate complication.  . ESOPHAGOGASTRODUODENOSCOPY N/A 12/27/2015   Procedure: ESOPHAGOGASTRODUODENOSCOPY (EGD);  Surgeon: Hilarie Fredrickson, MD;  Location: [facility] ENDOSCOPY;  Service: Endoscopy;  Laterality: N/A;    Social History   Social History  . Marital status: Married    Spouse name: N/A  . Number of children: 0  . 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 Smoking status: Never Smoker  . Smokeless tobacco: Former Neurosurgeon     Comment: 01/30/2016 "quit chewing in the late 1990s"  . Alcohol use No  . Drug use: No  . Sexual activity: Not Currently   Other Topics Concern  . Not on file   Social History Narrative  . No narrative on file    No Known Allergies  Family History  Problem Relation Age of Onset  . Diabetes Mother   . Heart disease Father   . Diabetes Father     Prior to Admission medications   Medication Sig Start Date End Date Taking? Authorizing Provider  bisacodyl (DULCOLAX) 5 MG EC tablet Take 1 tablet (5 mg total) by mouth daily  as needed for moderate constipation. 12/28/15   Leroy Sea, MD  ferrous sulfate 325 (65 FE) MG tablet Take 1 tablet (325 mg total) by mouth 2 (two) times daily with a meal. 12/28/15   Leroy Sea, MD  furosemide (LASIX) 40 MG tablet Take 1 tablet (40 mg total) by mouth 2 (two) times daily. 01/23/16   Calvert Cantor, MD  glimepiride (AMARYL) 4 MG tablet Take 4 mg by mouth daily with  breakfast.    Historical Provider, MD  pantoprazole (PROTONIX) 40 MG tablet Take 1 tablet (40 mg total) by mouth daily. 12/28/15   Leroy Sea, MD  propranolol (INDERAL) 10 MG tablet Take 1 tablet (10 mg total) by mouth 3 (three) times daily. 12/28/15   Leroy Sea, MD  spironolactone (ALDACTONE) 100 MG tablet Take 1 tablet (100 mg total) by mouth 2 (two) times daily. 01/23/16   Calvert Cantor, MD    Physical Exam: Vitals:   01/30/16 1355  BP: 123/69  Pulse: 77  Resp: 20  Temp: 97.6 F (36.4 C)  TempSrc: Oral  SpO2: 99%     General:  Appears calm and comfortable Eyes:  PERRL, EOMI, normal lids, iris ENT:  grossly normal hearing, lips & tongue, mmm Neck:  no LAD, masses or thyromegaly Cardiovascular:  RRR, 2/6 systolic murmur, 1+ bilateral lower extremity edema   Respiratory:  CTA bilaterally, no w/r/r. Normal respiratory effort. Abdomen: Mildly distended, soft, NABS Skin: Diffuse ecchymoses, no significant rash  Musculoskeletal:  grossly weak tone BUE/BLE, good ROM, no bony abnormality Psychiatric: Alert and oriented 3, pleasant, somnolent, Neurologic:  CN 2-12 grossly intact, moves all extremities in coordinated fashion, sensation intact  Labs on Admission: I have personally reviewed following labs and imaging studies  CBC:  Recent Labs Lab 01/29/16 1040 01/30/16 1525  WBC 9.8 9.0  NEUTROABS  --  7.0  HGB 13.5 12.4*  HCT 43.3 38.4*  MCV 82.3 80.8  PLT 160 130*   Basic Metabolic Panel:  Recent Labs Lab 01/29/16 1040 01/30/16 1525  NA 132* 133*  K 4.7 4.6  CL 99* 99*  CO2 22 25  GLUCOSE 208* 168*  BUN 44* 41*  CREATININE 1.86* 1.77*  CALCIUM 9.1 8.6*  MG  --  2.2  PHOS  --  3.8   GFR: Estimated Creatinine Clearance: 44.1 mL/min (by C-G formula based on SCr of 1.77 mg/dL (H)). Liver Function Tests:  Recent Labs Lab 01/29/16 1040 01/30/16 1525  AST 31 31  ALT 16* 17  ALKPHOS 91 84  BILITOT 1.7* 1.2  PROT 8.2* 6.9  ALBUMIN 2.5* 2.0*     Recent Labs Lab 01/30/16 1525  LIPASE 75*    Recent Labs Lab 01/30/16 1525  AMMONIA 119*   Coagulation Profile:  Recent Labs Lab 01/29/16 1330 01/30/16 1525  INR 1.69 1.93   Cardiac Enzymes: No results for input(s): CKTOTAL, CKMB, CKMBINDEX, TROPONINI in the last 168 hours. BNP (last 3 results) No results for input(s): PROBNP in the last 8760 hours. HbA1C: No results for input(s): HGBA1C in the last 72 hours. CBG: No results for input(s): GLUCAP in the last 168 hours. Lipid Profile: No results for input(s): CHOL, HDL, LDLCALC, TRIG, CHOLHDL, LDLDIRECT in the last 72 hours. Thyroid Function Tests:  Recent Labs  01/30/16 1525  TSH 0.302*   Anemia Panel: No results for input(s): VITAMINB12, FOLATE, FERRITIN, TIBC, IRON, RETICCTPCT in the last 72 hours. Urine analysis:    Component Value Date/Time   COLORURINE AMBER (A)  01/30/2016 1507   APPEARANCEUR CLEAR 01/30/2016 1507   LABSPEC 1.018 01/30/2016 1507   PHURINE 6.0 01/30/2016 1507   GLUCOSEU NEGATIVE 01/30/2016 1507   HGBUR NEGATIVE 01/30/2016 1507   BILIRUBINUR NEGATIVE 01/30/2016 1507   KETONESUR NEGATIVE 01/30/2016 1507   PROTEINUR NEGATIVE 01/30/2016 1507   UROBILINOGEN 0.2 03/09/2013 2318   NITRITE NEGATIVE 01/30/2016 1507   LEUKOCYTESUR NEGATIVE 01/30/2016 1507    Creatinine Clearance: Estimated Creatinine Clearance: 44.1 mL/min (by C-G formula based on SCr of 1.77 mg/dL (H)).  Sepsis Labs: @LABRCNTIP (procalcitonin:4,lacticidven:4) ) Recent Results (from the past 240 hour(s))  Culture, body fluid-bottle     Status: None   Collection Time: 01/22/16  3:55 PM  Result Value Ref Range Status   Specimen Description FLUID PERITONEAL ABDOMEN  Final   Special Requests BOTTLES DRAWN AEROBIC AND ANAEROBIC 10CC  Final   Culture No growth aerobically or anaerobically.  Final   Report Status 01/27/2016 FINAL  Final  Gram stain     Status: None   Collection Time: 01/22/16  3:55 PM  Result Value Ref  Range Status   Specimen Description FLUID PERITONEAL ABDOMEN  Final   Special Requests NONE  Final   Gram Stain   Final    MODERATE WBC PRESENT,BOTH PMN AND MONONUCLEAR NO ORGANISMS SEEN    Report Status 01/22/2016 FINAL  Final     Radiological Exams on Admission: Dg Chest 2 View  Result Date: 01/29/2016 CLINICAL DATA:  Shortness of breath, abdominal distension, onset of mobile 80s yesterday. Several day history of productive cough. History of CHF and diabetes. EXAM: CHEST  2 VIEW COMPARISON:  Portable chest x-ray of January 22, 2016 FINDINGS: The lungs remain hypoinflated. Patchy bibasilar densities are present and stable. The cardiac silhouette is enlarged. The pulmonary vascularity is not engorged. There is calcification in the wall of the aortic arch. Bilateral hypo inflation. Bibasilar atelectasis or scarring. There is no pneumothorax. The bony thorax exhibits no acute abnormality. There is mild multilevel degenerative disc disease of the lower thoracic spine. IMPRESSION: Hypoinflation with chronic bibasilar atelectasis or scarring. No alveolar pneumonia nor pulmonary edema. Stable cardiomegaly. Aortic atherosclerosis. Electronically Signed   By: Bethsaida Siegenthaler  [place] M.D.   On: 01/29/2016 11:32   Dg Abd 1 View  Result Date: 01/30/2016 CLINICAL DATA:  Possible abdominal ascites, history of recent paracentesis EXAM: ABDOMEN - 1 VIEW COMPARISON:  None. FINDINGS: Scattered large and small bowel gas is noted. No abnormal mass or abnormal calcifications are noted. No findings to suggest recurrent ascites are seen. No free air is noted. Degenerative change of the lumbar spine is seen. IMPRESSION: No acute abnormality noted. Electronically Signed   By: Alcide CleverMark  Lukens M.D.   On: 01/30/2016 18:12   [place] Paracentesis  Result Date: 01/30/2016 CLINICAL DATA:  Cirrhosis.  Recurrent abdominal ascites. EXAM: ULTRASOUND GUIDED PARACENTESIS TECHNIQUE: The procedure, risks (including but not limited to bleeding,  infection, organ damage ), benefits, and alternatives were explained to the patient. Questions regarding the procedure were encouraged and answered. The patient understands and consents to the procedure. Survey ultrasound of the abdomen was performed and an appropriate skin entry site in the right lower abdomen was selected. Skin site was marked, prepped with chlorhexidine, and draped in usual sterile fashion, and infiltrated locally with 1% lidocaine. A Safe-T-Centesis sheath needle was advanced into the peritoneal space until fluid could be aspirated. The sheath was advanced and the needle removed. 5 L of cloudy yellowascites were aspirated. COMPLICATIONS: COMPLICATIONS none IMPRESSION: Technically successful ultrasound  guided paracentesis, removing 5 L of chylous ascites. Electronically Signed   By: Corlis Leak M.D.   On: 01/30/2016 09:26    EKG: Independently reviewed.   Assessment/Plan Principal Problem:   Acute encephalopathy Active Problems:   Hepatic cirrhosis (HCC)   Thrombocytopenia (HCC)   Portal hypertension (HCC)   Nonadherence to medical treatment   Acute kidney injury (HCC)   Hyponatremia   Cirrhosis of liver with ascites (HCC)   Acute encephalopathy: Likely multifactorial including hyperammonemia (119 on admission) and general physical deconditioning due to poor nutrition and advanced cirrhosis and likely mild involvement from significant hypothyroidism. No focal neurological deficits or infectious etiologies immediately clear. Doubt SBP given multiple recent paracentesis with reassuring fluid studies. Patient not previously on lactulose. Per discussion with Dr. Christella Hartigan, pt has not come to his office for follow-up for several years. - Lactulose PR and by mouth, rectal tube - Blood culture,  - Free T3, T4 - Dr. Christella Hartigan to see in consultation  Cryptogenic cirrhosis: Associated esophageal varices and portal hypertension. Chronic. MELD score 20. History of medical noncompliance.  Patient saw Dr. Christella Hartigan on day of admission for the first time in several years. - Continue Lasix, spironolactone, propranolol, - Paracentesis as needed, last paracentesis on 01/23/2016 and 01/29/2016 - Lactulose. Recommend discharge on lactulose - Start Xifaxan  Thyroid dysfunction: TSH noted to be subtherapeutic at 0.302. History of this patient's chart. Of note she states can be misleading especially given acute illness - Free T3, free T4 - Plus minus inpatient treatment versus outpatient repeat labs to determine true thyroid status  Diabetes: - SSI   Aki: Creatinine 1.77, baseline 0.9 elevated since previous admission. Suspect secondary to vascular congestion from end-stage cirrhosis - Renal ultrasound - If not improving prior to discharge recommend renal consult  GERD: - Continue Protonix  Anemia/from cytopenia: Likely secondary to cirrhosis. 12.4 on admission. Baseline 10. Platelet count 139 - CBC in am  PICC line placed 01/30/2016   DVT prophylaxis: SCD  Code Status: full  Family Communication: none  Disposition Plan: pending improvement  Consults called: GI  Admission status: observation    Radiance Deady J MD [facility]  If 7PM-7AM, please contact night-coverage [email or web address] Password [facility]  01/30/2016, 6:35 PM

## 2016-01-30 NOTE — Patient Instructions (Signed)
[facility]. They will call his sister.

## 2016-01-30 NOTE — Progress Notes (Signed)
Review of pertinent gastrointestinal problems: 1. Cirrhosis diagnosed 2014  CT scan 02/2013: 1. Prominent hepatic cirrhosis, with scattered complex lesions throughout the liver. Malignancy is not excluded. I recommend a follow-up non urgent hepatic protocol MRI for further characterization of these scattered liver lesions. 2. Mild wall thickening in the rectum may be incidental, but rectal tumor is not readily excluded. 3. Atherosclerosis. 4. Portal venous hypertension. 5. Gastroesophageal reflux. 6. Mildly prominent prostate gland. 7. Lumbar spondylosis and degenerative disc disease.  CT scan 12/2015:  Advanced changes of cirrhosis, portal hypertension including ascites splenomegaly and varicosities. 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 None had clear signs of recent bleeding however there was hematin in stomach was well as a small amount of fresh red blood at GE junction. There was moderate portal gastropathy throughout the stomach. I placed 7 variceal ligating bands (one misfired) without immediate complication.  EGD 12/2015 Dr. Marina Goodell for IDA, cirrhosis; small remaining varices.  AFP 02/2013 normal  Labs: 02/2014 INR 1.5, Plt low (60s), Hep B S Ag Neg, Hep A Igm total Neg, Hep C ab neg, Hep B C ab neg  Ascites neg for SBP, SAAG elevated 12/2015; admitted to hosp and was started on diuretics.    HPI: This is a  very pleasant 65 year old man who is here with his sister today.  I last saw him in the office December 2014. Looking back at notes in epic we have been trying to get him back in the office several times since then without success. He was seen last month while hospitalized for cirrhosis, ascites. Follow-up appointment a week after that admission with [facility].   Chief complaint is decompensated cirrhosis.  Encephalopathy has been a problem for the past few weeks.  Disoriented at home.  His  sister is with him today and says he is intermittently confused. He lives at home and they are worried about him taking his medicines. They're not sure he is taking any of them as recommended.  He was in the emergency room last night for recurrent ascites and had 5 L large-volume paracentesis. Fluid was not sent for any testing.  This is his fourth paracentesis in 5-6 weeks.  He admits to having BM every day or 2. He is alert and oriented 3 but slow to answer questions.  ROS: complete GI ROS as described in HPI.  Constitutional:  No unintentional weight loss   Past Medical History:  Diagnosis Date  . AKI (acute kidney injury) (HCC) 01/2016  . Anemia 02/2013   transfused PRBC x 3.  bled in setting of cirrhosis, esophageal varices, portal htn.   . Ascites   . Cirrhosis (HCC)   . Congestive heart failure (CHF) (HCC)   . Diabetes mellitus without complication (HCC)   . Thrombocytopenia (HCC) 01/2016    Past Surgical History:  Procedure Laterality Date  . ESOPHAGOGASTRODUODENOSCOPY N/A 03/10/2013   3 trunks of small to medium sized distal esophagus varices. None had clear signs of recent bleeding however there was hematin in stomach was well as a small amount of fresh red blood at GE junction. There was moderate portal gastropathy throughout the stomach. Dr Christella Hartigan placed 7 variceal ligating bands (one misfired) without immediate complication.  . ESOPHAGOGASTRODUODENOSCOPY N/A 12/27/2015   Procedure: ESOPHAGOGASTRODUODENOSCOPY (EGD);  Surgeon: Hilarie Fredrickson, MD;  Location: [facility] ENDOSCOPY;  Service: Endoscopy;  Laterality: N/A;    Current Outpatient Prescriptions  Medication Sig Dispense Refill  . bisacodyl (DULCOLAX) 5 MG EC tablet Take 1 tablet (5 mg total) by mouth daily as needed for moderate constipation. 30 tablet 0  . ferrous sulfate 325 (65 FE) MG tablet Take 1 tablet (325 mg total) by mouth 2 (two) times daily with a meal. 60 tablet 0  . furosemide (LASIX) 40 MG tablet Take 1 tablet  (40 mg total) by mouth 2 (two) times daily. 30 tablet 0  . glimepiride (AMARYL) 4 MG tablet Take 4 mg by mouth daily with breakfast.    . pantoprazole (PROTONIX) 40 MG tablet Take 1 tablet (40 mg total) by mouth daily. 30 tablet 0  . propranolol (INDERAL) 10 MG tablet Take 1 tablet (10 mg total) by mouth 3 (three) times daily. 90 tablet 0  . spironolactone (ALDACTONE) 100 MG tablet Take 1 tablet (100 mg total) by mouth 2 (two) times daily. 30 tablet 0   No current facility-administered medications for this visit.     Allergies as of 01/30/2016  . (No Known Allergies)    Family History  Problem Relation Age of Onset  . Diabetes Mother   . Heart disease Father   . Diabetes Father     Social History   Social History  . Marital status: Married    Spouse name: N/A  . Number of children: 0  . 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 Smoking status: Former Games developer  . Smokeless tobacco: Never Used  . Alcohol use No  . Drug use: No  . Sexual activity: Not on file   Other Topics Concern  . Not on file   Social History Narrative  . No narrative on file     Physical Exam: BP 116/70 (BP Location: Left Arm, Patient Position: Sitting, Cuff Size: Normal)   Pulse 92   Ht 5\' 5"  (1.651 m) Comment: height measured without shoes  Wt 194 lb 4 oz (88.1 kg)   BMI 32.32 kg/m  Constitutional: Chronically ill-appearing Psychiatric: alert and oriented x3, slow to answer questions; no asterixis Abdomen: soft, nontender, nondistended,, no peritoneal signs, normal bowel sounds; mild to moderate obvious ascites 2+ referral edema in his lower extremities  Assessment and plan: 65 y.o. male with decompensated cirrhosis with encephalopathy, fluid overload  Current MELD score is 20.  I have not seen him in about 3 years. In the past month or so he has been battling fluid overload with ascites and he has had 3  paracentesis totaling about 8 L removed. His   Current MELD score 20. He has obvious mild encephalopathy. He has 2+ pitting edema in his ankles and recurrent ascites. His sister is with him today and says  he lives at home by himself.  I do not think he is able take care of his daily needs at home in this state. His sister agrees.    I spoke with his primary care physician about the need for extended care facility or perhaps a brief hospital admission will clear up his encephalopathy reliably enough that he can go home instead. I discussed the case with Dr. Katrine Coho from [facility] who actually knows him from an admission a week or 2 ago. We are going to have him directly admitted to [facility] for decompensated cirrhosis, encephalopathy. I think to start he should be put on lactulose 30cc twice daily and continue his diuretics at Lasix 40  mg twice daily, Aldactone 100 mg twice daily. I will ask that our inpatient team see him in the morning in follow-up as well.  I doubt SBP but if he doesn't well to the lactulose then repeat paracentesis will be in order.  Will need to check UA.  He had CXR last night in ER, was normal.  Rob Buntinganiel Albertine Lafoy, MD [facility] 01/30/2016, 9:57 AM

## 2016-01-30 NOTE — Progress Notes (Signed)
Patient arrived on the floor from doctor's office accompanied bu his sister in room [phone]; alert and oriented x 4; no complaints of pain; Orient patient to room and unit; refused to watch safety video; gave patient care guide; instructed how to use the call bell and  fall risk precautions. Will continue to monitor the patient.

## 2016-01-31 ENCOUNTER — Encounter (HOSPITAL_COMMUNITY): Payer: Self-pay

## 2016-01-31 DIAGNOSIS — K7460 Unspecified cirrhosis of liver: Secondary | ICD-10-CM

## 2016-01-31 DIAGNOSIS — K7290 Hepatic failure, unspecified without coma: Secondary | ICD-10-CM

## 2016-01-31 DIAGNOSIS — Z9119 Patient's noncompliance with other medical treatment and regimen: Secondary | ICD-10-CM | POA: Diagnosis not present

## 2016-01-31 DIAGNOSIS — K7682 Hepatic encephalopathy: Secondary | ICD-10-CM

## 2016-01-31 DIAGNOSIS — G9340 Encephalopathy, unspecified: Secondary | ICD-10-CM | POA: Diagnosis not present

## 2016-01-31 LAB — COMPREHENSIVE METABOLIC PANEL
ALBUMIN: 1.9 g/dL — AB (ref 3.5–5.0)
ALT: 16 U/L — ABNORMAL LOW (ref 17–63)
ANION GAP: 8 (ref 5–15)
AST: 29 U/L (ref 15–41)
Alkaline Phosphatase: 77 U/L (ref 38–126)
BUN: 37 mg/dL — ABNORMAL HIGH (ref 6–20)
CHLORIDE: 102 mmol/L (ref 101–111)
CO2: 24 mmol/L (ref 22–32)
Calcium: 8.7 mg/dL — ABNORMAL LOW (ref 8.9–10.3)
Creatinine, Ser: 1.45 mg/dL — ABNORMAL HIGH (ref 0.61–1.24)
GFR calc Af Amer: 58 mL/min — ABNORMAL LOW (ref >60)
GFR calc non Af Amer: 50 mL/min — ABNORMAL LOW (ref >60)
GLUCOSE: 66 mg/dL (ref 65–99)
POTASSIUM: 4.2 mmol/L (ref 3.5–5.1)
SODIUM: 134 mmol/L — AB (ref 135–145)
Total Bilirubin: 1.4 mg/dL — ABNORMAL HIGH (ref 0.3–1.2)
Total Protein: 6.9 g/dL (ref 6.5–8.1)

## 2016-01-31 LAB — CBC
HEMATOCRIT: 38.3 % — AB (ref 39.0–52.0)
HEMOGLOBIN: 12.3 g/dL — AB (ref 13.0–17.0)
MCH: 26.1 pg (ref 26.0–34.0)
MCHC: 32.1 g/dL (ref 30.0–36.0)
MCV: 81.3 fL (ref 78.0–100.0)
Platelets: 127 10*3/uL — ABNORMAL LOW (ref 150–400)
RBC: 4.71 MIL/uL (ref 4.22–5.81)
WBC: 9.6 10*3/uL (ref 4.0–10.5)

## 2016-01-31 LAB — GLUCOSE, CAPILLARY
GLUCOSE-CAPILLARY: 100 mg/dL — AB (ref 65–99)
GLUCOSE-CAPILLARY: 149 mg/dL — AB (ref 65–99)
Glucose-Capillary: 61 mg/dL — ABNORMAL LOW (ref 65–99)
Glucose-Capillary: 156 mg/dL — ABNORMAL HIGH (ref 65–99)

## 2016-01-31 MED ORDER — RIFAXIMIN 550 MG PO TABS
550.0000 mg | ORAL_TABLET | Freq: Three times a day (TID) | ORAL | Status: DC
  Administered 2016-01-31 – 2016-02-01 (×3): 550 mg via ORAL
  Filled 2016-01-31 (×3): qty 1

## 2016-01-31 NOTE — Progress Notes (Addendum)
CBG 100 

## 2016-01-31 NOTE — Progress Notes (Signed)
[facility] Progress Note  Patient: Justin Scott ZOX:[id number]   PCP: Kaleen Mask, MD DOB: [birth year]   DOA: 01/30/2016   DOS: 01/31/2016   Date of Service: the patient was seen and examined on 01/31/2016  Brief hospital course: Pt. with PMH of Cryptogenic cirrhosis, portal hypertension, hepatic encephalopathy; admitted on 01/30/2016, with complaint of confusion, was found to have mild worsening of hepatic encephalopathy. Currently further plan is continue management, consult palliative care for goals of care discussion.  Assessment and Plan: 1. Acute encephalopathy Significant improvement and appears to be baseline. No asterixis on examination. We will recheck ammonia level in the morning. Continue rifaximin, increase the dose per pharmacy.  2. Cryptogenic liver cirrhosis. Appreciate GI input. With high MELD score GI recommends to discussed with palliative care provider to decide goals of care. We'll consult. Continue to monitor.  3. Acute kidney injury. Mild elevation of serum creatinine. We'll continue to monitor.  4. GERD. Continue PPI.  5. Anemia, normocytic anemia. Secondary to cirrhosis. We'll continue to monitor.  6. Low TSH. Recheck free T4 and T3.  Pain management: When necessary Tylenol Activity: Consulted physical therapy Bowel regimen: last BM 01/31/2016 Diet: Low-salt diet DVT Prophylaxis: mechanical compression device.  Advance goals of care discussion: Full code  Family Communication: no family was present at bedside, at the time of interview.    Disposition:  Discharge to SNF. Expected discharge date: 02/01/2016  Consultants: Gastroenterology Procedures: none  Antibiotics: Anti-infectives    Start     Dose/Rate Route Frequency Ordered Stop   01/31/16 1600  rifaximin (XIFAXAN) tablet 550 mg     550 mg Oral 3 times daily 01/31/16 1230     01/30/16 2200  rifaximin (XIFAXAN) tablet 200 mg  Status:  Discontinued     200 mg  Oral 3 times daily 01/30/16 1829 01/31/16 1230        Subjective: Feeling better, no abdominal pain no nausea or vomiting. No diarrhea.  Objective: Physical Exam: Vitals:   01/31/16 0754 01/31/16 1059 01/31/16 1448 01/31/16 1652  BP: 112/67 108/63 105/68 (!) 97/58  Pulse: 79 84 85 87  Resp: 18  16 17   Temp: 98.1 F (36.7 C)  98 F (36.7 C)   TempSrc: Oral  Oral   SpO2: 97% 99% 99% 97%  Weight:      Height:        Intake/Output Summary (Last 24 hours) at 01/31/16 1802 Last data filed at 01/31/16 1738  Gross per 24 hour  Intake              963 ml  Output             1275 ml  Net             -312 ml   Filed Weights   01/30/16 1355  Weight: 82.2 kg (181 lb 4.8 oz)    General: Alert, Awake and Oriented to Time, Place and Person. Appear in mild distress, affect appropriate Eyes: PERRL, Conjunctiva normal ENT: Oral Mucosa clear moist. Neck: difficult to assess JVD, no Abnormal Mass Or lumps Cardiovascular: S1 and S2 Present, no Murmur, Respiratory: Bilateral Air entry equal and Decreased, no use of accessory muscle, Clear to Auscultation, no Crackles, no wheezes Abdomen: Bowel Sound present, Soft and distended, no tenderness Skin: no redness, no Rash, no induration Extremities: no Pedal edema, no calf tenderness Neurologic: Grossly no focal neuro deficit. Bilaterally Equal motor strength No asterixis  Data Reviewed: CBC:  Recent Labs Lab  01/29/16 1040 01/30/16 1525 01/31/16 0500  WBC 9.8 9.0 9.6  NEUTROABS  --  7.0  --   HGB 13.5 12.4* 12.3*  HCT 43.3 38.4* 38.3*  MCV 82.3 80.8 81.3  PLT 160 130* 127*   Basic Metabolic Panel:  Recent Labs Lab 01/29/16 1040 01/30/16 1525 01/31/16 0500  NA 132* 133* 134*  K 4.7 4.6 4.2  CL 99* 99* 102  CO2 22 25 24   GLUCOSE 208* 168* 66  BUN 44* 41* 37*  CREATININE 1.86* 1.77* 1.45*  CALCIUM 9.1 8.6* 8.7*  MG  --  2.2  --   PHOS  --  3.8  --     Liver Function Tests:  Recent Labs Lab 01/29/16 1040  01/30/16 1525 01/31/16 0500  AST 31 31 29   ALT 16* 17 16*  ALKPHOS 91 84 77  BILITOT 1.7* 1.2 1.4*  PROT 8.2* 6.9 6.9  ALBUMIN 2.5* 2.0* 1.9*    Recent Labs Lab 01/30/16 1525  LIPASE 75*    Recent Labs Lab 01/30/16 1525  AMMONIA 119*   Coagulation Profile:  Recent Labs Lab 01/29/16 1330 01/30/16 1525  INR 1.69 1.93   Cardiac Enzymes: No results for input(s): CKTOTAL, CKMB, CKMBINDEX, TROPONINI in the last 168 hours. BNP (last 3 results) No results for input(s): PROBNP in the last 8760 hours.  CBG:  Recent Labs Lab 01/30/16 2052 01/30/16 2130 01/31/16 0631 01/31/16 0805 01/31/16 1215  GLUCAP 69 126* 61* 100* 156*    Studies: No results found.   Scheduled Meds: . furosemide  40 mg Oral BID  . insulin aspart  0-5 Units Subcutaneous QHS  . insulin aspart  0-9 Units Subcutaneous TID WC  . lactulose  20 g Oral BID  . pantoprazole  40 mg Oral Daily  . propranolol  10 mg Oral TID  . rifaximin  550 mg Oral TID  . sodium chloride flush  3 mL Intravenous Q12H  . spironolactone  100 mg Oral BID   Continuous Infusions:  PRN Meds: sodium chloride, sodium chloride flush  Time spent: 30 minutes  Author: Lynden OxfordPranav Sayer Masini, MD [facility] Pager: [phone] 01/31/2016 6:02 PM  If 7PM-7AM, please contact night-coverage at [email or web address], password [email or web address]

## 2016-01-31 NOTE — Consult Note (Signed)
[name]: 2:13 PM 01/31/2016  LOS: 0 days    Referring Provider: Dr. Allena Scott  Primary Care Physician:  Justin Mask, MD Primary Gastroenterologist:  Justin. Rob Scott   Reason for Consultation:  Follow along patient with decompensated cirrhosis.     [facility] Attending   I have taken an interval history, reviewed the chart and examined the patient. I agree with the Advanced Practitioner's note, impression and recommendations. See additions in bold  In addition to below situation complicated by non-compliance. Definitely needs a better at home or a different living situation to reduce level of illness and need for inpatient care.  Palliative care consult for goals of care very important if we can get that done.  If he takes his medication correctly and follows up life expectancy could greater than 6 months so hospice not necessary at this time I suspect.  Justin Boop, MD, [facility] [phone] (pager) [phone] after 5 PM, weekends and holidays  01/31/2016 4:27 PM    IMPRESSION:   *  Decompensated NASH cirrhosis.  *  Hepatic encephalopathy. Confusion improved today but he remains somnolent. Still doesn't seem reliable to take medicines as prescribed. The lactulose is causing quite a bit of diarrhea, temporary flexiseal is in place.  *  Ascites.  No SBP detected on recent paracenteses 4. Not clear if he's been taking the diuretics as prescribed. Looks to have significant ascites currently.  *  History of esophageal varices and variceal bleeding. Band ligation 02/2013. On 12/2015 EGD. Minimal residual varices present, erosive gastritis, no obvious portal hypertensive gastropathy, incidental nonobstructing and asymptomatic peptic esophageal  stricture.Marland Kitchen   PLAN:     *  Patient deserves goals of care meeting between palliative care, himself and his sister. Given his decompensated cirrhosis, complications are bound to continue and it is best if he can make decisions when his thought process is clear.  *  If he does return home, have home health help patient Justin Scott set up pillboxes. Despite previous plans for home health to come out to his house, this never happened after recent discharges.  *  Monitor bmet to assure that he doesn't develop AKI.    *  Should we reorder another paracentesis? He is not that uncomfortable so maybe we just wait and let the diuretics due to her job. Try diuretics first   Justin Scott  01/31/2016, 2:13 PM Pager: [phone]     HPI: Justin Scott is a 65 y.o. male.  Pmh cryptogenic cirrhosis.  Esophageal varices and bleeding. Chronic cytopenias. Ascites. Anemia chronic disease. Chronic hyponatremia. DM type II. Prolonged QT interval.  EGD 02/2013: 3 trunks of small to medium sized distal esophagus varices. None had clear signs of recent bleeding.  However there was hematin in stomach was well as a small amount of fresh red blood at GE junction.  Moderate portal gastropathy throughout the stomach. Justin Scott placed 7 variceal ligating bands (one misfired) without immediate complication.  Patient never returned to Justin. Christella Scott for planned repeat EGD and colonoscopy. Labs  02/2013 with elevated IgA.  Normal TTG, ceruloplasmin, A-1AT, Ferritin.  Negative ANA, Hep B surface Ab,  Hepa A Ab, HCV.  Smooth muscle Ab 20 (norm is <20).  Mitochondrial Ab IgG 1.16 (norm <0.91).  Weights reviewed and showed weight changed from 179 pounds in 02/2013-237 pounds on 12/25/2015  Admission 9/5 - 12/28/15 with symptomatic anemia, Hgb 6.3, platelets 120, MCV 70, PT/INR 19.7/1.6.. He received 3 units of packed red blood cells. EGD performed showed erosive gastritis, incidental distal esophageal peptic stricture, trivial remnants of  esophageal varices. No proximal gastric varices.  Patient underwent ultrasound paracentesis on 12/25/15 removing 1.1 L. Justin. Marina Scott calculated the patient's meld score at 12. He recommended dosing of Aldactone 100 mg daily, Lasix 40 mg daily, 2 g sodium diet, Protonix 40 mg daily, FeSO4 325 mg twice a day On 10/2 daily called his PMD who instructed him to increase the Lasix to 40 mg twice a day but continue the same dose of Aldactone. Patient underwent another paracentesis 01/03/2016.  3.9 L were removed. On none of the paracentesis was there evidence of SPP. Patient admitted 10/3 - 10/4 due to dyspnea, increased ascites and lower extremity edema..  Patient is a poor historian and his sister reported that he was not taking his medication as prescribed. S/p 4 liter paracentesis 10/3.   Meds at discharge included Lasix 40 mg BID, Aldactone 100 mg BID, propanolol 10 mg 3 times a day. Plans were in place for home health to assist him with managing his medications. The patient lives alone.  On 01/30/16 Justin. Yousuf returned to Justin. Christella Scott for his first GI office visit since 03/2013. He was not doing well, he was confused, he couldn't verify which medicines he was taking. Justin. Christella Scott Scott him to the hospital for admission to the hospitalist service.  His sister feels that he is not doing well at home.  Patient endorses weakness (his biggest issue), lower extremity edema, abdominal swelling. He denies abdominal pain, nausea vomiting, anorexia, DOE.Marland Kitchen Has been having a daily bowel movement, no blood or dark/tarry stools.  Ammonia level yesterday was 119, it was 127 on 01/23/16. Sodium 133, consistent with prior measurements and stable. Ongoing renal insufficiency with decline in GFR to 37 from around 50 one week ago.Marland Kitchen His PT/INR at 22.3/1.93 show a slow but steady increase. H&H are actually pretty good at 12.4/38.4. Thrombocytopenia continues but is not critical. Glucose elevated at 208. Patient was prescribed 40 mg Lasix  BID, aldactone 100 mg BID per his previously outlined home regimen.  Though, to reiterate, it's not clear that he was actually taking these doses. Lactulose enema was administered once yesterday, since then he's been getting twice a day lactulose along with right rifaximin. He had not been on any encephalopathy medications prior to arrival.. He's having quite a lot of watery brown stool. A flexiseal has been placed to assist with patient hygiene. He still complains of feeling very tired. He is not confused however.   Past Medical History:  Diagnosis Date  . AKI (acute kidney injury) (HCC) 01/2016  . Anemia 02/2013   transfused PRBC x 3.  bled in setting of cirrhosis, esophageal varices, portal htn.   . Ascites   . Congestive heart failure (CHF) (HCC)   . GERD (gastroesophageal reflux disease)   . Hepatic cirrhosis (HCC)    [name] 12/25/2015  . Portal hypertension (HCC)    [name] 12/25/2015  . Thrombocytopenia (HCC) 01/2016  . Type II diabetes mellitus (HCC)   .  Upper GI bleeding 02/2013   November 2014 with upper GI bleeding requiring 3 units of blood/notes 12/25/2015    Past Surgical History:  Procedure Laterality Date  . CARDIAC CATHETERIZATION    . ESOPHAGOGASTRODUODENOSCOPY N/A 03/10/2013   3 trunks of small to medium sized distal esophagus varices. None had clear signs of recent bleeding however there was hematin in stomach was well as a small amount of fresh red blood at GE junction. There was moderate portal gastropathy throughout the stomach. Justin Justin HartiganJacobs placed 7 variceal ligating bands (one misfired) without immediate complication.  . ESOPHAGOGASTRODUODENOSCOPY N/A 12/27/2015   Procedure: ESOPHAGOGASTRODUODENOSCOPY (EGD);  Surgeon: Hilarie FredricksonJohn N Perry, MD;  [facility];  Service: Endoscopy;  Laterality: N/A;    Prior to Admission medications   Medication Sig Start Date End Date Taking? Authorizing Provider  ferrous sulfate 325 (65 FE) MG tablet Take 1 tablet (325 mg total) by  mouth 2 (two) times daily with a meal. 12/28/15  Yes Leroy SeaPrashant K Singh, MD  furosemide (LASIX) 40 MG tablet Take 1 tablet (40 mg total) by mouth 2 (two) times daily. 01/23/16  Yes Calvert CantorSaima Rizwan, MD  glimepiride (AMARYL) 4 MG tablet Take 8 mg by mouth daily with breakfast.    Yes Historical Provider, MD  pantoprazole (PROTONIX) 40 MG tablet Take 1 tablet (40 mg total) by mouth daily. 12/28/15  Yes Leroy SeaPrashant K Singh, MD  propranolol (INDERAL) 10 MG tablet Take 1 tablet (10 mg total) by mouth 3 (three) times daily. 12/28/15  Yes Leroy SeaPrashant K Singh, MD  spironolactone (ALDACTONE) 100 MG tablet Take 1 tablet (100 mg total) by mouth 2 (two) times daily. 01/23/16  Yes Calvert CantorSaima Rizwan, MD  bisacodyl (DULCOLAX) 5 MG EC tablet Take 1 tablet (5 mg total) by mouth daily as needed for moderate constipation. 12/28/15   Leroy SeaPrashant K Singh, MD    Scheduled Meds: . furosemide  40 mg Oral BID  . insulin aspart  0-5 Units Subcutaneous QHS  . insulin aspart  0-9 Units Subcutaneous TID WC  . lactulose  20 g Oral BID  . pantoprazole  40 mg Oral Daily  . propranolol  10 mg Oral TID  . rifaximin  550 mg Oral TID  . sodium chloride flush  3 mL Intravenous Q12H  . spironolactone  100 mg Oral BID   Infusions:   PRN Meds: sodium chloride, sodium chloride flush   Allergies as of 01/30/2016  . (No Known Allergies)    Family History  Problem Relation Age of Onset  . Diabetes Mother   . Heart disease Father   . Diabetes Father     Social History   Social History  . Marital status: Married    Spouse name: N/A  . Number of children: 0  . Years of education: N/A   Occupational History  . custodian [employer]   Social History Main Topics  . Smoking status: Never Smoker  . Smokeless tobacco: Former NeurosurgeonUser     Comment: 01/30/2016 "quit chewing in the late 1990s"  . Alcohol use No  . Drug use: No  . Sexual activity: Not Currently   Other Topics Concern  . Not on file   Social History Narrative  .  No narrative on file    REVIEW OF SYSTEMS: Constitutional:  Per HPI.  Weakness.   ENT:  No nose bleeds Pulm:  No SOB or cough CV:  No palpitations, no LE edema. No chest pain. GU:  No hematuria, no frequency GI:  Per HPI Heme:  No unusual bleeding or bruising.   Transfusions:  Per HPI.  12/2015 was his last transfusion Neuro:  No headaches, no peripheral tingling or numbness Derm:  No itching, no rash or sores.  Endocrine:  Often feels chilly. No sweats.  No polyuria or dysuria Immunization:  Did not inquire. Travel:  None beyond local counties in last few months.    PHYSICAL EXAM: Vital signs in last 24 hours: Vitals:   01/31/16 0754 01/31/16 1059  BP: 112/67 108/63  Pulse: 79 84  Resp: 18   Temp: 98.1 F (36.7 C)    Wt Readings from Last 3 Encounters:  01/30/16 82.2 kg (181 lb 4.8 oz)  01/30/16 88.1 kg (194 lb 4 oz)  01/22/16 92.1 kg (203 lb)    General: Obese, pleasant, somnolent but arousable. Comfortable. Head:  No facial asymmetry or swelling.  Eyes:  No conjunctival pallor, no scleral icterus. EOMI. Ears:  No obvious hearing deficit.  Nose:  No discharge or congestion Mouth:  Tongue is midline. Mucous membranes are moist and clear. Neck:  No JVD, masses or thyromegaly. Lungs:  Somewhat diminished breath sounds, probably due to poor inspiratory effort. No adventitious sounds. No cough and no dyspnea. Heart: RRR. No MRG. S1, S2 present. Abdomen:  Obese. Bowel sounds active. Protuberant but soft. NT. ND. No masses or organomegaly appreciated..   Rectal: The flex E seal in place. There is watery light brown, non-bloody stool in the flex E seal bag.   Musc/Skeltl: No joint erythema or swelling. Extremities:  Nonpitting lower extremity edema.  Neurologic:  Patient is a little bit slow to respond to questions but is accurate. He follows commands. No asterixis or tremor. Moves all 4 limbs but strength was not tested. Skin:  Some woody skin changes in the lower legs.  Minimal, nonpitting edema. Psych:  Affect blunted.  Intake/Output from previous day: 10/11 0701 - 10/12 0700 In: 123 [P.O.:120; I.V.:3] Out: 425 [Urine:425] Intake/Output this shift: Total I/O In: 720 [P.O.:720] Out: 650 [Urine:450; Stool:200]  LAB RESULTS:  Recent Labs  01/29/16 1040 01/30/16 1525 01/31/16 0500  WBC 9.8 9.0 9.6  HGB 13.5 12.4* 12.3*  HCT 43.3 38.4* 38.3*  PLT 160 130* 127*   BMET Lab Results  Component Value Date   NA 134 (L) 01/31/2016   NA 133 (L) 01/30/2016   NA 132 (L) 01/29/2016   K 4.2 01/31/2016   K 4.6 01/30/2016   K 4.7 01/29/2016   CL 102 01/31/2016   CL 99 (L) 01/30/2016   CL 99 (L) 01/29/2016   CO2 24 01/31/2016   CO2 25 01/30/2016   CO2 22 01/29/2016   GLUCOSE 66 01/31/2016   GLUCOSE 168 (H) 01/30/2016   GLUCOSE 208 (H) 01/29/2016   BUN 37 (H) 01/31/2016   BUN 41 (H) 01/30/2016   BUN 44 (H) 01/29/2016   CREATININE 1.45 (H) 01/31/2016   CREATININE 1.77 (H) 01/30/2016   CREATININE 1.86 (H) 01/29/2016   CALCIUM 8.7 (L) 01/31/2016   CALCIUM 8.6 (L) 01/30/2016   CALCIUM 9.1 01/29/2016   LFT  Recent Labs  01/29/16 1040 01/30/16 1525 01/31/16 0500  PROT 8.2* 6.9 6.9  ALBUMIN 2.5* 2.0* 1.9*  AST 31 31 29   ALT 16* 17 16*  ALKPHOS 91 84 77  BILITOT 1.7* 1.2 1.4*   PT/INR Lab Results  Component Value Date   INR 1.93 01/30/2016   INR 1.69 01/29/2016   INR 1.72 01/23/2016   Hepatitis Panel No results for  input(s): HEPBSAG, HCVAB, HEPAIGM, HEPBIGM in the last 72 hours. C-Diff No components found for: CDIFF Lipase     Component Value Date/Time   LIPASE 75 (H) 01/30/2016 1525    Drugs of Abuse  No results found for: LABOPIA, COCAINSCRNUR, LABBENZ, AMPHETMU, THCU, LABBARB   RADIOLOGY STUDIES: Dg Abd 1 View  Result Date: 01/30/2016 CLINICAL DATA:  Possible abdominal ascites, history of recent paracentesis EXAM: ABDOMEN - 1 VIEW COMPARISON:  None. FINDINGS: Scattered large and small bowel gas is noted. No abnormal  mass or abnormal calcifications are noted. No findings to suggest recurrent ascites are seen. No free air is noted. Degenerative change of the lumbar spine is seen. IMPRESSION: No acute abnormality noted. Electronically Signed   By: Alcide Clever M.D.   On: 01/30/2016 18:12   US Paracentesis  Result Date: 01/30/2016 CLINICAL DATA:  Cirrhosis.  Recurrent abdominal ascites. EXAM: ULTRASOUND GUIDED PARACENTESIS TECHNIQUE: The procedure, risks (including but not limited to bleeding, infection, organ damage ), benefits, and alternatives were explained to the patient. Questions regarding the procedure were encouraged and answered. The patient understands and consents to the procedure. Survey ultrasound of the abdomen was performed and an appropriate skin entry site in the right lower abdomen was selected. Skin site was marked, prepped with chlorhexidine, and draped in usual sterile fashion, and infiltrated locally with 1% lidocaine. A Safe-T-Centesis sheath needle was advanced into the peritoneal space until fluid could be aspirated. The sheath was advanced and the needle removed. 5 L of cloudy yellowascites were aspirated. COMPLICATIONS: COMPLICATIONS none IMPRESSION: Technically successful ultrasound guided paracentesis, removing 5 L of chylous ascites. Electronically Signed   By: Corlis Leak M.D.   On: 01/30/2016 09:26

## 2016-01-31 NOTE — Progress Notes (Signed)
Pt says he can not breathe. HR is 83, no visible signs of distress, R is 18, SpO2- 99 on room air. MD notified.

## 2016-02-01 DIAGNOSIS — E871 Hypo-osmolality and hyponatremia: Secondary | ICD-10-CM | POA: Diagnosis present

## 2016-02-01 DIAGNOSIS — R188 Other ascites: Secondary | ICD-10-CM | POA: Diagnosis present

## 2016-02-01 DIAGNOSIS — K7469 Other cirrhosis of liver: Secondary | ICD-10-CM | POA: Diagnosis present

## 2016-02-01 DIAGNOSIS — Z515 Encounter for palliative care: Secondary | ICD-10-CM

## 2016-02-01 DIAGNOSIS — E1122 Type 2 diabetes mellitus with diabetic chronic kidney disease: Secondary | ICD-10-CM | POA: Diagnosis present

## 2016-02-01 DIAGNOSIS — Z833 Family history of diabetes mellitus: Secondary | ICD-10-CM | POA: Diagnosis not present

## 2016-02-01 DIAGNOSIS — Z9119 Patient's noncompliance with other medical treatment and regimen: Secondary | ICD-10-CM | POA: Diagnosis not present

## 2016-02-01 DIAGNOSIS — I959 Hypotension, unspecified: Secondary | ICD-10-CM | POA: Diagnosis not present

## 2016-02-01 DIAGNOSIS — Z8249 Family history of ischemic heart disease and other diseases of the circulatory system: Secondary | ICD-10-CM | POA: Diagnosis not present

## 2016-02-01 DIAGNOSIS — G9340 Encephalopathy, unspecified: Secondary | ICD-10-CM

## 2016-02-01 DIAGNOSIS — K7290 Hepatic failure, unspecified without coma: Secondary | ICD-10-CM | POA: Diagnosis present

## 2016-02-01 DIAGNOSIS — N182 Chronic kidney disease, stage 2 (mild): Secondary | ICD-10-CM | POA: Diagnosis present

## 2016-02-01 DIAGNOSIS — K219 Gastro-esophageal reflux disease without esophagitis: Secondary | ICD-10-CM | POA: Diagnosis present

## 2016-02-01 DIAGNOSIS — G9341 Metabolic encephalopathy: Secondary | ICD-10-CM | POA: Diagnosis present

## 2016-02-01 DIAGNOSIS — N179 Acute kidney failure, unspecified: Secondary | ICD-10-CM | POA: Diagnosis not present

## 2016-02-01 DIAGNOSIS — Z66 Do not resuscitate: Secondary | ICD-10-CM | POA: Diagnosis not present

## 2016-02-01 DIAGNOSIS — K766 Portal hypertension: Secondary | ICD-10-CM | POA: Diagnosis present

## 2016-02-01 DIAGNOSIS — I8510 Secondary esophageal varices without bleeding: Secondary | ICD-10-CM | POA: Diagnosis present

## 2016-02-01 DIAGNOSIS — E875 Hyperkalemia: Secondary | ICD-10-CM | POA: Diagnosis not present

## 2016-02-01 DIAGNOSIS — E46 Unspecified protein-calorie malnutrition: Secondary | ICD-10-CM | POA: Diagnosis present

## 2016-02-01 DIAGNOSIS — D696 Thrombocytopenia, unspecified: Secondary | ICD-10-CM | POA: Diagnosis present

## 2016-02-01 DIAGNOSIS — K7581 Nonalcoholic steatohepatitis (NASH): Secondary | ICD-10-CM | POA: Diagnosis present

## 2016-02-01 DIAGNOSIS — K7682 Hepatic encephalopathy: Secondary | ICD-10-CM | POA: Diagnosis present

## 2016-02-01 DIAGNOSIS — K7460 Unspecified cirrhosis of liver: Secondary | ICD-10-CM | POA: Diagnosis not present

## 2016-02-01 LAB — CBC WITH DIFFERENTIAL/PLATELET
BASOS ABS: 0 10*3/uL (ref 0.0–0.1)
Basophils Relative: 0 %
EOS PCT: 1 %
Eosinophils Absolute: 0.1 10*3/uL (ref 0.0–0.7)
HEMATOCRIT: 39.7 % (ref 39.0–52.0)
HEMOGLOBIN: 12.7 g/dL — AB (ref 13.0–17.0)
LYMPHS PCT: 9 %
Lymphs Abs: 1.1 10*3/uL (ref 0.7–4.0)
MCH: 26.1 pg (ref 26.0–34.0)
MCHC: 32 g/dL (ref 30.0–36.0)
MCV: 81.5 fL (ref 78.0–100.0)
MONOS PCT: 14 %
Monocytes Absolute: 1.8 10*3/uL — ABNORMAL HIGH (ref 0.1–1.0)
Neutro Abs: 9.6 10*3/uL — ABNORMAL HIGH (ref 1.7–7.7)
Neutrophils Relative %: 76 %
Platelets: 131 10*3/uL — ABNORMAL LOW (ref 150–400)
RBC: 4.87 MIL/uL (ref 4.22–5.81)
WBC: 12.6 10*3/uL — ABNORMAL HIGH (ref 4.0–10.5)

## 2016-02-01 LAB — COMPREHENSIVE METABOLIC PANEL
ALBUMIN: 2 g/dL — AB (ref 3.5–5.0)
ALT: 17 U/L (ref 17–63)
AST: 36 U/L (ref 15–41)
Alkaline Phosphatase: 87 U/L (ref 38–126)
Anion gap: 9 (ref 5–15)
BILIRUBIN TOTAL: 1.3 mg/dL — AB (ref 0.3–1.2)
BUN: 34 mg/dL — AB (ref 6–20)
CO2: 24 mmol/L (ref 22–32)
CREATININE: 1.54 mg/dL — AB (ref 0.61–1.24)
Calcium: 8.8 mg/dL — ABNORMAL LOW (ref 8.9–10.3)
Chloride: 100 mmol/L — ABNORMAL LOW (ref 101–111)
GFR calc Af Amer: 54 mL/min — ABNORMAL LOW (ref >60)
GFR calc non Af Amer: 47 mL/min — ABNORMAL LOW (ref >60)
GLUCOSE: 139 mg/dL — AB (ref 65–99)
POTASSIUM: 4.7 mmol/L (ref 3.5–5.1)
Sodium: 133 mmol/L — ABNORMAL LOW (ref 135–145)
TOTAL PROTEIN: 6.6 g/dL (ref 6.5–8.1)

## 2016-02-01 LAB — GLUCOSE, CAPILLARY
GLUCOSE-CAPILLARY: 205 mg/dL — AB (ref 65–99)
GLUCOSE-CAPILLARY: 195 mg/dL — AB (ref 65–99)
Glucose-Capillary: 147 mg/dL — ABNORMAL HIGH (ref 65–99)
Glucose-Capillary: 114 mg/dL — ABNORMAL HIGH (ref 65–99)
Glucose-Capillary: 186 mg/dL — ABNORMAL HIGH (ref 65–99)

## 2016-02-01 LAB — MAGNESIUM: Magnesium: 2.1 mg/dL (ref 1.7–2.4)

## 2016-02-01 MED ORDER — RIFAXIMIN 550 MG PO TABS
550.0000 mg | ORAL_TABLET | Freq: Two times a day (BID) | ORAL | Status: DC
  Administered 2016-02-01 – 2016-02-07 (×12): 550 mg via ORAL
  Filled 2016-02-01 (×12): qty 1

## 2016-02-01 NOTE — Clinical Social Work Note (Signed)
Clinical Social Work Assessment  Patient Details  Name: Justin Scott MRN: [id number] Date of Birth: 04/03/1951  Date of referral:  02/01/16               Reason for consult:  Facility Placement                Permission sought to share information with:  Facility Medical sales representativeContact Representative, Family Supports Permission granted to share information::  Yes, Verbal Permission Granted  Name::     Retail buyerBrenda  Agency::  SNFs  Relationship::  Sister  Contact Information:  [phone]  Housing/Transportation Living arrangements for the past 2 months:  Mobile Home Source of Information:  Patient, Other (Comment Required) (Sister) Patient Interpreter Needed:  None Criminal Activity/Legal Involvement Pertinent to Current Situation/Hospitalization:  No - Comment as needed Significant Relationships:  Siblings Lives with:  Self Do you feel safe going back to the place where you live?  No Need for family participation in patient care:  Yes (Comment)  Care giving concerns:  CSW received consult for possible SNF placement at time of discharge. CSW spoke with patient's sister due to patient's confusion, regarding PT recommendation of SNF placement at time of discharge. Per patient's sister, [facility] and patient lives alone. Patient's sister expressed understanding of PT recommendation and is agreeable to SNF placement at time of discharge. CSW to continue to follow and assist with discharge planning needs.   Social Worker assessment / plan:  CSW spoke with patient's sister concerning [facility] before returning home.   Employment status:  Other (Comment) Insurance information:  Managed Care PT Recommendations:  Skilled Nursing Facility Information / Referral to community resources:  Skilled Nursing Facility  Patient/Family's Response to care:  Patient and patient's sister recognize need for rehab before returning home and [place] [facility] since patient's wife is there. Patient's insurance advised that currently patient is not engaging enough with physical therapy for them to authorize ST rehab. CSW asked RN to put in PT consult to see if patient will work with them.  Patient/Family's Understanding of and Emotional Response to Diagnosis, Current Treatment, and Prognosis:  Patient/family is realistic regarding therapy needs and expressed being hopeful for SNF placement. Patient's sister expressed understanding of CSW role and discharge process. No questions/concerns about plan or treatment.    Emotional Assessment Appearance:  Appears stated age Attitude/Demeanor/Rapport:  Other (Appropriate; at times confused) Affect (typically observed):  Appropriate Orientation:  Oriented to Self, Oriented to Situation, Oriented to Place, Oriented to  Time (Sometimes confused) Alcohol / Substance use:  Not Applicable Psych involvement (Current and /or in the community):  No (Comment)  Discharge Needs  Concerns to be addressed:  Care Coordination Readmission within the last 30 days:  No Current discharge risk:  None Barriers to Discharge:  Continued Medical Work up   [employer] S Clem Wisenbaker, LCSWA 02/01/2016, 4:03 PM

## 2016-02-01 NOTE — Progress Notes (Signed)
[facility] Progress Note  Patient: Justin Scott ZOX:[id number]   PCP: Kaleen MaskELKINS,WILSON OLIVER, MD DOB: 1950-06-22   DOA: 01/30/2016   DOS: 02/01/2016   Date of Service: the patient was seen and examined on 02/01/2016  Brief hospital course: Pt. with PMH of Cryptogenic cirrhosis, portal hypertension, hepatic encephalopathy; admitted on 01/30/2016, with complaint of confusion, was found to have mild worsening of hepatic encephalopathy. Currently further plan is continue management, consult palliative care for goals of care discussion.  Assessment and Plan: 1. Acute encephalopathy Significant improvement and appears to be baseline. No asterixis on examination. We will recheck ammonia level in the morning. Continue rifaximin, increase the dose per pharmacy.  2. Cryptogenic liver cirrhosis. Appreciate GI input. With high MELD score GI recommends to discussed with palliative care provider to decide goals of care. Consulted palliative care. Continue to monitor.  3. Acute kidney injury. Mild elevation of serum creatinine. We'll continue to monitor.  4. GERD. Continue PPI.  5. Anemia, normocytic anemia. Secondary to cirrhosis. We'll continue to monitor.  6. Low TSH. Recheck free T4 and T3.  Pain management: When necessary Tylenol Activity: Consulted physical therapy Bowel regimen: last BM 02/01/2016 Diet: Low-salt diet DVT Prophylaxis: mechanical compression device.  Advance goals of care discussion: Full code  Family Communication: no family was present at bedside, at the time of interview.    Disposition:  Discharge to SNF. Expected discharge date: 02/01/2016  Consultants: Gastroenterology Procedures: none  Antibiotics: Anti-infectives    Start     Dose/Rate Route Frequency Ordered Stop   02/01/16 2200  rifaximin (XIFAXAN) tablet 550 mg     550 mg Oral 2 times daily 02/01/16 1454     01/31/16 1600  rifaximin (XIFAXAN) tablet 550 mg  Status:  Discontinued       550 mg Oral 3 times daily 01/31/16 1230 02/01/16 1454   01/30/16 2200  rifaximin (XIFAXAN) tablet 200 mg  Status:  Discontinued     200 mg Oral 3 times daily 01/30/16 1829 01/31/16 1230     Subjective: Appears somewhat lethargic and confused. Denies any acute complaint. Overnight he did have a complaint about difficulty breathing but did not have any complaint at the time of my evaluation.  Objective: Physical Exam: Vitals:   02/01/16 0700 02/01/16 0859 02/01/16 1300 02/01/16 1657  BP:  115/66 120/64 100/65  Pulse:  84 80 79  Resp:   16   Temp:   98.6 F (37 C)   TempSrc:   Oral   SpO2:   98%   Weight: 74.2 kg (163 lb 9.6 oz)     Height:        Intake/Output Summary (Last 24 hours) at 02/01/16 1955 Last data filed at 02/01/16 1645  Gross per 24 hour  Intake              120 ml  Output             1450 ml  Net            -1330 ml   Filed Weights   01/30/16 1355 02/01/16 0700  Weight: 82.2 kg (181 lb 4.8 oz) 74.2 kg (163 lb 9.6 oz)    General: Alert, Awake and Oriented to Time, Place and Person. Appear in mild distress, affect appropriate Eyes: PERRL, Conjunctiva normal ENT: Oral Mucosa clear moist. Neck: difficult to assess JVD, no Abnormal Mass Or lumps Cardiovascular: S1 and S2 Present, no Murmur, Respiratory: Bilateral Air entry equal and Decreased, no  use of accessory muscle, Clear to Auscultation, no Crackles, no wheezes Abdomen: Bowel Sound present, Soft and distended, no tenderness Skin: no redness, no Rash, no induration Extremities: no Pedal edema, no calf tenderness Neurologic: Grossly no focal neuro deficit. Bilaterally Equal motor strength No asterixis  Data Reviewed: CBC:  Recent Labs Lab 01/29/16 1040 01/30/16 1525 01/31/16 0500 02/01/16 0536  WBC 9.8 9.0 9.6 12.6*  NEUTROABS  --  7.0  --  9.6*  HGB 13.5 12.4* 12.3* 12.7*  HCT 43.3 38.4* 38.3* 39.7  MCV 82.3 80.8 81.3 81.5  PLT 160 130* 127* 131*   Basic Metabolic Panel:  Recent  Labs Lab 01/29/16 1040 01/30/16 1525 01/31/16 0500 02/01/16 0536  NA 132* 133* 134* 133*  K 4.7 4.6 4.2 4.7  CL 99* 99* 102 100*  CO2 22 25 24 24   GLUCOSE 208* 168* 66 139*  BUN 44* 41* 37* 34*  CREATININE 1.86* 1.77* 1.45* 1.54*  CALCIUM 9.1 8.6* 8.7* 8.8*  MG  --  2.2  --  2.1  PHOS  --  3.8  --   --     Liver Function Tests:  Recent Labs Lab 01/29/16 1040 01/30/16 1525 01/31/16 0500 02/01/16 0536  AST 31 31 29  36  ALT 16* 17 16* 17  ALKPHOS 91 84 77 87  BILITOT 1.7* 1.2 1.4* 1.3*  PROT 8.2* 6.9 6.9 6.6  ALBUMIN 2.5* 2.0* 1.9* 2.0*    Recent Labs Lab 01/30/16 1525  LIPASE 75*    Recent Labs Lab 01/30/16 1525  AMMONIA 119*   Coagulation Profile:  Recent Labs Lab 01/29/16 1330 01/30/16 1525  INR 1.69 1.93   Cardiac Enzymes: No results for input(s): CKTOTAL, CKMB, CKMBINDEX, TROPONINI in the last 168 hours. BNP (last 3 results) No results for input(s): PROBNP in the last 8760 hours.  CBG:  Recent Labs Lab 01/31/16 1658 01/31/16 2134 02/01/16 0753 02/01/16 1226 02/01/16 1640  GLUCAP 147* 149* 114* 205* 195*   Studies: No results found.   Scheduled Meds: . furosemide  40 mg Oral BID  . insulin aspart  0-5 Units Subcutaneous QHS  . insulin aspart  0-9 Units Subcutaneous TID WC  . lactulose  20 g Oral BID  . pantoprazole  40 mg Oral Daily  . propranolol  10 mg Oral TID  . rifaximin  550 mg Oral BID  . sodium chloride flush  3 mL Intravenous Q12H  . spironolactone  100 mg Oral BID   Continuous Infusions:  PRN Meds: sodium chloride, sodium chloride flush  Time spent: 30 minutes  Author: Lynden Oxford, MD [facility] Pager: [phone] 02/01/2016 7:55 PM  If 7PM-7AM, please contact night-coverage at [email or web address], password [facility]

## 2016-02-01 NOTE — Progress Notes (Signed)
Daily Rounding Note  02/01/2016, 9:08 AM  LOS: 0 days   SUBJECTIVE:   Chief complaint: weakness   Continues to feel weak and sleep a lot.  No overt confusion.  Watery stools continue.  Is eating and tolerating solid food, intake percentage variable. Measured urinary output yesterday of 800 mL, 450 mL so far today. Patient is using a urinal.  OBJECTIVE:         Vital signs in last 24 hours:    Temp:  [98 F (36.7 C)-98.3 F (36.8 C)] 98.3 F (36.8 C) (10/13 0554) Pulse Rate:  [81-87] 84 (10/13 0859) Resp:  [16-19] 18 (10/13 0554) BP: (97-115)/(58-68) 115/66 (10/13 0859) SpO2:  [97 %-100 %] 99 % (10/13 0554) Weight:  [74.2 kg (163 lb 9.6 oz)] 74.2 kg (163 lb 9.6 oz) (10/13 0700) Last BM Date: 01/30/16 Filed Weights   01/30/16 1355 02/01/16 0700  Weight: 82.2 kg (181 lb 4.8 oz) 74.2 kg (163 lb 9.6 oz)   General: Somnolent but able to awaken patient. Looks chronically ill. Comfortable.   Heart: RRR. Chest: Clear bilaterally. No labored breathing. No cough Abdomen: Soft but markedly protuberant and large. NT. Active, normal type, bowel sounds.  Light/medium brown watery p.m. in the flex ECL back. Extremities: 2-3+ pedal, lower extremity edema. Neuro/Psych:  Pleasant, cooperative. Affect dull.  Oriented 3. Slow response time but follows commands. No asterixis or tremor.  Intake/Output from previous day: 10/12 0701 - 10/13 0700 In: 960 [P.O.:960] Out: 1000 [Urine:800; Stool:200]   Lab Results:  Recent Labs  01/30/16 1525 01/31/16 0500 02/01/16 0536  WBC 9.0 9.6 12.6*  HGB 12.4* 12.3* 12.7*  HCT 38.4* 38.3* 39.7  PLT 130* 127* 131*   BMET  Recent Labs  01/30/16 1525 01/31/16 0500 02/01/16 0536  NA 133* 134* 133*  K 4.6 4.2 4.7  CL 99* 102 100*  CO2 25 24 24   GLUCOSE 168* 66 139*  BUN 41* 37* 34*  CREATININE 1.77* 1.45* 1.54*  CALCIUM 8.6* 8.7* 8.8*   LFT  Recent Labs  01/30/16 1525  01/31/16 0500 02/01/16 0536  PROT 6.9 6.9 6.6  ALBUMIN 2.0* 1.9* 2.0*  AST 31 29 36  ALT 17 16* 17  ALKPHOS 84 77 87  BILITOT 1.2 1.4* 1.3*   PT/INR  Recent Labs  01/29/16 1330 01/30/16 1525  LABPROT 20.1* 22.3*  INR 1.69 1.93     ASSESMENT:   *  Decompensated NASH cirrhosis.  *  Hepatic encephalopathy. The lactulose is causing quite a bit of diarrhea, temporary flexiseal is in place.  *  Ascites.  No SBP detected on recent paracenteses 4. Not clear if he's been taking the diuretics as prescribed. Looks to have significant ascites currently.  Medical mgt with appropriated doses of diuretics for now.  If refractory ascites or pt too uncomfortable: consider repeat tap.   Mild CKD but overall stable BUN/creat.    *  History of esophageal varices and variceal bleeding. Band ligation 02/2013. On 12/2015 EGD. Minimal residual varices present, erosive gastritis, no obvious portal hypertensive gastropathy, incidental nonobstructing and asymptomatic peptic esophageal stricture..   *  Coagulopathy.   *  Protein malnutrition.    PLAN   *  Daily bmet and standing weights.  2gm Na diet.  Ammonia, pre albumin in AM.    *  Palliative care consult for goals of care is pending.    Jennye MoccasinSarah Gribbin  02/01/2016, 9:08 AM Pager: [phone]  Agree  with Ms. Heron Nay assessment and plan. [name], MD, Clement[facility] Graham

## 2016-02-01 NOTE — Evaluation (Signed)
Physical Therapy Evaluation Patient Details Name: Justin Scott MRN: [id number] DOB: 08-10-1950 Today's Date: 02/01/2016   History of Present Illness  Justin Scott is a 65 y.o. male with acute encephalopathy and decompensated NASH cirrhosis, AKI and malnourished, ascites with recent tap.   Past Medical History of cirrhosis, DM, anemia, anasarca,   Clinical Impression  Pt is up to chair when PT arrives and is quite weak, has agreed to sit up in chair for nursing for 2 hours but quite tired after the first hour.  He is expecting to get home alone eventually as his wife is in LTC and he will not have much help.  Will follow acutely to see what ground can be made up for this gentleman as he was working prior to his hospitalization.  Will focus on strength, gait and balance as tolerated.    Follow Up Recommendations SNF    Equipment Recommendations  None recommended by PT    Recommendations for Other Services Rehab consult     Precautions / Restrictions Precautions Precautions: Fall (telemetry) Precaution Comments: no recent falls per pt Restrictions Weight Bearing Restrictions: No      Mobility  Bed Mobility               General bed mobility comments: up in chair with nursing when PT arrived  Transfers                 General transfer comment: refused to stand  Ambulation/Gait                Stairs            Wheelchair Mobility    Modified Rankin (Stroke Patients Only)       Balance Overall balance assessment: Needs assistance Sitting-balance support:  (trunk supported) Sitting balance-Leahy Scale: Poor                                       Pertinent Vitals/Pain Pain Assessment: No/denies pain    Home Living Family/patient expects to be discharged to:: Private residence Living Arrangements: Alone Available Help at Discharge: Family Type of Home: Mobile home Home Access: Stairs to enter Entrance Stairs-Rails:  Right;Left;Can reach both Entrance Stairs-Number of Steps: 5 Home Layout: One level Home Equipment: None      Prior Function Level of Independence: Independent               Hand Dominance        Extremity/Trunk Assessment   Upper Extremity Assessment: Generalized weakness           Lower Extremity Assessment: Generalized weakness      Cervical / Trunk Assessment: Normal  Communication   Communication: HOH  Cognition Arousal/Alertness: Lethargic Behavior During Therapy: Flat affect Overall Cognitive Status: No family/caregiver present to determine baseline cognitive functioning       Memory: Decreased short-term memory;Decreased recall of precautions              General Comments General comments (skin integrity, edema, etc.): Pt is quite weak today, low energy and by getting up to chair with nursing had very much fatigued himself and could not tolerate more standing    Exercises Other Exercises Other Exercises: L knee 4+ ext, flex 4- and DF 4+. Other Exercises: DF B 0 deg, hips in flexion posture   Assessment/Plan    PT Assessment Patient needs continued PT  services  PT Problem List Decreased strength;Decreased range of motion;Decreased activity tolerance;Decreased balance;Decreased mobility;Decreased safety awareness;Decreased knowledge of use of DME;Cardiopulmonary status limiting activity          PT Treatment Interventions DME instruction;Gait training;Functional mobility training;Stair training;Therapeutic activities;Therapeutic exercise;Balance training;Neuromuscular re-education;Patient/family education    PT Goals (Current goals can be found in the Care Plan section)  Acute Rehab PT Goals Patient Stated Goal: none stated PT Goal Formulation: With patient Time For Goal Achievement: 02/15/16 Potential to Achieve Goals: Good    Frequency Min 3X/week   Barriers to discharge Decreased caregiver support;Inaccessible home environment  (stairs to enter)      Co-evaluation               End of Session   Activity Tolerance: Patient limited by fatigue;Patient limited by lethargy;Other (comment) (exhausted from being up to chair) Patient left: in chair;with call bell/phone within reach;with chair alarm set Nurse Communication: Mobility status    Functional Assessment Tool Used: clinical judgment Functional Limitation: Mobility: Walking and moving around Mobility: Walking and Moving Around Current Status ([id number](G8978): At least 40 percent but less than 60 percent impaired, limited or restricted Mobility: Walking and Moving Around Goal Status [id number](G8979): At least 1 percent but less than 20 percent impaired, limited or restricted    Time: 1107-1120 PT Time Calculation (min) (ACUTE ONLY): 13 min   Charges:   PT Evaluation $PT Eval Low Complexity: 1 Procedure     PT G Codes:   PT G-Codes **NOT FOR INPATIENT CLASS** Functional Assessment Tool Used: clinical judgment Functional Limitation: Mobility: Walking and moving around Mobility: Walking and Moving Around Current Status ([phone](G8978): At least 40 percent but less than 60 percent impaired, limited or restricted Mobility: Walking and Moving Around Goal Status [phone]): At least 1 percent but less than 20 percent impaired, limited or restricted    [name], Journie Howson E 02/01/2016, 11:56 AM    Samul Dadauth Everline Mahaffy, PT MS Acute Rehab Dept. Number: [facility] [phone] and [facility] [phone]

## 2016-02-01 NOTE — Care Management Note (Signed)
Case Management Note  Patient Details  Name: Justin Scott MRN: [id number] Date of Birth: June 15, 1950  Subjective/Objective:                  Admitted with AMS, past medical history of , Esophageal varices, cirrhosis, recurrent ascites and lower extremity edema, chronic hyponatremia, diabetes, prolonged QT interval, congestive heart failure, GERD, portal hypertension.   Action/Plan: Palliative following to establish goc. Per [name] recommendation: SNF, CSW aware and following.   Expected Discharge Date:                  Expected Discharge Plan:  Skilled Nursing Facility  In-House Referral:  Clinical Social Work  Discharge planning Services  CM Consult  Post Acute Care Choice:    Choice offered to:     DME Arranged:    DME Agency:     HH Arranged:    HH Agency:     Status of Service:  In process, will continue to follow  If discussed at Long Length of Stay Meetings, dates discussed:    Additional Comments:  Epifanio LeschesCole, Randye Treichler Hudson, RN 02/01/2016, 3:53 PM

## 2016-02-01 NOTE — Consult Note (Signed)
Consultation Note Date: 02/01/2016   Patient Name: Justin Scott  DOB: Oct 11, 1950  MRN: [id number]  Age / Sex: 65 y.o., male  PCP: Kaleen Mask, MD Referring Physician: Rolly Salter, MD  Reason for Consultation: Establishing goals of care and Psychosocial/spiritual support  HPI/Patient Profile: 65 y.o. male  with past medical history of , Esophageal varices, cirrhosis, recurrent ascites and lower extremity edema, chronic hyponatremia, diabetes, prolonged QT interval, congestive heart failure, GERD, portal hypertension admitted on 01/30/2016 with altered mental status. Patient had an ammonia level of 119 on admission..   Clinical Assessment and Goals of Care: Patient is somnolent but does awaken to voice. He is confused. He seems to be having a hard time staying awake. He does not seem to understand why he is in the hospital.  Emergency contact is listed as his sister Pati Gallo at [phone] . Patient tells me that he is married and that his spouse lives in a "rest home". He denies having any children    SUMMARY OF RECOMMENDATIONS   Patient still currently confused, encephalopathic and unable to fully participate in goals of care discussion Have left a message for his sister Pati Gallo  Code Status/Advance Care Planning:  Full code    Symptom Management:   Encephalopathy: Continue lactulose as needed  Palliative Prophylaxis:   Aspiration, Delirium Protocol, Eye Care, Frequent Pain Assessment, Oral Care and Turn Reposition  Additional Recommendations (Limitations, Scope, Preferences):  Full Scope Treatment  Psycho-social/Spiritual:   Desire for further Chaplaincy support:no  Additional Recommendations: Referral to Community Resources   Prognosis:   < 6 months in setting of worsening Elita Boone with comorbidities of congestive heart failure diabetes. He has a meld score of 22  which holds a 3 month mortality of approximately 20%. Patient has had very elevated ammonia levels on his admission to the hospital. On 10 for his ammonia level was 127; on 01/30/2016 it was 119. He is undergone paracentesis 45 L on 01/29/2016. He has an albumin level of 2.0  Discharge Planning: To Be Determined      Primary Diagnoses: Present on Admission: . Thrombocytopenia (HCC) . Portal hypertension (HCC) . Hyponatremia . Hepatic cirrhosis (HCC) . Cirrhosis of liver with ascites (HCC) . Acute kidney injury (HCC)   I have reviewed the medical record, interviewed the patient and family, and examined the patient. The following aspects are pertinent.  Past Medical History:  Diagnosis Date  . AKI (acute kidney injury) (HCC) 01/2016  . Anemia 02/2013   transfused PRBC x 3.  bled in setting of cirrhosis, esophageal varices, portal htn.   . Ascites   . Congestive heart failure (CHF) (HCC)   . GERD (gastroesophageal reflux disease)   . Hepatic cirrhosis (HCC)    [name] 12/25/2015  . Portal hypertension (HCC)    [name] 12/25/2015  . Thrombocytopenia (HCC) 01/2016  . Type II diabetes mellitus (HCC)   . Upper GI bleeding 02/2013   November 2014 with upper GI bleeding requiring 3 units of blood/notes 12/25/2015  Social History   Social History  . Marital status: Married    Spouse name: N/A  . Number of children: 0  . 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 Smoking status: Never Smoker  . Smokeless tobacco: Former Neurosurgeon     Comment: 01/30/2016 "quit chewing in the late 1990s"  . Alcohol use No  . Drug use: No  . Sexual activity: Not Currently   Other Topics Concern  . None   Social History Narrative  . None   Family History  Problem Relation Age of Onset  . Diabetes Mother   . Heart disease Father   . Diabetes Father    Scheduled Meds: . furosemide  40 mg Oral BID  . insulin aspart   0-5 Units Subcutaneous QHS  . insulin aspart  0-9 Units Subcutaneous TID WC  . lactulose  20 g Oral BID  . pantoprazole  40 mg Oral Daily  . propranolol  10 mg Oral TID  . rifaximin  550 mg Oral TID  . sodium chloride flush  3 mL Intravenous Q12H  . spironolactone  100 mg Oral BID   Continuous Infusions:  PRN Meds:.sodium chloride, sodium chloride flush Medications Prior to Admission:  Prior to Admission medications   Medication Sig Start Date End Date Taking? Authorizing Provider  ferrous sulfate 325 (65 FE) MG tablet Take 1 tablet (325 mg total) by mouth 2 (two) times daily with a meal. 12/28/15  Yes Leroy Sea, MD  furosemide (LASIX) 40 MG tablet Take 1 tablet (40 mg total) by mouth 2 (two) times daily. 01/23/16  Yes Calvert Cantor, MD  glimepiride (AMARYL) 4 MG tablet Take 8 mg by mouth daily with breakfast.    Yes Historical Provider, MD  pantoprazole (PROTONIX) 40 MG tablet Take 1 tablet (40 mg total) by mouth daily. 12/28/15  Yes Leroy Sea, MD  propranolol (INDERAL) 10 MG tablet Take 1 tablet (10 mg total) by mouth 3 (three) times daily. 12/28/15  Yes Leroy Sea, MD  spironolactone (ALDACTONE) 100 MG tablet Take 1 tablet (100 mg total) by mouth 2 (two) times daily. 01/23/16  Yes Calvert Cantor, MD  bisacodyl (DULCOLAX) 5 MG EC tablet Take 1 tablet (5 mg total) by mouth daily as needed for moderate constipation. 12/28/15   Leroy Sea, MD   No Known Allergies Review of Systems  Unable to perform ROS: Mental status change    Physical Exam  Constitutional: He appears well-developed.  HENT:  Head: Normocephalic and atraumatic.  Neck: Normal range of motion.  Pulmonary/Chest: Effort normal.  Musculoskeletal: Normal range of motion.  Neurological:  HOH Lethargic; confused  Skin: Skin is warm and dry.  Psychiatric:  Affect constricted  Nursing note and vitals reviewed.   Vital Signs: BP 115/66 (BP Location: Left Arm)   Pulse 84   Temp 98.3 F (36.8 C) (Oral)    Resp 18   Ht 5\' 5"  (1.651 m)   Wt 74.2 kg (163 lb 9.6 oz)   SpO2 99%   BMI 27.22 kg/m  Pain Assessment: No/denies pain   Pain Score: Asleep   SpO2: SpO2: 99 % O2 Device:SpO2: 99 % O2 Flow Rate: .   IO: Intake/output summary:  Intake/Output Summary (Last 24 hours) at 02/01/16 1158 Last data filed at 02/01/16 1044  Gross per 24 hour  Intake  240 ml  Output             1350 ml  Net            -1110 ml    LBM: Last BM Date: 01/30/16 Baseline Weight: Weight: 82.2 kg (181 lb 4.8 oz) Most recent weight: Weight: 74.2 kg (163 lb 9.6 oz)     Palliative Assessment/Data:   Flowsheet Rows   Flowsheet Row Most Recent Value  Intake Tab  Referral Department  Hospitalist  Unit at Time of Referral  Med/Surg Unit  Palliative Care Primary Diagnosis  Other (Comment)  Date Notified  01/31/16  Palliative Care Type  New Palliative care  Reason for referral  Clarify Goals of Care, Psychosocial or Spiritual support  Date of Admission  01/30/16  Date first seen by Palliative Care  02/01/16  # of days Palliative referral response time  1 Day(s)  # of days IP prior to Palliative referral  1  Clinical Assessment  Palliative Performance Scale Score  40%  Pain Max last 24 hours  Not able to report  Pain Min Last 24 hours  Not able to report  Dyspnea Max Last 24 Hours  Not able to report  Dyspnea Min Last 24 hours  Not able to report  Nausea Max Last 24 Hours  Not able to report  Nausea Min Last 24 Hours  Not able to report  Anxiety Max Last 24 Hours  Not able to report  Anxiety Min Last 24 Hours  Not able to report  Other Max Last 24 Hours  Not able to report  Psychosocial & Spiritual Assessment  Palliative Care Outcomes  Patient/Family meeting held?  Yes  Who was at the meeting?  spoke to pt but needs family present  Palliative Care follow-up planned  Yes, Facility      Time In: 0900 Time Out: 1000 Time Total: 60 min Greater than 50%  of this time was spent counseling  and coordinating care related to the above assessment and plan.  Signed by: Irean HongSarah Grace Basheer Molchan, NP   Please contact Palliative Medicine Team phone at [phone] for questions and concerns.  For individual provider: See Loretha StaplerAmion

## 2016-02-01 NOTE — NC FL2 (Signed)
[place] MEDICAID FL2 LEVEL OF CARE SCREENING TOOL     IDENTIFICATION  Patient Name: Justin Scott H [place] Birthdate: 09-14-1950 Sex: male Admission Date (Current Location): 01/30/2016  [facility] [place]   Facility and Address:  [facility], [address], [place], [place] [address]      Provider Number: [id number]  Attending Physician Name and Address:  Rolly SalterPranav M Patel, MD  Relative Name and Phone Number:  Steward DroneBrenda, sister, [phone]    Current Level of Care: Hospital Recommended Level of Care: Skilled Nursing Facility Prior Approval Number:    Date Approved/Denied:   PASRR Number: [id number]  Discharge Plan: SNF    Current Diagnoses: Patient Active Problem List   Diagnosis Date Noted  . Palliative care encounter   . Encephalopathy, hepatic (HCC)   . Acute encephalopathy 01/30/2016  . DM type 2, goal HbA1c < 7% (HCC)   . Nonadherence to medical treatment 01/22/2016  . Acute kidney injury (HCC) 01/22/2016  . Hyponatremia 01/22/2016  . Left hip pain 01/22/2016  . Cirrhosis of liver with ascites (HCC)   . Gastric erosions   . Cirrhosis (HCC)   . Fluid overload 12/25/2015  . Prolonged Q-T interval on ECG 12/25/2015  . Absolute anemia   . Ascites   . Cryptogenic cirrhosis (HCC)   . Portal hypertension (HCC)   . Abnormal CT of liver   . Anemia of chronic disease   . Hepatic cirrhosis (HCC) 03/10/2013  . Acute upper GI bleed 03/10/2013  . Diabetes mellitus, type 2 (HCC) 03/10/2013  . Esophageal varices with bleeding (HCC) 03/10/2013  . Acute blood loss anemia 03/10/2013  . Transaminitis 03/10/2013  . Thrombocytopenia (HCC) 03/10/2013    Orientation RESPIRATION BLADDER Height & Weight     Self, Time, Place, Situation (Some confusion)  Normal Continent Weight: 74.2 kg (163 lb 9.6 oz) Height:  5\' 5"  (165.1 cm)  BEHAVIORAL SYMPTOMS/MOOD NEUROLOGICAL BOWEL NUTRITION STATUS      Continent Diet (Please see DC Summary)   AMBULATORY STATUS COMMUNICATION OF NEEDS Skin   Limited Assist Verbally Normal                       Personal Care Assistance Level of Assistance  Bathing, Feeding, Dressing Bathing Assistance: Limited assistance Feeding assistance: Independent Dressing Assistance: Limited assistance     Functional Limitations Info  Hearing   Hearing Info: Impaired      SPECIAL CARE FACTORS FREQUENCY  PT (By licensed PT)     PT Frequency: 5x/week              Contractures Contractures Info: Not present    Additional Factors Info  Code Status, Allergies, Insulin Sliding Scale Code Status Info: Full Allergies Info: NKA   Insulin Sliding Scale Info: insulin aspart (novoLOG) injection 0-5 Units;insulin aspart (novoLOG) injection 0-9 Units;       Current Medications (02/01/2016):  This is the current hospital active medication list Current Facility-Administered Medications  Medication Dose Route Frequency Provider Last Rate Last Dose  . 0.9 %  sodium chloride infusion  250 mL Intravenous PRN Ozella Rocksavid J Merrell, MD      . furosemide (LASIX) tablet 40 mg  40 mg Oral BID Ozella Rocksavid J Merrell, MD   40 mg at 02/01/16 0800  . insulin aspart (novoLOG) injection 0-5 Units  0-5 Units Subcutaneous QHS Ozella Rocksavid J Merrell, MD      . insulin aspart (novoLOG) injection 0-9 Units  0-9  Units Subcutaneous TID WC Ozella Rocks, MD   1 Units at 01/31/16 1751  . lactulose (CHRONULAC) 10 GM/15ML solution 20 g  20 g Oral BID Ozella Rocks, MD   20 g at 02/01/16 0900  . pantoprazole (PROTONIX) EC tablet 40 mg  40 mg Oral Daily Ozella Rocks, MD   40 mg at 02/01/16 0900  . propranolol (INDERAL) tablet 10 mg  10 mg Oral TID Ozella Rocks, MD   10 mg at 02/01/16 0900  . rifaximin (XIFAXAN) tablet 550 mg  550 mg Oral TID Rolly Salter, MD   550 mg at 02/01/16 0900  . sodium chloride flush (NS) 0.9 % injection 3 mL  3 mL Intravenous Q12H Ozella Rocks, MD   3 mL at 02/01/16 0904  . sodium chloride flush (NS)  0.9 % injection 3 mL  3 mL Intravenous PRN Ozella Rocks, MD      . spironolactone (ALDACTONE) tablet 100 mg  100 mg Oral BID Ozella Rocks, MD   100 mg at 02/01/16 0900     Discharge Medications: Please see discharge summary for a list of discharge medications.  Relevant Imaging Results:  Relevant Lab Results:   Additional Information SSN: [id number] [place], [place]

## 2016-02-02 LAB — BASIC METABOLIC PANEL
Anion gap: 7 (ref 5–15)
BUN: 32 mg/dL — AB (ref 6–20)
CHLORIDE: 98 mmol/L — AB (ref 101–111)
CO2: 25 mmol/L (ref 22–32)
CREATININE: 1.44 mg/dL — AB (ref 0.61–1.24)
Calcium: 8.8 mg/dL — ABNORMAL LOW (ref 8.9–10.3)
GFR calc non Af Amer: 51 mL/min — ABNORMAL LOW (ref >60)
GFR, EST AFRICAN AMERICAN: 59 mL/min — AB (ref >60)
Glucose, Bld: 149 mg/dL — ABNORMAL HIGH (ref 65–99)
Potassium: 4.9 mmol/L (ref 3.5–5.1)
Sodium: 130 mmol/L — ABNORMAL LOW (ref 135–145)

## 2016-02-02 LAB — GLUCOSE, CAPILLARY
GLUCOSE-CAPILLARY: 234 mg/dL — AB (ref 65–99)
GLUCOSE-CAPILLARY: 184 mg/dL — AB (ref 65–99)
GLUCOSE-CAPILLARY: 169 mg/dL — AB (ref 65–99)
Glucose-Capillary: 146 mg/dL — ABNORMAL HIGH (ref 65–99)

## 2016-02-02 LAB — PREALBUMIN: PREALBUMIN: 6.4 mg/dL — AB (ref 18–38)

## 2016-02-02 LAB — AMMONIA: Ammonia: 49 umol/L — ABNORMAL HIGH (ref 9–35)

## 2016-02-02 MED ORDER — ACETAMINOPHEN 325 MG PO TABS
650.0000 mg | ORAL_TABLET | Freq: Three times a day (TID) | ORAL | Status: DC | PRN
  Administered 2016-02-02: 650 mg via ORAL
  Filled 2016-02-02: qty 2

## 2016-02-02 NOTE — Progress Notes (Signed)
[facility] Progress Note  Patient: Justin Scott ZOX:[id number]   PCP: Kaleen MaskELKINS,WILSON OLIVER, MD DOB: 05/30/1950   DOA: 01/30/2016   DOS: 02/02/2016   Date of Service: the patient was seen and examined on 02/02/2016  Brief hospital course: Pt. with PMH of Cryptogenic cirrhosis, portal hypertension, hepatic encephalopathy; admitted on 01/30/2016, with complaint of confusion, was found to have mild worsening of hepatic encephalopathy. Currently further plan is continue management, consult palliative care for goals of care discussion.  Assessment and Plan: 1. Acute encephalopathy Significant improvement and appears to be baseline. No asterixis on examination. ammonia level better in the morning. Continue rifaximin, increase the dose per pharmacy.  2. Cryptogenic liver cirrhosis. Appreciate GI input. With high MELD score GI recommends to discussed with palliative care provider to decide goals of care. Consulted palliative care. Continue to monitor.  3. Acute kidney injury. Mild elevation of serum creatinine. We'll continue to monitor.  4. GERD. Continue PPI.  5. Anemia, normocytic anemia. Secondary to cirrhosis. We'll continue to monitor.  Pain management: When necessary Tylenol Activity: SNF per physical therapy Bowel regimen: last BM 02/01/2016 Diet: Low-salt diet DVT Prophylaxis: mechanical compression device.  Advance goals of care discussion: Full code  Family Communication: no family was present at bedside, at the time of interview.    Disposition:  Discharge to SNF. Expected discharge date: 02/04/2016  Consultants: Gastroenterology Procedures: none  Antibiotics: Anti-infectives    Start     Dose/Rate Route Frequency Ordered Stop   02/01/16 2200  rifaximin (XIFAXAN) tablet 550 mg     550 mg Oral 2 times daily 02/01/16 1454     01/31/16 1600  rifaximin (XIFAXAN) tablet 550 mg  Status:  Discontinued     550 mg Oral 3 times daily 01/31/16 1230 02/01/16  1454   01/30/16 2200  rifaximin (XIFAXAN) tablet 200 mg  Status:  Discontinued     200 mg Oral 3 times daily 01/30/16 1829 01/31/16 1230     Subjective: remains lethargic and confused. Denies any acute complaint.   Objective: Physical Exam: Vitals:   02/01/16 2104 02/02/16 0547 02/02/16 0840 02/02/16 1409  BP: 119/60 119/69 125/82 119/76  Pulse: 80 81 79 84  Resp: 18 18 16 17   Temp: 98 F (36.7 C) 98.3 F (36.8 C)  98 F (36.7 C)  TempSrc: Oral Oral  Oral  SpO2: 98% 97% 98% 98%  Weight:  87.2 kg (192 lb 3.2 oz)    Height:        Intake/Output Summary (Last 24 hours) at 02/02/16 1752 Last data filed at 02/02/16 1629  Gross per 24 hour  Intake              543 ml  Output             1000 ml  Net             -457 ml   Filed Weights   01/30/16 1355 02/01/16 0700 02/02/16 0547  Weight: 82.2 kg (181 lb 4.8 oz) 74.2 kg (163 lb 9.6 oz) 87.2 kg (192 lb 3.2 oz)    General: Alert, Awake and Oriented to Time, Place and Person. Appear in mild distress, affect appropriate Eyes: PERRL, Conjunctiva normal ENT: Oral Mucosa clear moist. Neck: difficult to assess JVD, no Abnormal Mass Or lumps Cardiovascular: S1 and S2 Present, no Murmur, Respiratory: Bilateral Air entry equal and Decreased, no use of accessory muscle, Clear to Auscultation, no Crackles, no wheezes Abdomen: Bowel Sound present, Soft and  distended, no tenderness Skin: no redness, no Rash, no induration Extremities: no Pedal edema, no calf tenderness Neurologic: Grossly no focal neuro deficit. Bilaterally Equal motor strength No asterixis  Data Reviewed: CBC:  Recent Labs Lab 01/29/16 1040 01/30/16 1525 01/31/16 0500 02/01/16 0536  WBC 9.8 9.0 9.6 12.6*  NEUTROABS  --  7.0  --  9.6*  HGB 13.5 12.4* 12.3* 12.7*  HCT 43.3 38.4* 38.3* 39.7  MCV 82.3 80.8 81.3 81.5  PLT 160 130* 127* 131*   Basic Metabolic Panel:  Recent Labs Lab 01/29/16 1040 01/30/16 1525 01/31/16 0500 02/01/16 0536 02/02/16 0841  NA  132* 133* 134* 133* 130*  K 4.7 4.6 4.2 4.7 4.9  CL 99* 99* 102 100* 98*  CO2 22 25 24 24 25   GLUCOSE 208* 168* 66 139* 149*  BUN 44* 41* 37* 34* 32*  CREATININE 1.86* 1.77* 1.45* 1.54* 1.44*  CALCIUM 9.1 8.6* 8.7* 8.8* 8.8*  MG  --  2.2  --  2.1  --   PHOS  --  3.8  --   --   --     Liver Function Tests:  Recent Labs Lab 01/29/16 1040 01/30/16 1525 01/31/16 0500 02/01/16 0536  AST 31 31 29  36  ALT 16* 17 16* 17  ALKPHOS 91 84 77 87  BILITOT 1.7* 1.2 1.4* 1.3*  PROT 8.2* 6.9 6.9 6.6  ALBUMIN 2.5* 2.0* 1.9* 2.0*    Recent Labs Lab 01/30/16 1525  LIPASE 75*    Recent Labs Lab 01/30/16 1525 02/02/16 0842  AMMONIA 119* 49*   Coagulation Profile:  Recent Labs Lab 01/29/16 1330 01/30/16 1525  INR 1.69 1.93   Cardiac Enzymes: No results for input(s): CKTOTAL, CKMB, CKMBINDEX, TROPONINI in the last 168 hours. BNP (last 3 results) No results for input(s): PROBNP in the last 8760 hours.  CBG:  Recent Labs Lab 02/01/16 1640 02/01/16 2228 02/02/16 0757 02/02/16 1152 02/02/16 1713  GLUCAP 195* 186* 146* 234* 184*   Studies: No results found.   Scheduled Meds: . furosemide  40 mg Oral BID  . insulin aspart  0-5 Units Subcutaneous QHS  . insulin aspart  0-9 Units Subcutaneous TID WC  . lactulose  20 g Oral BID  . pantoprazole  40 mg Oral Daily  . propranolol  10 mg Oral TID  . rifaximin  550 mg Oral BID  . sodium chloride flush  3 mL Intravenous Q12H  . spironolactone  100 mg Oral BID   Continuous Infusions:  PRN Meds: sodium chloride, sodium chloride flush  Time spent: 20 minutes  Author: Lynden Oxford, MD [facility] Pager: [phone] 02/02/2016 5:52 PM  If 7PM-7AM, please contact night-coverage at [email or web address], password [facility]

## 2016-02-02 NOTE — Progress Notes (Signed)
Progress Note  Assessment / Plan:   Assessment: * Decompensated NASH cirrhosis.  * Hepatic encephalopathy. The lactulose was causing quite a bit of diarrhea.  Solid stool this morning.  * Ascites. No SBPdetected on recent paracenteses 4. Not clear if he's been taking the diuretics as prescribed. Looks to have significant ascites currently.  Medical mgt with appropriated doses of diuretics for now.  If refractory ascites or pt too uncomfortable: consider repeat tap.   Mild CKD but overall stable BUN/creat.    * History of esophageal varices and variceal bleeding. Band ligation 02/2013. On 12/2015 EGD. Minimal residual varices present, erosive gastritis, no obvious portal hypertensive gastropathy, incidental nonobstructing and asymptomatic peptic esophageal stricture..   *  Coagulopathy.   *  Protein malnutrition.   Plan: 1. Continue 2 g sodium diet 2. Palliative care consult pending for tomorrow at 4:00 with the patient's sister present to is his HCPOA 3. Please await any further recommendations from Dr. Leone PayorGessner  Thank you for your kind consultation, we will continue to.   LOS: 1 day   Unk LightningJennifer Lynne Lemmon  02/02/2016, 11:34 AM  Pager # [phone]    [facility] Attending   I have taken an interval history, reviewed the chart and examined the patient. I agree with the Advanced Practitioner's note, impression and recommendations.  We will see him again on Monday - palliative care consult results will be important re: goals of care.  Iva Booparl E. Gessner, MD, [facility] [phone] (pager) [phone] after 5 PM, weekends and holidays  02/02/2016 3:27 PM'      Subjective  Chief Complaint: weakness  Patient explains that he feels slightly less weak today, he did have a solid stool this morning which remains in his bedside commode and he is eager to show me. He is tolerating a solid food intake. He denies any new complaints or concerns.    Objective   Vital signs in last 24 hours: Temp:  [98 F (36.7 C)-98.6 F (37 C)] 98.3 F (36.8 C) (10/14 0547) Pulse Rate:  [79-81] 79 (10/14 0840) Resp:  [16-18] 16 (10/14 0840) BP: (100-125)/(60-82) 125/82 (10/14 0840) SpO2:  [97 %-98 %] 98 % (10/14 0840) Weight:  [192 lb 3.2 oz (87.2 kg)] 192 lb 3.2 oz (87.2 kg) (10/14 0547) Last BM Date: 02/01/16 General: Caucasian male appears chronically ill Heart:  Regular rate and rhythm; no murmurs Lungs: Respirations even and unlabored, lungs CTA bilaterally Abdomen:  Soft, nontender and moderate distension Normal bowel sounds. Extremities:  2-3+ pedal edema, lower ext edema Neurologic:  Alert and oriented,  grossly normal neurologically. Psych:  Cooperative. Normal mood and affect.dull affect  Intake/Output from previous day: 10/13 0701 - 10/14 0700 In: 420 [P.O.:420] Out: 1900 [Urine:1700; Stool:200]  Lab Results:  Recent Labs  01/30/16 1525 01/31/16 0500 02/01/16 0536  WBC 9.0 9.6 12.6*  HGB 12.4* 12.3* 12.7*  HCT 38.4* 38.3* 39.7  PLT 130* 127* 131*   BMET  Recent Labs  01/31/16 0500 02/01/16 0536 02/02/16 0841  NA 134* 133* 130*  K 4.2 4.7 4.9  CL 102 100* 98*  CO2 24 24 25   GLUCOSE 66 139* 149*  BUN 37* 34* 32*  CREATININE 1.45* 1.54* 1.44*  CALCIUM 8.7* 8.8* 8.8*   LFT  Recent Labs  02/01/16 0536  PROT 6.6  ALBUMIN 2.0*  AST 36  ALT 17  ALKPHOS 87  BILITOT 1.3*   PT/INR  Recent Labs  01/30/16 1525  LABPROT 22.3*  INR  1.93         

## 2016-02-02 NOTE — Progress Notes (Signed)
Spoke to pt's sister Pati GalloBrenda Gage who is his primary HCPOA. Plan for GOC mtg tomorrow at 1600. Eduard RouxSarah Dayne Chait, ANP

## 2016-02-03 LAB — COMPREHENSIVE METABOLIC PANEL
ALBUMIN: 1.9 g/dL — AB (ref 3.5–5.0)
ALT: 15 U/L — ABNORMAL LOW (ref 17–63)
ANION GAP: 8 (ref 5–15)
AST: 43 U/L — ABNORMAL HIGH (ref 15–41)
Alkaline Phosphatase: 88 U/L (ref 38–126)
BUN: 36 mg/dL — ABNORMAL HIGH (ref 6–20)
CO2: 25 mmol/L (ref 22–32)
Calcium: 8.7 mg/dL — ABNORMAL LOW (ref 8.9–10.3)
Chloride: 97 mmol/L — ABNORMAL LOW (ref 101–111)
Creatinine, Ser: 1.57 mg/dL — ABNORMAL HIGH (ref 0.61–1.24)
GFR calc Af Amer: 53 mL/min — ABNORMAL LOW (ref >60)
GFR calc non Af Amer: 46 mL/min — ABNORMAL LOW (ref >60)
GLUCOSE: 124 mg/dL — AB (ref 65–99)
POTASSIUM: 5.1 mmol/L (ref 3.5–5.1)
SODIUM: 130 mmol/L — AB (ref 135–145)
Total Bilirubin: 1.9 mg/dL — ABNORMAL HIGH (ref 0.3–1.2)
Total Protein: 6.5 g/dL (ref 6.5–8.1)

## 2016-02-03 LAB — CBC WITH DIFFERENTIAL/PLATELET
BASOS ABS: 0 10*3/uL (ref 0.0–0.1)
Basophils Relative: 0 %
Eosinophils Absolute: 0.2 10*3/uL (ref 0.0–0.7)
Eosinophils Relative: 2 %
HEMATOCRIT: 39.6 % (ref 39.0–52.0)
HEMOGLOBIN: 12.7 g/dL — AB (ref 13.0–17.0)
LYMPHS PCT: 9 %
Lymphs Abs: 1 10*3/uL (ref 0.7–4.0)
MCH: 26.2 pg (ref 26.0–34.0)
MCHC: 32.1 g/dL (ref 30.0–36.0)
MCV: 81.8 fL (ref 78.0–100.0)
MONO ABS: 1.4 10*3/uL — AB (ref 0.1–1.0)
MONOS PCT: 12 %
NEUTROS ABS: 8.7 10*3/uL — AB (ref 1.7–7.7)
NEUTROS PCT: 77 %
Platelets: 102 10*3/uL — ABNORMAL LOW (ref 150–400)
RBC: 4.84 MIL/uL (ref 4.22–5.81)
WBC: 11.4 10*3/uL — ABNORMAL HIGH (ref 4.0–10.5)

## 2016-02-03 LAB — GLUCOSE, CAPILLARY
GLUCOSE-CAPILLARY: 208 mg/dL — AB (ref 65–99)
GLUCOSE-CAPILLARY: 156 mg/dL — AB (ref 65–99)
Glucose-Capillary: 125 mg/dL — ABNORMAL HIGH (ref 65–99)
Glucose-Capillary: 252 mg/dL — ABNORMAL HIGH (ref 65–99)

## 2016-02-03 LAB — MAGNESIUM: Magnesium: 2 mg/dL (ref 1.7–2.4)

## 2016-02-03 MED ORDER — FUROSEMIDE 10 MG/ML IJ SOLN
40.0000 mg | Freq: Every day | INTRAMUSCULAR | Status: DC
  Administered 2016-02-03 – 2016-02-05 (×3): 40 mg via INTRAVENOUS
  Filled 2016-02-03 (×3): qty 4

## 2016-02-03 MED ORDER — ALBUMIN HUMAN 25 % IV SOLN
12.5000 g | Freq: Once | INTRAVENOUS | Status: AC
  Administered 2016-02-03: 12.5 g via INTRAVENOUS
  Filled 2016-02-03: qty 50

## 2016-02-03 NOTE — Progress Notes (Signed)
[facility] Progress Note  Patient: Justin Scott WUJ:[id number]   PCP: Kaleen Mask, MD DOB: June 05, 1950   DOA: 01/30/2016   DOS: 02/03/2016   Date of Service: the patient was seen and examined on 02/03/2016  Brief hospital course: Pt. with PMH of Cryptogenic cirrhosis, portal hypertension, hepatic encephalopathy; admitted on 01/30/2016, with complaint of confusion, was found to have mild worsening of hepatic encephalopathy.GI feels that patient's current presentation is likely secondary to noncompliance with medical regimen at home. Currently further plan is continue management, consult palliative care for goals of care discussion.  Assessment and Plan: 1. Acute encephalopathy Significant improvement and appears to be baseline. No asterixis on examination. ammonia level better Continue rifaximin, continue lactulose. Rifaximin dose increase per pharmacy.  2. Cryptogenic liver cirrhosis. Appreciate GI input. With high MELD score GI recommends to discussed with palliative care provider to decide goals of care. Consulted palliative care. Continue to monitor. Continue oral Aldactone, change oral 40 mg twice a day Lasix to IV Lasix 40 mg. Consider paracentesis for palliation  3. Acute kidney injury. Mild elevation of serum creatinine. We'll continue to monitor.  4. GERD. Continue PPI.  5. Anemia, normocytic anemia. Secondary to cirrhosis. We'll continue to monitor.  Pain management: When necessary Tylenol Activity: SNF per physical therapy Bowel regimen: last BM 02/02/2016 Diet: Low-salt diet DVT Prophylaxis: mechanical compression device.  Advance goals of care discussion: Full code  Family Communication: no family was present at bedside, at the time of interview.  Family meeting with palliative care today at 4 PM.  Disposition:  Discharge to SNF. Expected discharge date: 02/04/2016  Consultants: Gastroenterology, palliative care Procedures:  none  Antibiotics: Anti-infectives    Start     Dose/Rate Route Frequency Ordered Stop   02/01/16 2200  rifaximin (XIFAXAN) tablet 550 mg     550 mg Oral 2 times daily 02/01/16 1454     01/31/16 1600  rifaximin (XIFAXAN) tablet 550 mg  Status:  Discontinued     550 mg Oral 3 times daily 01/31/16 1230 02/01/16 1454   01/30/16 2200  rifaximin (XIFAXAN) tablet 200 mg  Status:  Discontinued     200 mg Oral 3 times daily 01/30/16 1829 01/31/16 1230     Subjective: remains lethargic and confused. Denies any acute complaint.   Objective: Physical Exam: Vitals:   02/02/16 1409 02/02/16 2118 02/03/16 0527 02/03/16 1023  BP: 119/76 (!) 98/56 (!) 115/93 119/78  Pulse: 84 82 74 79  Resp: 17 17 19    Temp: 98 F (36.7 C) 99.1 F (37.3 C) 98 F (36.7 C)   TempSrc: Oral Oral Oral   SpO2: 98% 96% 100% (!) 65%  Weight:   88.5 kg (195 lb 1.7 oz)   Height:        Intake/Output Summary (Last 24 hours) at 02/03/16 1354 Last data filed at 02/03/16 1130  Gross per 24 hour  Intake              960 ml  Output             1400 ml  Net             -440 ml   Filed Weights   02/01/16 0700 02/02/16 0547 02/03/16 0527  Weight: 74.2 kg (163 lb 9.6 oz) 87.2 kg (192 lb 3.2 oz) 88.5 kg (195 lb 1.7 oz)    General: Alert, Awake and Oriented to Time, Place and Person. Appear in mild distress, affect appropriate Eyes: PERRL, Conjunctiva normal  ENT: Oral Mucosa clear moist. Neck: difficult to assess JVD, no Abnormal Mass Or lumps Cardiovascular: S1 and S2 Present, no Murmur, Respiratory: Bilateral Air entry equal and Decreased, no use of accessory muscle, Clear to Auscultation, no Crackles, no wheezes Abdomen: Bowel Sound present, Soft and distended, no tenderness Skin: no redness, no Rash, no induration Extremities: no Pedal edema, no calf tenderness Neurologic: Grossly no focal neuro deficit. Bilaterally Equal motor strength No asterixis  Data Reviewed: CBC:  Recent Labs Lab 01/29/16 1040  01/30/16 1525 01/31/16 0500 02/01/16 0536 02/03/16 0642  WBC 9.8 9.0 9.6 12.6* 11.4*  NEUTROABS  --  7.0  --  9.6* 8.7*  HGB 13.5 12.4* 12.3* 12.7* 12.7*  HCT 43.3 38.4* 38.3* 39.7 39.6  MCV 82.3 80.8 81.3 81.5 81.8  PLT 160 130* 127* 131* 102*   Basic Metabolic Panel:  Recent Labs Lab 01/30/16 1525 01/31/16 0500 02/01/16 0536 02/02/16 0841 02/03/16 0642  NA 133* 134* 133* 130* 130*  K 4.6 4.2 4.7 4.9 5.1  CL 99* 102 100* 98* 97*  CO2 25 24 24 25 25   GLUCOSE 168* 66 139* 149* 124*  BUN 41* 37* 34* 32* 36*  CREATININE 1.77* 1.45* 1.54* 1.44* 1.57*  CALCIUM 8.6* 8.7* 8.8* 8.8* 8.7*  MG 2.2  --  2.1  --  2.0  PHOS 3.8  --   --   --   --     Liver Function Tests:  Recent Labs Lab 01/29/16 1040 01/30/16 1525 01/31/16 0500 02/01/16 0536 02/03/16 0642  AST 31 31 29  36 43*  ALT 16* 17 16* 17 15*  ALKPHOS 91 84 77 87 88  BILITOT 1.7* 1.2 1.4* 1.3* 1.9*  PROT 8.2* 6.9 6.9 6.6 6.5  ALBUMIN 2.5* 2.0* 1.9* 2.0* 1.9*    Recent Labs Lab 01/30/16 1525  LIPASE 75*    Recent Labs Lab 01/30/16 1525 02/02/16 0842  AMMONIA 119* 49*   Coagulation Profile:  Recent Labs Lab 01/29/16 1330 01/30/16 1525  INR 1.69 1.93   Cardiac Enzymes: No results for input(s): CKTOTAL, CKMB, CKMBINDEX, TROPONINI in the last 168 hours. BNP (last 3 results) No results for input(s): PROBNP in the last 8760 hours.  CBG:  Recent Labs Lab 02/02/16 1152 02/02/16 1713 02/02/16 2117 02/03/16 0820 02/03/16 1206  GLUCAP 234* 184* 169* 125* 208*   Studies: No results found.   Scheduled Meds: . furosemide  40 mg Intravenous Daily  . insulin aspart  0-5 Units Subcutaneous QHS  . insulin aspart  0-9 Units Subcutaneous TID WC  . lactulose  20 g Oral BID  . pantoprazole  40 mg Oral Daily  . propranolol  10 mg Oral TID  . rifaximin  550 mg Oral BID  . sodium chloride flush  3 mL Intravenous Q12H  . spironolactone  100 mg Oral BID   Continuous Infusions:  PRN Meds: sodium  chloride, acetaminophen, sodium chloride flush  Time spent: 20 minutes  Author: Lynden OxfordPranav Elin Seats, MD [facility] Pager: [phone] 02/03/2016 1:54 PM  If 7PM-7AM, please contact night-coverage at [email or web address], password [email or web address]

## 2016-02-03 NOTE — Progress Notes (Signed)
Daily Progress Note   Patient Name: Justin Scott       Date: 02/03/2016 DOB: 26-Jul-1950  Age: 65 y.o. MRN#: [id number] Attending Physician: Justin Hamman, MD Primary Care Physician: Justin Downing, MD Admit Date: 01/30/2016  Reason for Consultation/Follow-up: Establishing goals of care and Psychosocial/spiritual support  Subjective: Patient's abdomen more distended; increased ascites, belly very firm. 3+ lower extremity edema. Ammonia level on 02/02/2016 down to 49 from 119 but patient very lethargic. Patient underwent paracentesis on 01/29/2016 for 5 L. In speaking with attending Justin Scott for further paracentesis in the morning which patient is willing to do.  Length of Stay: 2  Current Medications: Scheduled Meds:  . furosemide  40 mg Intravenous Daily  . insulin aspart  0-5 Units Subcutaneous QHS  . insulin aspart  0-9 Units Subcutaneous TID WC  . lactulose  20 g Oral BID  . pantoprazole  40 mg Oral Daily  . propranolol  10 mg Oral TID  . rifaximin  550 mg Oral BID  . sodium chloride flush  3 mL Intravenous Q12H  . spironolactone  100 mg Oral BID    Continuous Infusions:    PRN Meds: sodium chloride, acetaminophen, sodium chloride flush  Physical Exam  Constitutional: He appears well-developed and well-nourished.  Neck: Normal range of motion.  Cardiovascular:  Hypotensive 3+ LE edema  Pulmonary/Chest:  Increased work of breathing  Abdominal: He exhibits distension. There is tenderness.  Increased ascites  Musculoskeletal: Normal range of motion.  Neurological:  Lethargic; having difficulty staying awake  Skin: Skin is warm and dry.  Psychiatric:  Poor concentration  Nursing note and vitals reviewed.           Vital Signs: BP 124/77   Pulse 83   Temp  98.1 F (36.7 C) (Oral)   Resp 19   Ht '5\' 5"'$  (1.651 m)   Wt 88.5 kg (195 lb 1.7 oz)   SpO2 100%   BMI 32.47 kg/m  SpO2: SpO2: 100 % O2 Device: O2 Device: Not Delivered O2 Flow Rate:    Intake/output summary:  Intake/Output Summary (Last 24 hours) at 02/03/16 1846 Last data filed at 02/03/16 1600  Gross per 24 hour  Intake              720 ml  Output  1500 ml  Net             -780 ml   LBM: Last BM Date: 02/03/16 Baseline Weight: Weight: 82.2 kg (181 lb 4.8 oz) Most recent weight: Weight: 88.5 kg (195 lb 1.7 oz)       Palliative Assessment/Data:    Flowsheet Rows   Flowsheet Row Most Recent Value  Intake Tab  Referral Department  Hospitalist  Unit at Time of Referral  Med/Surg Unit  Palliative Care Primary Diagnosis  Other (Comment)  Date Notified  01/31/16  Palliative Care Type  New Palliative care  Reason for referral  Clarify Goals of Care, Psychosocial or Spiritual support  Date of Admission  01/30/16  Date first seen by Palliative Care  02/01/16  # of days Palliative referral response time  1 Day(s)  # of days IP prior to Palliative referral  1  Clinical Assessment  Palliative Performance Scale Score  40%  Pain Max last 24 hours  Not able to report  Pain Min Last 24 hours  Not able to report  Dyspnea Max Last 24 Hours  Not able to report  Dyspnea Min Last 24 hours  Not able to report  Nausea Max Last 24 Hours  Not able to report  Nausea Min Last 24 Hours  Not able to report  Anxiety Max Last 24 Hours  Not able to report  Anxiety Min Last 24 Hours  Not able to report  Other Max Last 24 Hours  Not able to report  Psychosocial & Spiritual Assessment  Palliative Care Outcomes  Patient/Family meeting held?  Yes  Who was at the meeting?  spoke to pt but needs family present  Palliative Care follow-up planned  Yes, Facility      Patient Active Problem List   Diagnosis Date Noted  . Hepatic encephalopathy ([place]) 02/01/2016  . Palliative care  encounter   . Encephalopathy, hepatic ([place])   . Acute encephalopathy 01/30/2016  . DM type 2, goal HbA1c < 7% ([facility])   . Nonadherence to medical treatment 01/22/2016  . Acute kidney injury ([place]) 01/22/2016  . Hyponatremia 01/22/2016  . Left hip pain 01/22/2016  . Cirrhosis of liver with ascites ([place])   . Gastric erosions   . Cirrhosis ([place])   . Fluid overload 12/25/2015  . Prolonged Q-T interval on ECG 12/25/2015  . Absolute anemia   . Ascites   . Cryptogenic cirrhosis ([place])   . Portal hypertension ([place])   . Abnormal CT of liver   . Anemia of chronic disease   . Hepatic cirrhosis ([place]) 03/10/2013  . Acute upper GI bleed 03/10/2013  . Diabetes mellitus, type 2 ([place]) 03/10/2013  . Esophageal varices with bleeding ([place]) 03/10/2013  . Acute blood loss anemia 03/10/2013  . Transaminitis 03/10/2013  . Thrombocytopenia ([place]) 03/10/2013    Palliative Care Assessment & Plan   Patient Profile: Pt. with PMH of Cryptogenic cirrhosis, portal hypertension, hepatic encephalopathy; admitted on 01/30/2016, with complaint of confusion, was found to have mild worsening of hepatic encephalopathy.GI feels that patient's current presentation is likely secondary to noncompliance with medical regimen at home. Patient was originally diagnosed with cirrhosis in 2014 in the [place] of esophageal varices. He is beginning to exhibit a pattern of returning ascitic fluid within just a matter of days as well as elevated ammonia level. His albumin is 2.0. His meld score is 22 which holds a 19.6% 3 month mortality rate.    Assessment: Met with patient's sister alone as  well as with patient. Patient's sister Justin Scott is his healthcare power of attorney. A great deal of our discussion centered on disease education and progression with end-stage liver disease, encephalopathy coagulopathy as well as the possibility of a prognosis of less than 6 months. We also discussed terms of full code and DO NOT RESUSCITATE  which sister was not familiar with.  Recommendations/Plan:  Continue full code for now. Sister would like to speak with her sister, review advanced directives at home. Plan is for a member of the palliative medicine team to call her tomorrow between 1:30 PM and 2:30 PM and see if she is ready to go forward to make her brother a DO NOT RESUSCITATE. She understands that the likelihood of successfully resuscitating him is very limited and would not correct his underlying terminal illness  Patient agreeable for paracentesis in the morning  Patient agrees to go to a skilled nursing facility. His wife is a resident at [facility] and he is agreeable to go therefore what we hope is skilled days. Patient is unable to care for himself at home further and his family has been struggling with his insistence to return back to his house. He has been getting up at the hospital with 1 person assist and doing some of his own ADLs with standby observation  Sister has already spoken to the nursing director at [facility] health care in terms of patient being admitted there  Goals of Care and Additional Recommendations:  Limitations on Scope of Treatment: Full Scope Treatment  Code Status:    Code Status Orders        Start     Ordered   01/30/16 1507  Full code  Continuous     01/30/16 1512    Code Status History    Date Active Date Inactive Code Status Order ID Comments User Context   01/22/2016  2:31 PM 01/23/2016  5:06 PM DNR [id number]  Samella Parr, NP ED   12/25/2015 12:56 PM 12/28/2015  9:54 PM Full Code [id number]  Willia Craze, NP Inpatient   03/10/2013  2:13 AM 03/13/2013  5:22 PM Full Code [id number]  Berle Mull, MD Inpatient    Advance Directive Documentation   Flowsheet Row Most Recent Value  Type of Advance Directive  Healthcare Power of Attorney  Pre-existing out of facility DNR order (yellow form or pink MOST form)  No data  "MOST" Form in Place?  No data        Prognosis:   < 6 months in the [place] of advanced liver failure, cirrhosis secondary to cryptogenic cirrhosis, recurrent ascites, recurrent elevated ammonia levels. Patient is at high risk for an acute event related to hepatic encephalopathy or exsanguination. Patient is likely eligible for hospice in facility benefit. I did share this with patient's sister  Discharge Planning:  [facility] for rehab with Palliative care service follow-up  Care plan was discussed with Dr. Posey Pronto  Thank you for allowing the Palliative Medicine Team to assist in the care of this patient.   Time In: 1600 Time Out: 1730 Total Time 90 min Prolonged Time Billed  yes       Greater than 50%  of this time was spent counseling and coordinating care related to the above assessment and plan.  Dory Horn, NP  Please contact Palliative Medicine Team phone at [phone] for questions and concerns.

## 2016-02-04 LAB — BASIC METABOLIC PANEL
ANION GAP: 10 (ref 5–15)
Anion gap: 8 (ref 5–15)
BUN: 38 mg/dL — ABNORMAL HIGH (ref 6–20)
BUN: 40 mg/dL — ABNORMAL HIGH (ref 6–20)
CALCIUM: 8.9 mg/dL (ref 8.9–10.3)
CHLORIDE: 94 mmol/L — AB (ref 101–111)
CHLORIDE: 95 mmol/L — AB (ref 101–111)
CO2: 22 mmol/L (ref 22–32)
CO2: 22 mmol/L (ref 22–32)
Calcium: 8.6 mg/dL — ABNORMAL LOW (ref 8.9–10.3)
Creatinine, Ser: 1.55 mg/dL — ABNORMAL HIGH (ref 0.61–1.24)
Creatinine, Ser: 1.58 mg/dL — ABNORMAL HIGH (ref 0.61–1.24)
GFR calc non Af Amer: 46 mL/min — ABNORMAL LOW (ref >60)
GFR calc non Af Amer: 45 mL/min — ABNORMAL LOW (ref >60)
GFR, EST AFRICAN AMERICAN: 54 mL/min — AB (ref >60)
GFR, EST AFRICAN AMERICAN: 52 mL/min — AB (ref >60)
Glucose, Bld: 207 mg/dL — ABNORMAL HIGH (ref 65–99)
Glucose, Bld: 226 mg/dL — ABNORMAL HIGH (ref 65–99)
POTASSIUM: 5.1 mmol/L (ref 3.5–5.1)
Potassium: 5.6 mmol/L — ABNORMAL HIGH (ref 3.5–5.1)
SODIUM: 125 mmol/L — AB (ref 135–145)
Sodium: 126 mmol/L — ABNORMAL LOW (ref 135–145)

## 2016-02-04 LAB — LACTATE DEHYDROGENASE: LDH: 277 U/L — AB (ref 98–192)

## 2016-02-04 LAB — GLUCOSE, CAPILLARY
GLUCOSE-CAPILLARY: 217 mg/dL — AB (ref 65–99)
GLUCOSE-CAPILLARY: 250 mg/dL — AB (ref 65–99)
Glucose-Capillary: 137 mg/dL — ABNORMAL HIGH (ref 65–99)
Glucose-Capillary: 164 mg/dL — ABNORMAL HIGH (ref 65–99)

## 2016-02-04 MED ORDER — HEPARIN SODIUM (PORCINE) 5000 UNIT/ML IJ SOLN
5000.0000 [IU] | Freq: Three times a day (TID) | INTRAMUSCULAR | Status: DC
  Administered 2016-02-04 – 2016-02-07 (×10): 5000 [IU] via SUBCUTANEOUS
  Filled 2016-02-04 (×10): qty 1

## 2016-02-04 MED ORDER — DEXTROSE 5 % IV SOLN
1.0000 g | INTRAVENOUS | Status: DC
  Administered 2016-02-04: 1 g via INTRAVENOUS
  Filled 2016-02-04 (×2): qty 10

## 2016-02-04 NOTE — Progress Notes (Signed)
CSW left sister a voicemail regarding discharge placement. Insurance will not pay for SNF due to patient not being able to participate in therapy. Family will have to privately pay for SNF.  Osborne Cascoadia Tilmon Wisehart LCSWA [phone]

## 2016-02-04 NOTE — Progress Notes (Signed)
      Progress Note   Subjective  Patient reports worsening abdominal distension and discomfort in the abdomen. Appears lethargic but able to answer questions appropriately   Objective   Vital signs in last 24 hours: Temp:  [97.9 F (36.6 C)-98.6 F (37 C)] 98 F (36.7 C) (10/16 1435) Pulse Rate:  [69-83] 77 (10/16 1435) Resp:  [16-18] 16 (10/16 1435) BP: (90-124)/(62-77) 111/76 (10/16 1435) SpO2:  [96 %-100 %] 100 % (10/16 1435) Weight:  [193 lb 14.4 oz (88 kg)] 193 lb 14.4 oz (88 kg) (10/16 0455) Last BM Date: 02/03/16 General:    white male in NAD Heart:  Regular rate and rhythm; no murmurs Lungs: Respirations even and unlabored,  Abdomen:  Soft, distended with ascites.  Extremities:  (+) 2 edema. Neurologic:  Alert and oriented,  No asterixis Psych:  Cooperative. Normal mood and affect.  Intake/Output from previous day: 10/15 0701 - 10/16 0700 In: 660 [P.O.:660] Out: 1350 [Urine:1350] Intake/Output this shift: Total I/O In: -  Out: 250 [Urine:250]  Lab Results:  Recent Labs  02/03/16 0642  WBC 11.4*  HGB 12.7*  HCT 39.6  PLT 102*   BMET  Recent Labs  02/02/16 0841 02/03/16 0642 02/04/16 1128  NA 130* 130* 126*  K 4.9 5.1 5.6*  CL 98* 97* 94*  CO2 25 25 22   GLUCOSE 149* 124* 207*  BUN 32* 36* 38*  CREATININE 1.44* 1.57* 1.55*  CALCIUM 8.8* 8.7* 8.9   LFT  Recent Labs  02/03/16 0642  PROT 6.5  ALBUMIN 1.9*  AST 43*  ALT 15*  ALKPHOS 88  BILITOT 1.9*   PT/INR No results for input(s): LABPROT, INR in the last 72 hours.  Studies/Results: No results found.     Assessment / Plan:   65 y/o male with decompensated NASH cirrhosis reportedly, with history of encephalopathy and ascites, admitted with worsening of each. MELD most recently 20 on admission, high risk for mortality. Palliative care consult appreciated, they wish to continue with standard of care but is now DNR. Despite diuretics, having worsening ascites with pain today. On  review of prior paracentesis from a few weeks ago, labs were c/w SBP at that time. Recommend paracentesis today, large volume to help his discomfort, with cell count. If more than than 5L removed he should have an albumin infusion, and if positive for SBP will need antibiotics. Hyperkalemia noted today.  Recommend the following: - paracentesis as soon as possible, large volume, with cell count and culture, rule out SBP today. If cannot have paracentesis today would cover empirically for SBP  - IV albumin if large volume paracentesis - would hold aldactone with hyperkalemia noted today - low Na < 2gm / day diet - continue lactulose and rifaximin - INR and AFP with labs tomorrow, consider repeat hepatic imaging to ensure no mass lesion, although CT done last month showed no evidence of HCC - continue PPI  - may need to consider holding inderal if ascites continues to quickly reaccumulate - he warrants DVT prophylaxis, okay to use lovenox - regular diet, poor nutritional status at present time  Will follow with you, call with questions.   Ileene PatrickSteven Savaughn Karwowski, MD [facility] Pager [phone]

## 2016-02-04 NOTE — Progress Notes (Signed)
Palliative Medicine RN Note: Sister called PMT office to talk to Justin RouxSarah Scott, who will return Friday. Discussed pt's d/c plan and POA status. Sister found copy of POA and will fax tomorrow. She requests DNR status and that gold form be placed on chart for d/c; gold DNR signed and on shadow chart. She knows SW is working on d/c plan.   She requested information on cost of thoracentesis as an outpatient. Explained that I cannot access that information. She states [facility] before this admit; she will look at those or call [employer] for hx of payment.  PMT RN will f/u tomorrow.  [name], RN, BSN, [facility] 02/04/2016 12:33 PM Cell [phone] 8:00-4:00 Monday-Friday Office [phone]

## 2016-02-04 NOTE — Progress Notes (Signed)
[facility] Progress Note  Patient: Justin Scott ZOX:[id number]   PCP: Kaleen Mask, MD DOB: 04/30/1950   DOA: 01/30/2016   DOS: 02/04/2016   Date of Service: the patient was seen and examined on 02/04/2016  Brief hospital course: Pt. with PMH of Cryptogenic cirrhosis, portal hypertension, hepatic encephalopathy; admitted on 01/30/2016, with complaint of confusion, was found to have mild worsening of hepatic encephalopathy.GI feels that patient's current presentation is likely secondary to noncompliance with medical regimen at home. Currently further plan is continue management, consult palliative care for goals of care discussion.  Assessment and Plan: 1. Acute encephalopathy Significant improvement and appears to be baseline. No asterixis on examination. ammonia level better Continue rifaximin, continue lactulose. Rifaximin dose increase per pharmacy.  2. Cryptogenic liver cirrhosis. Appreciate GI input. With high MELD score GI recommends to discussed with palliative care to decide goals of care. Consulted palliative care.Patient's CODE STATUS changed to DO NOT RESUSCITATE per discussion with palliative care and family. Hold oral Aldactone, change oral 40 mg twice a day Lasix to IV Lasix 40 mg. paracentesis for palliation GI today also recommends to check AFB as well as check for SBP. As the patient is unable to get paracentesis today I will order ceftriaxone for SBP prophylaxis.  3. Acute kidney injury. Hyponatremia, hyperkalemia Renal function worsening, patient will need albumin after the paracentesis. We'll continue to monitor. Holding on Aldactone in the setting of hyperkalemia. Recheck BMP. May need Kayexalate if remains elevated.  4. GERD. Continue PPI.  5. Anemia, normocytic anemia. Secondary to cirrhosis. We'll continue to monitor.  Pain management: When necessary Tylenol Activity: SNF per physical therapy Bowel regimen: last BM  02/02/2016 Diet: Low-salt diet DVT Prophylaxis: Subcutaneous heparin  Advance goals of care discussion: DNR/DNI  Family Communication: no family was present at bedside, at the time of interview.   Disposition:  Discharge to SNF. Expected discharge date: 02/06/2016  Consultants: Gastroenterology, palliative care Procedures: none  Antibiotics: Anti-infectives    Start     Dose/Rate Route Frequency Ordered Stop   02/01/16 2200  rifaximin (XIFAXAN) tablet 550 mg     550 mg Oral 2 times daily 02/01/16 1454     01/31/16 1600  rifaximin (XIFAXAN) tablet 550 mg  Status:  Discontinued     550 mg Oral 3 times daily 01/31/16 1230 02/01/16 1454   01/30/16 2200  rifaximin (XIFAXAN) tablet 200 mg  Status:  Discontinued     200 mg Oral 3 times daily 01/30/16 1829 01/31/16 1230     Subjective: Appears more tired as compared to yesterday, denies any nausea vomiting complains about abdominal distention.   Objective: Physical Exam: Vitals:   02/03/16 2058 02/04/16 0455 02/04/16 1435 02/04/16 1705  BP: 110/70 98/62 111/76 117/77  Pulse: 82 72 77   Resp: 18 18 16    Temp: 97.9 F (36.6 C) 98.6 F (37 C) 98 F (36.7 C)   TempSrc: Oral Oral Oral   SpO2: 99% 96% 100%   Weight:  88 kg (193 lb 14.4 oz)    Height:        Intake/Output Summary (Last 24 hours) at 02/04/16 1917 Last data filed at 02/04/16 1808  Gross per 24 hour  Intake              600 ml  Output              950 ml  Net             -350 ml  Filed Weights   02/02/16 0547 02/03/16 0527 02/04/16 0455  Weight: 87.2 kg (192 lb 3.2 oz) 88.5 kg (195 lb 1.7 oz) 88 kg (193 lb 14.4 oz)    General: Alert, Awake and Oriented to Time, Place and Person. Appear in mild distress, affect appropriate Eyes: PERRL, Conjunctiva normal ENT: Oral Mucosa clear moist. Neck: difficult to assess JVD, no Abnormal Mass Or lumps Cardiovascular: S1 and S2 Present, no Murmur, Respiratory: Bilateral Air entry equal and Decreased, no use of  accessory muscle, Clear to Auscultation, no Crackles, no wheezes Abdomen: Bowel Sound present, Soft and More distended, no tenderness Skin: no redness, no Rash, no induration Extremities: Bilateral Pedal edema, no calf tenderness Neurologic: Grossly no focal neuro deficit. Bilaterally Equal motor strength No asterixis  Data Reviewed: CBC:  Recent Labs Lab 01/29/16 1040 01/30/16 1525 01/31/16 0500 02/01/16 0536 02/03/16 0642  WBC 9.8 9.0 9.6 12.6* 11.4*  NEUTROABS  --  7.0  --  9.6* 8.7*  HGB 13.5 12.4* 12.3* 12.7* 12.7*  HCT 43.3 38.4* 38.3* 39.7 39.6  MCV 82.3 80.8 81.3 81.5 81.8  PLT 160 130* 127* 131* 102*   Basic Metabolic Panel:  Recent Labs Lab 01/30/16 1525 01/31/16 0500 02/01/16 0536 02/02/16 0841 02/03/16 0642 02/04/16 1128  NA 133* 134* 133* 130* 130* 126*  K 4.6 4.2 4.7 4.9 5.1 5.6*  CL 99* 102 100* 98* 97* 94*  CO2 25 24 24 25 25 22   GLUCOSE 168* 66 139* 149* 124* 207*  BUN 41* 37* 34* 32* 36* 38*  CREATININE 1.77* 1.45* 1.54* 1.44* 1.57* 1.55*  CALCIUM 8.6* 8.7* 8.8* 8.8* 8.7* 8.9  MG 2.2  --  2.1  --  2.0  --   PHOS 3.8  --   --   --   --   --     Liver Function Tests:  Recent Labs Lab 01/29/16 1040 01/30/16 1525 01/31/16 0500 02/01/16 0536 02/03/16 0642  AST 31 31 29  36 43*  ALT 16* 17 16* 17 15*  ALKPHOS 91 84 77 87 88  BILITOT 1.7* 1.2 1.4* 1.3* 1.9*  PROT 8.2* 6.9 6.9 6.6 6.5  ALBUMIN 2.5* 2.0* 1.9* 2.0* 1.9*    Recent Labs Lab 01/30/16 1525  LIPASE 75*    Recent Labs Lab 01/30/16 1525 02/02/16 0842  AMMONIA 119* 49*   Coagulation Profile:  Recent Labs Lab 01/29/16 1330 01/30/16 1525  INR 1.69 1.93   Cardiac Enzymes: No results for input(s): CKTOTAL, CKMB, CKMBINDEX, TROPONINI in the last 168 hours. BNP (last 3 results) No results for input(s): PROBNP in the last 8760 hours.  CBG:  Recent Labs Lab 02/03/16 1721 02/03/16 2144 02/04/16 0751 02/04/16 1203 02/04/16 1715  GLUCAP 252* 156* 137* 217* 164*    Studies: No results found.   Scheduled Meds: . furosemide  40 mg Intravenous Daily  . heparin subcutaneous  5,000 Units Subcutaneous Q8H  . insulin aspart  0-5 Units Subcutaneous QHS  . insulin aspart  0-9 Units Subcutaneous TID WC  . lactulose  20 g Oral BID  . pantoprazole  40 mg Oral Daily  . propranolol  10 mg Oral TID  . rifaximin  550 mg Oral BID  . sodium chloride flush  3 mL Intravenous Q12H   Continuous Infusions:  PRN Meds: sodium chloride, acetaminophen, sodium chloride flush  Time spent: 20 minutes  Author: Lynden OxfordPranav Patel, MD [facility] Pager: [phone] 02/04/2016 7:17 PM  If 7PM-7AM, please contact night-coverage at [email or web address], password [email or web address]

## 2016-02-05 ENCOUNTER — Ambulatory Visit (HOSPITAL_COMMUNITY): Payer: BC Managed Care – PPO

## 2016-02-05 LAB — BODY FLUID CELL COUNT WITH DIFFERENTIAL
Eos, Fluid: 0 %
Lymphs, Fluid: 62 %
Monocyte-Macrophage-Serous Fluid: 13 % — ABNORMAL LOW (ref 50–90)
NEUTROPHIL FLUID: 25 % (ref 0–25)
WBC FLUID: 668 uL (ref 0–1000)

## 2016-02-05 LAB — GLUCOSE, SEROUS FLUID: Glucose, Fluid: 201 mg/dL

## 2016-02-05 LAB — CBC WITH DIFFERENTIAL/PLATELET
BASOS ABS: 0 10*3/uL (ref 0.0–0.1)
BASOS PCT: 0 %
EOS ABS: 0.1 10*3/uL (ref 0.0–0.7)
Eosinophils Relative: 1 %
HCT: 39.2 % (ref 39.0–52.0)
HEMOGLOBIN: 12.9 g/dL — AB (ref 13.0–17.0)
LYMPHS ABS: 0.8 10*3/uL (ref 0.7–4.0)
LYMPHS PCT: 9 %
MCH: 27.1 pg (ref 26.0–34.0)
MCHC: 32.9 g/dL (ref 30.0–36.0)
MCV: 82.4 fL (ref 78.0–100.0)
MONO ABS: 1.3 10*3/uL — AB (ref 0.1–1.0)
Monocytes Relative: 14 %
NEUTROS ABS: 7.1 10*3/uL (ref 1.7–7.7)
Neutrophils Relative %: 76 %
Platelets: 99 10*3/uL — ABNORMAL LOW (ref 150–400)
RBC: 4.76 MIL/uL (ref 4.22–5.81)
WBC: 9.3 10*3/uL (ref 4.0–10.5)

## 2016-02-05 LAB — GRAM STAIN

## 2016-02-05 LAB — COMPREHENSIVE METABOLIC PANEL
ALBUMIN: 2 g/dL — AB (ref 3.5–5.0)
ALK PHOS: 89 U/L (ref 38–126)
ALT: 18 U/L (ref 17–63)
ANION GAP: 9 (ref 5–15)
AST: 28 U/L (ref 15–41)
BUN: 38 mg/dL — ABNORMAL HIGH (ref 6–20)
CALCIUM: 8.8 mg/dL — AB (ref 8.9–10.3)
CO2: 24 mmol/L (ref 22–32)
Chloride: 96 mmol/L — ABNORMAL LOW (ref 101–111)
Creatinine, Ser: 1.34 mg/dL — ABNORMAL HIGH (ref 0.61–1.24)
GFR calc non Af Amer: 55 mL/min — ABNORMAL LOW (ref >60)
Glucose, Bld: 151 mg/dL — ABNORMAL HIGH (ref 65–99)
POTASSIUM: 4.9 mmol/L (ref 3.5–5.1)
SODIUM: 129 mmol/L — AB (ref 135–145)
TOTAL PROTEIN: 6.6 g/dL (ref 6.5–8.1)
Total Bilirubin: 1.4 mg/dL — ABNORMAL HIGH (ref 0.3–1.2)

## 2016-02-05 LAB — AFP TUMOR MARKER: AFP TUMOR MARKER: 1.9 ng/mL (ref 0.0–8.3)

## 2016-02-05 LAB — GLUCOSE, CAPILLARY
GLUCOSE-CAPILLARY: 151 mg/dL — AB (ref 65–99)
GLUCOSE-CAPILLARY: 183 mg/dL — AB (ref 65–99)
GLUCOSE-CAPILLARY: 229 mg/dL — AB (ref 65–99)
GLUCOSE-CAPILLARY: 253 mg/dL — AB (ref 65–99)

## 2016-02-05 LAB — MAGNESIUM: Magnesium: 2.1 mg/dL (ref 1.7–2.4)

## 2016-02-05 LAB — ALBUMIN, FLUID (OTHER): Albumin, Fluid: 1 g/dL

## 2016-02-05 LAB — PHOSPHORUS: PHOSPHORUS: 3.3 mg/dL (ref 2.5–4.6)

## 2016-02-05 LAB — LACTATE DEHYDROGENASE, PLEURAL OR PERITONEAL FLUID: LD, Fluid: 47 U/L — ABNORMAL HIGH (ref 3–23)

## 2016-02-05 LAB — PROTIME-INR
INR: 1.81
PROTHROMBIN TIME: 21.2 s — AB (ref 11.4–15.2)

## 2016-02-05 LAB — PROTEIN, BODY FLUID: Total protein, fluid: <3 g/dL

## 2016-02-05 MED ORDER — LIDOCAINE HCL 1 % IJ SOLN
INTRAMUSCULAR | Status: AC
  Filled 2016-02-05: qty 20

## 2016-02-05 MED ORDER — ALBUMIN HUMAN 25 % IV SOLN
50.0000 g | Freq: Four times a day (QID) | INTRAVENOUS | Status: AC
  Administered 2016-02-05 – 2016-02-06 (×2): 50 g via INTRAVENOUS
  Filled 2016-02-05: qty 150
  Filled 2016-02-05: qty 50
  Filled 2016-02-05: qty 150
  Filled 2016-02-05: qty 50

## 2016-02-05 MED ORDER — SPIRONOLACTONE 25 MG PO TABS
50.0000 mg | ORAL_TABLET | Freq: Every day | ORAL | Status: DC
  Administered 2016-02-05 – 2016-02-07 (×3): 50 mg via ORAL
  Filled 2016-02-05 (×3): qty 2

## 2016-02-05 MED ORDER — DEXTROSE 5 % IV SOLN
2.0000 g | INTRAVENOUS | Status: DC
  Filled 2016-02-05: qty 2

## 2016-02-05 NOTE — Progress Notes (Signed)
Palliative Medicine RN Note: Sister faxed MOST and HCPOA forms, which are now on the shadow chart. She indicated that she wants pt to have comfort care, including antibiotics for infections. Discussed paracentesis as a comfort measure, and she definitely wants to proceed again when GI feels pt is ready for it.   Margret ChanceMelanie G. Terrina Docter, RN, BSN, [facility] 02/05/2016 1:08 PM Cell [phone] 8:00-4:00 Monday-Friday Office [phone]

## 2016-02-05 NOTE — Progress Notes (Signed)
PT Cancellation Note  Patient Details Name: Justin PatriciaClifton H Murch MRN: [id number] DOB: January 12, 1951   Cancelled Treatment:    Reason Eval/Treat Not Completed: Patient at procedure or test/unavailable PT will check on pt later as time allows.    Derek MoundKellyn R Haeley Fordham Leyton Brownlee, PTA Pager: [phone]   02/05/2016, 2:39 PM

## 2016-02-05 NOTE — Progress Notes (Addendum)
[facility] Progress Note  Patient: Justin Scott Delellis ZOX:[id number]   PCP: Kaleen MaskELKINS,WILSON OLIVER, MD DOB: Jun 01, 1950   DOA: 01/30/2016   DOS: 02/05/2016   Date of Service: the patient was seen and examined on 02/05/2016  Brief hospital course: Pt. with PMH of Cryptogenic cirrhosis, portal hypertension, hepatic encephalopathy; admitted on 01/30/2016, with complaint of confusion, was found to have mild worsening of hepatic encephalopathy.GI feels that patient's current presentation is likely secondary to noncompliance with medical regimen at home. Currently further plan is continue management, consult palliative care for goals of care discussion.  Assessment and Plan: 1. Acute encephalopathy Significant improvement and appears to be baseline. No asterixis on examination. ammonia level better Continue rifaximin, continue lactulose. Rifaximin dose increase per pharmacy.  2. Cryptogenic liver cirrhosis. Appreciate GI input. With high MELD score GI recommends to discussed with palliative care to decide goals of care. Consulted palliative care.Patient's CODE STATUS changed to DO NOT RESUSCITATE per discussion with palliative care and family. Hold oral Aldactone, paracentesis for palliation GI recommends to check AFP as well as check for SBP. Per GI on ceftriaxone for SBP prophylaxis.  3. Acute kidney injury. Hyponatremia, hyperkalemia Renal function worsening, patient will need albumin after the paracentesis. We'll continue to monitor. Holding on Aldactone in the setting of hyperkalemia. Giving IV albumin, 50 x2.  4. GERD. Continue PPI.  5. Anemia, normocytic anemia. Secondary to cirrhosis. We'll continue to monitor.  Pain management: When necessary Tylenol Activity: SNF per physical therapy Bowel regimen: last BM 02/02/2016 Diet: Low-salt diet DVT Prophylaxis: Subcutaneous heparin  Advance goals of care discussion: DNR/DNI, palliative care notes mentions comfort care per  family although no orders yet. Will discuss with them in AM. Likely SNF with palliative care support will be ideal for pt.  Family Communication: no family was present at bedside, at the time of interview.   Disposition:  Discharge to SNF. Expected discharge date: 02/06/2016  Consultants: Gastroenterology, palliative care Procedures: none  Antibiotics: Anti-infectives    Start     Dose/Rate Route Frequency Ordered Stop   02/05/16 1930  cefTRIAXone (ROCEPHIN) 2 g in dextrose 5 % 50 mL IVPB  Status:  Discontinued     2 g 100 mL/hr over 30 Minutes Intravenous Every 24 hours 02/05/16 0948 02/05/16 1409   02/04/16 1930  cefTRIAXone (ROCEPHIN) 1 g in dextrose 5 % 50 mL IVPB  Status:  Discontinued     1 g 100 mL/hr over 30 Minutes Intravenous Every 24 hours 02/04/16 1918 02/05/16 0948   02/01/16 2200  rifaximin (XIFAXAN) tablet 550 mg     550 mg Oral 2 times daily 02/01/16 1454     01/31/16 1600  rifaximin (XIFAXAN) tablet 550 mg  Status:  Discontinued     550 mg Oral 3 times daily 01/31/16 1230 02/01/16 1454   01/30/16 2200  rifaximin (XIFAXAN) tablet 200 mg  Status:  Discontinued     200 mg Oral 3 times daily 01/30/16 1829 01/31/16 1230     Subjective: pt c/o abdominal pain, shortness of breath  Objective: Physical Exam: Vitals:   02/05/16 1513 02/05/16 1518 02/05/16 1521 02/05/16 1531  BP: 101/70 104/68 108/70 109/71  Pulse:      Resp:      Temp:      TempSrc:      SpO2:      Weight:      Height:        Intake/Output Summary (Last 24 hours) at 02/05/16 1958 Last data filed at  02/05/16 1809  Gross per 24 hour  Intake              293 ml  Output             1150 ml  Net             -857 ml   Filed Weights   02/03/16 0527 02/04/16 0455 02/05/16 0551  Weight: 88.5 kg (195 lb 1.7 oz) 88 kg (193 lb 14.4 oz) 65.3 kg (143 lb 15.4 oz)    General: Alert, Awake and Oriented to Time, Place and Person. Appear in mild distress, affect appropriate Eyes: PERRL, Conjunctiva  normal ENT: Oral Mucosa clear moist. Neck: difficult to assess JVD, no Abnormal Mass Or lumps Cardiovascular: S1 and S2 Present, no Murmur, Respiratory: Bilateral Air entry equal and Decreased, no use of accessory muscle, Clear to Auscultation, no Crackles, no wheezes Abdomen: Bowel Sound present, Soft and More distended, no tenderness Skin: no redness, no Rash, no induration Extremities: Bilateral Pedal edema, no calf tenderness Neurologic: Grossly no focal neuro deficit. Bilaterally Equal motor strength No asterixis  Data Reviewed: CBC:  Recent Labs Lab 01/30/16 1525 01/31/16 0500 02/01/16 0536 02/03/16 0642 02/05/16 0417  WBC 9.0 9.6 12.6* 11.4* 9.3  NEUTROABS 7.0  --  9.6* 8.7* 7.1  HGB 12.4* 12.3* 12.7* 12.7* 12.9*  HCT 38.4* 38.3* 39.7 39.6 39.2  MCV 80.8 81.3 81.5 81.8 82.4  PLT 130* 127* 131* 102* 99*   Basic Metabolic Panel:  Recent Labs Lab 01/30/16 1525  02/01/16 0536 02/02/16 0841 02/03/16 0642 02/04/16 1128 02/04/16 1928 02/05/16 0417  NA 133*  < > 133* 130* 130* 126* 125* 129*  K 4.6  < > 4.7 4.9 5.1 5.6* 5.1 4.9  CL 99*  < > 100* 98* 97* 94* 95* 96*  CO2 25  < > 24 25 25 22 22 24   GLUCOSE 168*  < > 139* 149* 124* 207* 226* 151*  BUN 41*  < > 34* 32* 36* 38* 40* 38*  CREATININE 1.77*  < > 1.54* 1.44* 1.57* 1.55* 1.58* 1.34*  CALCIUM 8.6*  < > 8.8* 8.8* 8.7* 8.9 8.6* 8.8*  MG 2.2  --  2.1  --  2.0  --   --  2.1  PHOS 3.8  --   --   --   --   --   --  3.3  < > = values in this interval not displayed.  Liver Function Tests:  Recent Labs Lab 01/30/16 1525 01/31/16 0500 02/01/16 0536 02/03/16 0642 02/05/16 0417  AST 31 29 36 43* 28  ALT 17 16* 17 15* 18  ALKPHOS 84 77 87 88 89  BILITOT 1.2 1.4* 1.3* 1.9* 1.4*  PROT 6.9 6.9 6.6 6.5 6.6  ALBUMIN 2.0* 1.9* 2.0* 1.9* 2.0*    Recent Labs Lab 01/30/16 1525  LIPASE 75*    Recent Labs Lab 01/30/16 1525 02/02/16 0842  AMMONIA 119* 49*   Coagulation Profile:  Recent Labs Lab  01/30/16 1525 02/05/16 0417  INR 1.93 1.81   Cardiac Enzymes: No results for input(s): CKTOTAL, CKMB, CKMBINDEX, TROPONINI in the last 168 hours. BNP (last 3 results) No results for input(s): PROBNP in the last 8760 hours.  CBG:  Recent Labs Lab 02/04/16 1715 02/04/16 2157 02/05/16 0802 02/05/16 1210 02/05/16 1655  GLUCAP 164* 250* 151* 183* 229*   Studies: US Paracentesis  Result Date: 02/05/2016 INDICATION: Patient with NASH and recurrent abdominal ascites. Request is made for diagnostic and  therapeutic paracentesis. EXAM: ULTRASOUND GUIDED DIAGNOSTIC AND THERAPEUTIC PARACENTESIS MEDICATIONS: 1% lidocaine. COMPLICATIONS: None immediate. PROCEDURE: Informed written consent was obtained from the patient after a discussion of the risks, benefits and alternatives to treatment. A timeout was performed prior to the initiation of the procedure. Initial ultrasound scanning demonstrates a large amount of ascites within the right lower abdominal quadrant. The right lower abdomen was prepped and draped in the usual sterile fashion. 1% lidocaine was used for local anesthesia. Following this, a 19 gauge, 7-cm, Yueh catheter was introduced. An ultrasound image was saved for documentation purposes. The paracentesis was performed. The catheter was removed and a dressing was applied. The patient tolerated the procedure well without immediate post procedural complication. FINDINGS: A total of approximately 7 L of chylous colored fluid was removed. Samples were sent to the laboratory as requested by the clinical team. IMPRESSION: Successful ultrasound-guided paracentesis yielding 7 liters of peritoneal fluid. Read by: Barnetta Chapel, PA-C Electronically Signed   By: Gilmer Mor D.O.   On: 02/05/2016 15:53     Scheduled Meds: . albumin human  50 g Intravenous Q6H  . furosemide  40 mg Intravenous Daily  . heparin subcutaneous  5,000 Units Subcutaneous Q8H  . insulin aspart  0-5 Units Subcutaneous QHS   . insulin aspart  0-9 Units Subcutaneous TID WC  . lactulose  20 g Oral BID  . pantoprazole  40 mg Oral Daily  . propranolol  10 mg Oral TID  . rifaximin  550 mg Oral BID  . sodium chloride flush  3 mL Intravenous Q12H  . spironolactone  50 mg Oral Daily   Continuous Infusions:  PRN Meds: sodium chloride, acetaminophen, sodium chloride flush  Time spent: 20 minutes  Author: Lynden Oxford, MD [facility] Pager: [phone] 02/05/2016 7:58 PM  If 7PM-7AM, please contact night-coverage at [email or web address], password [facility]

## 2016-02-05 NOTE — Procedures (Signed)
Ultrasound-guided diagnostic and therapeutic paracentesis performed yielding 7 liters of chylous colored fluid. No immediate complications.  Justin Scott E 3:52 PM 02/05/2016

## 2016-02-05 NOTE — Progress Notes (Signed)
CSW spoke to patient's sister regarding discharge placement. Patient's insurance is not authorizing him to go to snf and patient cannot afford to pay privately. CSW supervisor is looking into providing patient with an log to [facility]. [facility] for [facility].  Osborne Cascoadia Geniene List LCSWA [phone]

## 2016-02-05 NOTE — Progress Notes (Signed)
     [facility] Progress Note  Subjective: sleepy but not complaints  Objective:  Vital signs in last 24 hours: Temp:  [97.7 F (36.5 C)-98 F (36.7 C)] 97.7 F (36.5 C) (10/17 0551) Pulse Rate:  [71-81] 76 (10/17 0957) Resp:  [16-18] 18 (10/17 0551) BP: (94-124)/(55-85) 112/78 (10/17 0957) SpO2:  [97 %-100 %] 98 % (10/17 0551) Weight:  [143 lb 15.4 oz (65.3 kg)] 143 lb 15.4 oz (65.3 kg) (10/17 0551) Last BM Date: 02/03/16 General:   Lethargic, obese white male in NAD Heart:  Regular rate and rhythm; no murmurs Pulm;  Decreased breath sounds at both bases.  Abdomen:  Soft, mod-large distention, nontender . Normal bowel sounds   Extremities:  2+ BLE edema. Neuro: lethargic but oriented to place and time Follow commands. No asterixis.   Intake/Output from previous day: 10/16 0701 - 10/17 0700 In: 293 [P.O.:240; I.V.:3; IV Piggyback:50] Out: 950 [Urine:950]  Lab Results:  Recent Labs  02/03/16 0642 02/05/16 0417  WBC 11.4* 9.3  HGB 12.7* 12.9*  HCT 39.6 39.2  PLT 102* 99*   BMET  Recent Labs  02/04/16 1128 02/04/16 1928 02/05/16 0417  NA 126* 125* 129*  K 5.6* 5.1 4.9  CL 94* 95* 96*  CO2 22 22 24   GLUCOSE 207* 226* 151*  BUN 38* 40* 38*  CREATININE 1.55* 1.58* 1.34*  CALCIUM 8.9 8.6* 8.8*   LFT  Recent Labs  02/05/16 0417  PROT 6.6  ALBUMIN 2.0*  AST 28  ALT 18  ALKPHOS 89  BILITOT 1.4*   PT/INR  Recent Labs  02/05/16 0417  LABPROT 21.2*  INR 1.81    Assessment / Plan: 11.  65 year old male with decompensated NASH cirrhosis with encephalopathy and ascites. Progressive ascites despite diuretics (if compliant). He had 4 liters removed on 01/22/16 followed by 5 liters 01/29/16.  -continue xifaxan, lactulose -For repeat LVP today -renal function improving.  -continue lasix 40mg  IV daily.  Was on aldactone 100mg  daily, will start back at 50mg  daily for now as potassium upper limits of normal. He is diuresing with -3,686 ml  fluid balance. His weigh is down but doubtful of the 50 pound loss since 01/1616 based on EPIC weights.   -normal AFP  2. Hx of SBP on 01/22/16 fluid studies. Currently on Rocephin.  -Renal function improving 1.58 ---->1.34.   3. Hyperkalemia. Aldactone held yesterday. Potassium 5.6 ----> 4.9 today.   4. DNR    LOS: 4 days   Willette ClusterPaula Kerrilynn Derenzo  02/05/2016, 10:58 AM  Pager number [phone]

## 2016-02-06 LAB — CBC WITH DIFFERENTIAL/PLATELET
BASOS ABS: 0 10*3/uL (ref 0.0–0.1)
Basophils Relative: 0 %
EOS PCT: 1 %
Eosinophils Absolute: 0.1 10*3/uL (ref 0.0–0.7)
HEMATOCRIT: 33.9 % — AB (ref 39.0–52.0)
HEMOGLOBIN: 11 g/dL — AB (ref 13.0–17.0)
LYMPHS ABS: 0.7 10*3/uL (ref 0.7–4.0)
LYMPHS PCT: 10 %
MCH: 26.6 pg (ref 26.0–34.0)
MCHC: 32.4 g/dL (ref 30.0–36.0)
MCV: 82.1 fL (ref 78.0–100.0)
MONOS PCT: 13 %
Monocytes Absolute: 0.9 10*3/uL (ref 0.1–1.0)
NEUTROS ABS: 5.5 10*3/uL (ref 1.7–7.7)
Neutrophils Relative %: 76 %
Platelets: 70 10*3/uL — ABNORMAL LOW (ref 150–400)
RBC: 4.13 MIL/uL — AB (ref 4.22–5.81)
WBC: 7.2 10*3/uL (ref 4.0–10.5)

## 2016-02-06 LAB — RENAL FUNCTION PANEL
ANION GAP: 8 (ref 5–15)
Albumin: 3.1 g/dL — ABNORMAL LOW (ref 3.5–5.0)
BUN: 36 mg/dL — AB (ref 6–20)
CHLORIDE: 97 mmol/L — AB (ref 101–111)
CO2: 23 mmol/L (ref 22–32)
Calcium: 9 mg/dL (ref 8.9–10.3)
Creatinine, Ser: 1.29 mg/dL — ABNORMAL HIGH (ref 0.61–1.24)
GFR calc Af Amer: >60 mL/min (ref >60)
GFR calc non Af Amer: 58 mL/min — ABNORMAL LOW (ref >60)
GLUCOSE: 215 mg/dL — AB (ref 65–99)
PHOSPHORUS: 3 mg/dL (ref 2.5–4.6)
POTASSIUM: 4.6 mmol/L (ref 3.5–5.1)
Sodium: 128 mmol/L — ABNORMAL LOW (ref 135–145)

## 2016-02-06 LAB — GLUCOSE, CAPILLARY
GLUCOSE-CAPILLARY: 176 mg/dL — AB (ref 65–99)
GLUCOSE-CAPILLARY: 212 mg/dL — AB (ref 65–99)
Glucose-Capillary: 149 mg/dL — ABNORMAL HIGH (ref 65–99)
Glucose-Capillary: 276 mg/dL — ABNORMAL HIGH (ref 65–99)

## 2016-02-06 MED ORDER — FUROSEMIDE 40 MG PO TABS
40.0000 mg | ORAL_TABLET | Freq: Two times a day (BID) | ORAL | Status: DC
Start: 2016-02-06 — End: 2016-02-07
  Administered 2016-02-06 – 2016-02-07 (×3): 40 mg via ORAL
  Filled 2016-02-06 (×3): qty 1

## 2016-02-06 NOTE — Progress Notes (Signed)
PT Cancellation Note  Patient Details Name: Albertine PatriciaClifton H Sneeringer MRN: [id number] DOB: [birth year]   Cancelled Treatment:    Reason Eval/Treat Not Completed: Patient declined, no reason specified. Pt declined due to fatigue. Pt would like PT to check back tomorrow.    Derek MoundKellyn R Ulis Kaps Nayson Traweek, PTA Pager: [phone]   02/06/2016, 2:56 PM

## 2016-02-06 NOTE — Progress Notes (Signed)
[facility] Progress Note  Patient: Justin Scott ZOX:[id number]   PCP: Kaleen Mask, MD DOB: 29-Sep-1950   DOA: 01/30/2016   DOS: 02/06/2016   Date of Service: the patient was seen and examined on 02/06/2016  Brief hospital course: Pt. with PMH of Cryptogenic cirrhosis, portal hypertension, hepatic encephalopathy; admitted on 01/30/2016, with complaint of confusion, was found to have mild worsening of hepatic encephalopathy.GI feels that patient's current presentation is likely secondary to noncompliance with medical regimen at home. Currently further plan is continue management, palliative care following  Assessment and Plan: 1. Acute hepatic and metabolic encephalopathy -mentation improved -ammonia level better -Continue rifaximin, continue lactulose  2. Cryptogenic liver cirrhosis -Appreciate GI input. -With high MELD score GI recommended palliative care for goals of care. -Consulted palliative care, now DNR and plan for supportive/comfort based care at this time -s/p paracentesis for comfort -continue ceftriaxone for SBP prophylaxis. -residential hospice being considered  3. Acute kidney injury/Hyponatremia/ hyperkalemia -improving and stable now -aldactone on hold -s/p paracentesis and given albumin  4. GERD. Continue PPI.  5. Anemia, normocytic anemia. -stable  6. Thombocytopenia -stable, monitor  DVT Prophylaxis: Subcutaneous heparin  Advance goals of care discussion: DNR/DNI,  Family Communication: no family was present at bedside, at the time of interview.  Disposition: Residential hospice soon if possible  Consultants: Gastroenterology, palliative care Procedures: none  Antibiotics: Anti-infectives    Start     Dose/Rate Route Frequency Ordered Stop   02/05/16 1930  cefTRIAXone (ROCEPHIN) 2 g in dextrose 5 % 50 mL IVPB  Status:  Discontinued     2 g 100 mL/hr over 30 Minutes Intravenous Every 24 hours 02/05/16 0948 02/05/16 1409   02/04/16 1930  cefTRIAXone (ROCEPHIN) 1 g in dextrose 5 % 50 mL IVPB  Status:  Discontinued     1 g 100 mL/hr over 30 Minutes Intravenous Every 24 hours 02/04/16 1918 02/05/16 0948   02/01/16 2200  rifaximin (XIFAXAN) tablet 550 mg     550 mg Oral 2 times daily 02/01/16 1454     01/31/16 1600  rifaximin (XIFAXAN) tablet 550 mg  Status:  Discontinued     550 mg Oral 3 times daily 01/31/16 1230 02/01/16 1454   01/30/16 2200  rifaximin (XIFAXAN) tablet 200 mg  Status:  Discontinued     200 mg Oral 3 times daily 01/30/16 1829 01/31/16 1230     Subjective: pt c/o abdominal pain, shortness of breath  Objective: Physical Exam: Vitals:   02/06/16 0550 02/06/16 0734 02/06/16 0751 02/06/16 1021  BP: (!) 91/48 (!) 105/57  (!) 106/58  Pulse: 73 73  75  Resp: 18 18  18   Temp: 97.9 F (36.6 C) 98 F (36.7 C)  98.1 F (36.7 C)  TempSrc: Oral Oral  Oral  SpO2: 97% 97%  100%  Weight:  81.1 kg (178 lb 12.7 oz) 81.1 kg (178 lb 12.7 oz)   Height:        Intake/Output Summary (Last 24 hours) at 02/06/16 1550 Last data filed at 02/06/16 1009  Gross per 24 hour  Intake              400 ml  Output              650 ml  Net             -250 ml   Filed Weights   02/06/16 0500 02/06/16 0734 02/06/16 0751  Weight: 75.7 kg (166 lb 14.2 oz) 81.1 kg (178 lb  12.7 oz) 81.1 kg (178 lb 12.7 oz)    General: Alert, Awake and Oriented to Time, Place and Person. Appear in mild distress, affect appropriate Eyes: PERRL, Conjunctiva normal ENT: Oral Mucosa clear moist. Neck: difficult to assess JVD, no Abnormal Mass Or lumps Cardiovascular: S1 and S2 Present, no Murmur, Respiratory: Bilateral Air entry equal and Decreased, no use of accessory muscle, Clear to Auscultation, no Crackles, no wheezes Abdomen: Bowel Sound present, Soft and More distended, no tenderness Skin: no redness, no Rash, no induration Extremities: Bilateral Pedal edema, no calf tenderness Neurologic: Grossly no focal neuro deficit.  Bilaterally Equal motor strength No asterixis  Data Reviewed: CBC:  Recent Labs Lab 01/31/16 0500 02/01/16 0536 02/03/16 0642 02/05/16 0417 02/06/16 0629  WBC 9.6 12.6* 11.4* 9.3 7.2  NEUTROABS  --  9.6* 8.7* 7.1 5.5  HGB 12.3* 12.7* 12.7* 12.9* 11.0*  HCT 38.3* 39.7 39.6 39.2 33.9*  MCV 81.3 81.5 81.8 82.4 82.1  PLT 127* 131* 102* 99* 70*   Basic Metabolic Panel:  Recent Labs Lab 02/01/16 0536  02/03/16 0642 02/04/16 1128 02/04/16 1928 02/05/16 0417 02/06/16 0528  NA 133*  < > 130* 126* 125* 129* 128*  K 4.7  < > 5.1 5.6* 5.1 4.9 4.6  CL 100*  < > 97* 94* 95* 96* 97*  CO2 24  < > 25 22 22 24 23   GLUCOSE 139*  < > 124* 207* 226* 151* 215*  BUN 34*  < > 36* 38* 40* 38* 36*  CREATININE 1.54*  < > 1.57* 1.55* 1.58* 1.34* 1.29*  CALCIUM 8.8*  < > 8.7* 8.9 8.6* 8.8* 9.0  MG 2.1  --  2.0  --   --  2.1  --   PHOS  --   --   --   --   --  3.3 3.0  < > = values in this interval not displayed.  Liver Function Tests:  Recent Labs Lab 01/31/16 0500 02/01/16 0536 02/03/16 0642 02/05/16 0417 02/06/16 0528  AST 29 36 43* 28  --   ALT 16* 17 15* 18  --   ALKPHOS 77 87 88 89  --   BILITOT 1.4* 1.3* 1.9* 1.4*  --   PROT 6.9 6.6 6.5 6.6  --   ALBUMIN 1.9* 2.0* 1.9* 2.0* 3.1*   No results for input(s): LIPASE, AMYLASE in the last 168 hours.  Recent Labs Lab 02/02/16 0842  AMMONIA 49*   Coagulation Profile:  Recent Labs Lab 02/05/16 0417  INR 1.81   Cardiac Enzymes: No results for input(s): CKTOTAL, CKMB, CKMBINDEX, TROPONINI in the last 168 hours. BNP (last 3 results) No results for input(s): PROBNP in the last 8760 hours.  CBG:  Recent Labs Lab 02/05/16 1210 02/05/16 1655 02/05/16 2101 02/06/16 0747 02/06/16 1154  GLUCAP 183* 229* 253* 149* 176*   Studies: [place] Paracentesis  Result Date: 02/05/2016 INDICATION: Patient with NASH and recurrent abdominal ascites. Request is made for diagnostic and therapeutic paracentesis. EXAM: ULTRASOUND GUIDED  DIAGNOSTIC AND THERAPEUTIC PARACENTESIS MEDICATIONS: 1% lidocaine. COMPLICATIONS: None immediate. PROCEDURE: Informed written consent was obtained from the patient after a discussion of the risks, benefits and alternatives to treatment. A timeout was performed prior to the initiation of the procedure. Initial ultrasound scanning demonstrates a large amount of ascites within the right lower abdominal quadrant. The right lower abdomen was prepped and draped in the usual sterile fashion. 1% lidocaine was used for local anesthesia. Following this, a 19 gauge, 7-cm, Yueh catheter was  introduced. An ultrasound image was saved for documentation purposes. The paracentesis was performed. The catheter was removed and a dressing was applied. The patient tolerated the procedure well without immediate post procedural complication. FINDINGS: A total of approximately 7 L of chylous colored fluid was removed. Samples were sent to the laboratory as requested by the clinical team. IMPRESSION: Successful ultrasound-guided paracentesis yielding 7 liters of peritoneal fluid. Read by: Barnetta Chapel, PA-C Electronically Signed   By: Gilmer Mor D.O.   On: 02/05/2016 15:53     Scheduled Meds: . furosemide  40 mg Oral BID  . heparin subcutaneous  5,000 Units Subcutaneous Q8H  . insulin aspart  0-5 Units Subcutaneous QHS  . insulin aspart  0-9 Units Subcutaneous TID WC  . lactulose  20 g Oral BID  . pantoprazole  40 mg Oral Daily  . propranolol  10 mg Oral TID  . rifaximin  550 mg Oral BID  . sodium chloride flush  3 mL Intravenous Q12H  . spironolactone  50 mg Oral Daily   Continuous Infusions:  PRN Meds: sodium chloride, acetaminophen, sodium chloride flush  Time spent: 30 minutes  Eduard Roux Hospitalist [phone] 02/06/2016 3:50 PM  If 7PM-7AM, please contact night-coverage at [email or web address], password [facility]

## 2016-02-06 NOTE — Progress Notes (Signed)
Palliative Medicine RN Note: [facility]; [facility] but would be eligible for hospice at home after d/c. Will need to proceed with LOG (SW has already initiated this).  Notified sister via email at her request.  Margret ChanceMelanie G. Landers Prajapati, RN, BSN, [facility] 02/06/2016 3:59 PM Cell [phone] 8:00-4:00 Monday-Friday Office [phone]

## 2016-02-06 NOTE — Progress Notes (Addendum)
Palliative Medicine RN Note: Per discussion with sister yesterday, please email her if we need to reach her non-emergently, as she is not allowed to have personal calls at work.  Justin Scott; [email or web address]   [facility] to introduce hospice support at the nursing facility [facility]; she was very clear that comfort is the goal. If she is agreeable, pt will need an order for hospice referral and admit upon admission to [facility].  Discussed case with Dr Rosalin HawkingZeba Anwar. Pt is only eating 50% or less of meals and is hypotensive. When this is in the setting of rapidly re-accumulating ascites, she feels prognosis is very grim, likely less than 2 weeks. Family's goal is comfort. Placement is an issue due to CG issues and the financial burden of nursing home placement. Dr Linna DarnerAnwar recommends evaluation by inpt hospice, as he is very near EOL. Per our discussion, pt would benefit from drain placement so hospice could drain ascites after d/c; this will prevent readmissions and manage symptoms outpt.  Offered choice to sister via email. [place]. Order placed for evaluation by [facility].   Margret ChanceMelanie G. Nari Vannatter, RN, BSN, [facility] 02/06/2016 8:45 AM Cell [phone] 8:00-4:00 Monday-Friday Office [phone]

## 2016-02-07 LAB — RENAL FUNCTION PANEL
ALBUMIN: 2.9 g/dL — AB (ref 3.5–5.0)
Anion gap: 6 (ref 5–15)
BUN: 31 mg/dL — AB (ref 6–20)
CALCIUM: 8.7 mg/dL — AB (ref 8.9–10.3)
CO2: 24 mmol/L (ref 22–32)
CREATININE: 1.23 mg/dL (ref 0.61–1.24)
Chloride: 99 mmol/L — ABNORMAL LOW (ref 101–111)
GFR calc Af Amer: >60 mL/min (ref >60)
GLUCOSE: 126 mg/dL — AB (ref 65–99)
PHOSPHORUS: 2.7 mg/dL (ref 2.5–4.6)
Potassium: 4.6 mmol/L (ref 3.5–5.1)
SODIUM: 129 mmol/L — AB (ref 135–145)

## 2016-02-07 LAB — GLUCOSE, CAPILLARY
GLUCOSE-CAPILLARY: 141 mg/dL — AB (ref 65–99)
Glucose-Capillary: 198 mg/dL — ABNORMAL HIGH (ref 65–99)
Glucose-Capillary: 197 mg/dL — ABNORMAL HIGH (ref 65–99)

## 2016-02-07 LAB — BASIC METABOLIC PANEL: Glucose: 290 mg/dL

## 2016-02-07 MED ORDER — LACTULOSE 10 GM/15ML PO SOLN: 20.0000 g | Freq: Two times a day (BID) | ORAL | 0 refills | Status: DC

## 2016-02-07 NOTE — Progress Notes (Signed)
PT Cancellation Note  Patient Details Name: Justin Scott MRN: [id number] DOB: 09-07-1950   Cancelled Treatment:    Reason Eval/Treat Not Completed: Other (comment) (Pt is being evaluated for comfort measures, declines today).  Make a decision as to whether he is still in need of PT tomorrow.  Continues to state he might want to try another time.   [name], [name] 02/07/2016, 10:10 AM    [name], PT MS Acute Rehab Dept. Number: [facility] [phone] and [facility] [phone]

## 2016-02-07 NOTE — Discharge Summary (Signed)
Physician Discharge Summary  Justin Scott RUE:[id number] DOB: [birth year] DOA: 01/30/2016  PCP: Kaleen MaskELKINS,WILSON OLIVER, MD  Admit date: 01/30/2016 Discharge date: 02/07/2016  Time spent: 45 minutes  Recommendations for Outpatient Follow-up:  To SNF for Hospice/End of life care, please call Hospice for Comfort care at SNF   Discharge Diagnoses:  Principal Problem:   Acute encephalopathy Active Problems:   Hepatic cirrhosis (HCC)   Thrombocytopenia (HCC)   Portal hypertension (HCC)   Nonadherence to medical treatment   Acute kidney injury (HCC)   Hyponatremia   Cirrhosis of liver with ascites (HCC)   Encephalopathy, hepatic (HCC)   Palliative care encounter   Hepatic encephalopathy [facility](HCC)   Discharge Condition: stable  Diet recommendation: comfort feeds  Filed Weights   02/06/16 0734 02/06/16 0751 02/06/16 2114  Weight: 81.1 kg (178 lb 12.7 oz) 81.1 kg (178 lb 12.7 oz) 77.5 kg (170 lb 13.7 oz)    History of present illness:  Pt. with PMH of Cryptogenic cirrhosis, portal hypertension, hepatic encephalopathy; admitted on 01/30/2016, with complaint of confusion, was found to have mild worsening of hepatic encephalopathy  Hospital Course:  1. Acute hepatic and metabolic encephalopathy -mentation improved -ammonia level better -on rifaximin, lactulose, no comfort care, continue lactulose as tolerated  2. Cryptogenic liver cirrhosis -Appreciate GI input. -With high MELD score, GI recommended palliative consult for goals of care. -Consulted palliative care, s/p family meeting, now DNR and plan for Hospice/Comfort based care only at this time -s/p paracentesis for comfort and 7L drained -plan for discharge to SNF for Hospice/End of life care  3. Acute kidney injury/Hyponatremia/ hyperkalemia -improving and stable now -aldactone resumed -s/p paracentesis and given albumin  4. GERD. Continue PPI.  5. Anemia, normocytic anemia. -stable  6.  Thombocytopenia -stable, monitor  Discharge Exam: Vitals:   02/06/16 2114 02/07/16 0920  BP: 108/61 (!) 98/43  Pulse: 78   Resp: 18   Temp: 98.8 F (37.1 C)     General: Somnolent, arousable Cardiovascular: S1S2/tachycardic Respiratory: CTAB  Discharge Instructions   Discharge Instructions    Diet - low sodium heart healthy    Complete by:  As directed    Increase activity slowly    Complete by:  As directed      Current Discharge Medication List    START taking these medications   Details  lactulose (CHRONULAC) 10 GM/15ML solution Take 30 mLs (20 g total) by mouth 2 (two) times daily. Refills: 0      CONTINUE these medications which have NOT CHANGED   Details  furosemide (LASIX) 40 MG tablet Take 1 tablet (40 mg total) by mouth 2 (two) times daily. Qty: 30 tablet, Refills: 0    glimepiride (AMARYL) 4 MG tablet Take 8 mg by mouth daily with breakfast.     pantoprazole (PROTONIX) 40 MG tablet Take 1 tablet (40 mg total) by mouth daily. Qty: 30 tablet, Refills: 0    propranolol (INDERAL) 10 MG tablet Take 1 tablet (10 mg total) by mouth 3 (three) times daily. Qty: 90 tablet, Refills: 0    spironolactone (ALDACTONE) 100 MG tablet Take 1 tablet (100 mg total) by mouth 2 (two) times daily. Qty: 30 tablet, Refills: 0    bisacodyl (DULCOLAX) 5 MG EC tablet Take 1 tablet (5 mg total) by mouth daily as needed for moderate constipation. Qty: 30 tablet, Refills: 0      STOP taking these medications     ferrous sulfate 325 (65 FE) MG tablet  No Known Allergies Contact information for after-discharge care    Destination    [facility] .   Specialty:  Skilled Nursing Facility Contact information: [address] [place] [address] [phone]               The results of significant diagnostics from this hospitalization (including imaging, microbiology, ancillary and laboratory) are listed below for  reference.    Significant Diagnostic Studies: Dg Chest 2 View  Result Date: 01/29/2016 CLINICAL DATA:  Shortness of breath, abdominal distension, onset of mobile 80s yesterday. Several day history of productive cough. History of CHF and diabetes. EXAM: CHEST  2 VIEW COMPARISON:  Portable chest x-ray of January 22, 2016 FINDINGS: The lungs remain hypoinflated. Patchy bibasilar densities are present and stable. The cardiac silhouette is enlarged. The pulmonary vascularity is not engorged. There is calcification in the wall of the aortic arch. Bilateral hypo inflation. Bibasilar atelectasis or scarring. There is no pneumothorax. The bony thorax exhibits no acute abnormality. There is mild multilevel degenerative disc disease of the lower thoracic spine. IMPRESSION: Hypoinflation with chronic bibasilar atelectasis or scarring. No alveolar pneumonia nor pulmonary edema. Stable cardiomegaly. Aortic atherosclerosis. Electronically Signed   By: David  [place] M.D.   On: 01/29/2016 11:32   Dg Chest 2 View  Result Date: 01/22/2016 CLINICAL DATA:  Shortness of breath for 3 days with abdominal swelling. EXAM: CHEST  2 VIEW COMPARISON:  PA and lateral chest 01/07/2016 and 12/25/2015. FINDINGS: Lung volumes are low with subsegmental atelectasis in the bases, greater on the right. No pneumothorax or pleural effusion. Heart size is normal. Aortic atherosclerosis is noted. IMPRESSION: Subsegmental bibasilar atelectasis in a low volume chest. Atherosclerosis. Electronically Signed   By: Drusilla Kanner M.D.   On: 01/22/2016 11:31   Dg Abd 1 View  Result Date: 01/30/2016 CLINICAL DATA:  Possible abdominal ascites, history of recent paracentesis EXAM: ABDOMEN - 1 VIEW COMPARISON:  None. FINDINGS: Scattered large and small bowel gas is noted. No abnormal mass or abnormal calcifications are noted. No findings to suggest recurrent ascites are seen. No free air is noted. Degenerative change of the lumbar spine is seen.  IMPRESSION: No acute abnormality noted. Electronically Signed   By: Alcide Clever M.D.   On: 01/30/2016 18:12   US Paracentesis  Result Date: 02/05/2016 INDICATION: Patient with NASH and recurrent abdominal ascites. Request is made for diagnostic and therapeutic paracentesis. EXAM: ULTRASOUND GUIDED DIAGNOSTIC AND THERAPEUTIC PARACENTESIS MEDICATIONS: 1% lidocaine. COMPLICATIONS: None immediate. PROCEDURE: Informed written consent was obtained from the patient after a discussion of the risks, benefits and alternatives to treatment. A timeout was performed prior to the initiation of the procedure. Initial ultrasound scanning demonstrates a large amount of ascites within the right lower abdominal quadrant. The right lower abdomen was prepped and draped in the usual sterile fashion. 1% lidocaine was used for local anesthesia. Following this, a 19 gauge, 7-cm, Yueh catheter was introduced. An ultrasound image was saved for documentation purposes. The paracentesis was performed. The catheter was removed and a dressing was applied. The patient tolerated the procedure well without immediate post procedural complication. FINDINGS: A total of approximately 7 L of chylous colored fluid was removed. Samples were sent to the laboratory as requested by the clinical team. IMPRESSION: Successful ultrasound-guided paracentesis yielding 7 liters of peritoneal fluid. Read by: Barnetta Chapel, PA-C Electronically Signed   By: Gilmer Mor D.O.   On: 02/05/2016 15:53   US Paracentesis  Result  Date: 01/30/2016 CLINICAL DATA:  Cirrhosis.  Recurrent abdominal ascites. EXAM: ULTRASOUND GUIDED PARACENTESIS TECHNIQUE: The procedure, risks (including but not limited to bleeding, infection, organ damage ), benefits, and alternatives were explained to the patient. Questions regarding the procedure were encouraged and answered. The patient understands and consents to the procedure. Survey ultrasound of the abdomen was performed and an  appropriate skin entry site in the right lower abdomen was selected. Skin site was marked, prepped with chlorhexidine, and draped in usual sterile fashion, and infiltrated locally with 1% lidocaine. A Safe-T-Centesis sheath needle was advanced into the peritoneal space until fluid could be aspirated. The sheath was advanced and the needle removed. 5 L of cloudy yellowascites were aspirated. COMPLICATIONS: COMPLICATIONS none IMPRESSION: Technically successful ultrasound guided paracentesis, removing 5 L of chylous ascites. Electronically Signed   By: Corlis Leak M.D.   On: 01/30/2016 09:26   US Paracentesis  Result Date: 01/22/2016 INDICATION: Recurrent ascites EXAM: ULTRASOUND-GUIDED PARACENTESIS COMPARISON:  Previous paracentesis. MEDICATIONS: 10 cc 1% lidocaine COMPLICATIONS: None immediate. TECHNIQUE: Informed written consent was obtained from the patient after a discussion of the risks, benefits and alternatives to treatment. A timeout was performed prior to the initiation of the procedure. Initial ultrasound scanning demonstrates a large amount of ascites within the right lower abdominal quadrant. The right lower abdomen was prepped and draped in the usual sterile fashion. 1% lidocaine with epinephrine was used for local anesthesia. Under direct ultrasound guidance, a 19 gauge, 7-cm, Yueh catheter was introduced. An ultrasound image was saved for documentation purposed. The paracentesis was performed. The catheter was removed and a dressing was applied. The patient tolerated the procedure well without immediate post procedural complication. FINDINGS: A total of approximately 4 liters of milky fluid was removed. Samples were sent to the laboratory as requested by the clinical team. IMPRESSION: Successful ultrasound-guided paracentesis yielding 4 liters of peritoneal fluid. Maximum per MD. Read by: Robet Leu [facility] Electronically Signed   By: Gilmer Mor D.O.   On: 01/22/2016 16:40     Microbiology: Recent Results (from the past 240 hour(s))  Culture, body fluid-bottle     Status: None (Preliminary result)   Collection Time: 02/05/16  3:12 PM  Result Value Ref Range Status   Specimen Description FLUID PERITONEAL  Final   Special Requests NONE  Final   Culture NO GROWTH 2 DAYS  Final   Report Status PENDING  Incomplete  Gram stain     Status: None   Collection Time: 02/05/16  3:12 PM  Result Value Ref Range Status   Specimen Description FLUID PERITONEAL  Final   Special Requests NONE  Final   Gram Stain   Final    FEW WBC PRESENT,BOTH PMN AND MONONUCLEAR NO ORGANISMS SEEN    Report Status 02/05/2016 FINAL  Final     Labs: Basic Metabolic Panel:  Recent Labs Lab 02/01/16 0536  02/03/16 0642 02/04/16 1128 02/04/16 1928 02/05/16 0417 02/06/16 0528 02/07/16 0443  NA 133*  < > 130* 126* 125* 129* 128* 129*  K 4.7  < > 5.1 5.6* 5.1 4.9 4.6 4.6  CL 100*  < > 97* 94* 95* 96* 97* 99*  CO2 24  < > 25 22 22 24 23 24   GLUCOSE 139*  < > 124* 207* 226* 151* 215* 126*  BUN 34*  < > 36* 38* 40* 38* 36* 31*  CREATININE 1.54*  < > 1.57* 1.55* 1.58* 1.34* 1.29* 1.23  CALCIUM 8.8*  < > 8.7* 8.9  8.6* 8.8* 9.0 8.7*  MG 2.1  --  2.0  --   --  2.1  --   --   PHOS  --   --   --   --   --  3.3 3.0 2.7  < > = values in this interval not displayed. Liver Function Tests:  Recent Labs Lab 02/01/16 0536 02/03/16 0642 02/05/16 0417 02/06/16 0528 02/07/16 0443  AST 36 43* 28  --   --   ALT 17 15* 18  --   --   ALKPHOS 87 88 89  --   --   BILITOT 1.3* 1.9* 1.4*  --   --   PROT 6.6 6.5 6.6  --   --   ALBUMIN 2.0* 1.9* 2.0* 3.1* 2.9*   No results for input(s): LIPASE, AMYLASE in the last 168 hours.  Recent Labs Lab 02/02/16 0842  AMMONIA 49*   CBC:  Recent Labs Lab 02/01/16 0536 02/03/16 0642 02/05/16 0417 02/06/16 0629  WBC 12.6* 11.4* 9.3 7.2  NEUTROABS 9.6* 8.7* 7.1 5.5  HGB 12.7* 12.7* 12.9* 11.0*  HCT 39.7 39.6 39.2 33.9*  MCV 81.5 81.8 82.4  82.1  PLT 131* 102* 99* 70*   Cardiac Enzymes: No results for input(s): CKTOTAL, CKMB, CKMBINDEX, TROPONINI in the last 168 hours. BNP: BNP (last 3 results)  Recent Labs  01/22/16 1102 01/29/16 1040 01/30/16 1525  BNP 52.0 28.3 29.4    ProBNP (last 3 results) No results for input(s): PROBNP in the last 8760 hours.  CBG:  Recent Labs Lab 02/06/16 1723 02/06/16 2111 02/07/16 0604 02/07/16 0830 02/07/16 1217  GLUCAP 212* 276* 141* 198* 197*       SignedZannie Cove MD.  [facility] 02/07/2016, 2:46 PM

## 2016-02-07 NOTE — Progress Notes (Signed)
Report called to [facility] [employer], Olu RN received report.

## 2016-02-07 NOTE — Clinical Social Work Placement (Signed)
   CLINICAL SOCIAL WORK PLACEMENT  NOTE  Date:  02/07/2016  Patient Details  Name: Justin Scott MRN: [id number] Date of Birth: 1950-12-23  Clinical Social Work is seeking post-discharge placement for this patient at the Skilled  Nursing Facility level of care (*CSW will initial, date and re-position this form in  chart as items are completed):      Patient/family provided with [facility] Clinical Social Work Department's list of facilities offering this level of care within the geographic area requested by the patient (or if unable, by the patient's family).      Patient/family informed of their freedom to choose among providers that offer the needed level of care, that participate in Medicare, Medicaid or managed care program needed by the patient, have an available bed and are willing to accept the patient.      Patient/family informed of [facility]'s ownership interest in [facility] and [facility], as well as of the fact that they are under no obligation to receive care at these facilities.  PASRR submitted to EDS on 02/01/16     PASRR number received on 02/01/16     Existing PASRR number confirmed on       FL2 transmitted to all facilities in geographic area requested by pt/family on 02/01/16     FL2 transmitted to all facilities within larger geographic area on       Patient informed that his/her managed care company has contracts with or will negotiate with certain facilities, including the following:        Yes   Patient/family informed of bed offers received.  [facility]     Physician recommends and patient chooses bed at      [facility] on 02/07/16.  Patient to be transferred to facility by [facility]tar     Patient family notified on 02/07/16 of transfer.  Name of family member notified:  Steward DroneBrenda     PHYSICIAN Please sign FL2      Additional Comment:    _______________________________________________ Burna SisUris, Carinne Brandenburger H, LCSW 02/07/2016, 3:09 PM

## 2016-02-07 NOTE — Progress Notes (Signed)
[facility] Anticipated discharge date: 10/19 Family notified: pt sister Transportation by [employer]- called at 3pm  CSW signing off.  Burna SisJenna H. Broderic Bara, LCSW Clinical Social Worker [phone]

## 2016-02-10 LAB — CULTURE, BODY FLUID W GRAM STAIN -BOTTLE: Culture: NO GROWTH

## 2016-02-10 LAB — CULTURE, BODY FLUID-BOTTLE

## 2016-02-11 ENCOUNTER — Emergency Department (HOSPITAL_COMMUNITY)
Admission: EM | Admit: 2016-02-11 | Discharge: 2016-02-11 | Disposition: A | Discharge status: Home / Self Care | Payer: BC Managed Care – PPO | Attending: Emergency Medicine | Admitting: Emergency Medicine

## 2016-02-11 ENCOUNTER — Encounter (HOSPITAL_COMMUNITY): Payer: Self-pay | Admitting: Emergency Medicine

## 2016-02-11 ENCOUNTER — Encounter: Payer: Self-pay | Admitting: Adult Health

## 2016-02-11 ENCOUNTER — Non-Acute Institutional Stay: Payer: BC Managed Care – PPO | Admitting: Adult Health

## 2016-02-11 DIAGNOSIS — E119 Type 2 diabetes mellitus without complications: Secondary | ICD-10-CM | POA: Diagnosis not present

## 2016-02-11 DIAGNOSIS — I110 Hypertensive heart disease with heart failure: Secondary | ICD-10-CM | POA: Insufficient documentation

## 2016-02-11 DIAGNOSIS — D696 Thrombocytopenia, unspecified: Secondary | ICD-10-CM | POA: Diagnosis not present

## 2016-02-11 DIAGNOSIS — I8501 Esophageal varices with bleeding: Secondary | ICD-10-CM

## 2016-02-11 DIAGNOSIS — D638 Anemia in other chronic diseases classified elsewhere: Secondary | ICD-10-CM | POA: Diagnosis not present

## 2016-02-11 DIAGNOSIS — K7290 Hepatic failure, unspecified without coma: Secondary | ICD-10-CM

## 2016-02-11 DIAGNOSIS — R188 Other ascites: Secondary | ICD-10-CM

## 2016-02-11 DIAGNOSIS — I509 Heart failure, unspecified: Secondary | ICD-10-CM | POA: Diagnosis not present

## 2016-02-11 DIAGNOSIS — K766 Portal hypertension: Secondary | ICD-10-CM | POA: Diagnosis not present

## 2016-02-11 DIAGNOSIS — K7469 Other cirrhosis of liver: Secondary | ICD-10-CM

## 2016-02-11 DIAGNOSIS — K7682 Hepatic encephalopathy: Secondary | ICD-10-CM

## 2016-02-11 DIAGNOSIS — R4182 Altered mental status, unspecified: Secondary | ICD-10-CM

## 2016-02-11 DIAGNOSIS — Z7984 Long term (current) use of oral hypoglycemic drugs: Secondary | ICD-10-CM | POA: Diagnosis not present

## 2016-02-11 NOTE — ED Notes (Signed)
Providers in room doing procedure

## 2016-02-11 NOTE — Discharge Instructions (Signed)
Patient was seen today for ascites. We were able to drain fluid off of his abdomen, his primary care provider should follow up with laboratory analysis.

## 2016-02-11 NOTE — ED Triage Notes (Addendum)
Per EMS, Pt from [facility], c/o consitpation, ascites (drained 7L last Thursday), and pt sts he is altered which is his norm when he becomes distended. Pt not really communicating at all per EMS. [name] ammonia level 148 per facility.

## 2016-02-11 NOTE — ED Provider Notes (Signed)
[facility]-EMERGENCY DEPT Provider Note   CSN: [id number] Arrival date & time: 02/11/16  1845     History   Chief Complaint Chief Complaint  Patient presents with  . Ascites  . Altered Mental Status    HPI Justin Scott is a 65 y.o. male.  5 caveat due to altered mental status.  HPI   65 year old male comes from skilled nursing facility where he is receiving hospice end-of-life care. They spoke with daughter on the phone who reports that she spoke with nursing staff, she was unaware that he was actually being transported to the hospital. She reports a history of ascites, and was hoping he can get a paracentesis was no further workup. She wishes that he be discharged back home with no hospital admission.      Past Medical History:  Diagnosis Date  . AKI (acute kidney injury) (HCC) 01/2016  . Anemia 02/2013   transfused PRBC x 3.  bled in setting of cirrhosis, esophageal varices, portal htn.   . Ascites   . Congestive heart failure (CHF) (HCC)   . GERD (gastroesophageal reflux disease)   . Hepatic cirrhosis (HCC)    [name] 12/25/2015  . Portal hypertension (HCC)    [name] 12/25/2015  . Thrombocytopenia (HCC) 01/2016  . Type II diabetes mellitus (HCC)   . Upper GI bleeding 02/2013   November 2014 with upper GI bleeding requiring 3 units of blood/notes 12/25/2015    Patient Active Problem List   Diagnosis Date Noted  . Hepatic encephalopathy (HCC) 02/01/2016  . Palliative care encounter   . Encephalopathy, hepatic (HCC)   . Acute encephalopathy 01/30/2016  . DM type 2, goal HbA1c < 7% (HCC)   . Nonadherence to medical treatment 01/22/2016  . Acute kidney injury (HCC) 01/22/2016  . Hyponatremia 01/22/2016  . Left hip pain 01/22/2016  . Cirrhosis of liver with ascites (HCC)   . Gastric erosions   . Cirrhosis (HCC)   . Fluid overload 12/25/2015  . Prolonged Q-T interval on ECG 12/25/2015  . Absolute anemia   . Ascites   . Cryptogenic cirrhosis (HCC)   . Portal  hypertension (HCC)   . Abnormal CT of liver   . Anemia of chronic disease   . Hepatic cirrhosis (HCC) 03/10/2013  . Acute upper GI bleed 03/10/2013  . Diabetes mellitus, type 2 (HCC) 03/10/2013  . Esophageal varices with bleeding (HCC) 03/10/2013  . Acute blood loss anemia 03/10/2013  . Transaminitis 03/10/2013  . Thrombocytopenia (HCC) 03/10/2013    Past Surgical History:  Procedure Laterality Date  . CARDIAC CATHETERIZATION    . ESOPHAGOGASTRODUODENOSCOPY N/A 03/10/2013   3 trunks of small to medium sized distal esophagus varices. None had clear signs of recent bleeding however there was hematin in stomach was well as a small amount of fresh red blood at GE junction. There was moderate portal gastropathy throughout the stomach. Dr Christella Hartigan placed 7 variceal ligating bands (one misfired) without immediate complication.  . ESOPHAGOGASTRODUODENOSCOPY N/A 12/27/2015   Procedure: ESOPHAGOGASTRODUODENOSCOPY (EGD);  Surgeon: Hilarie Fredrickson, MD;  Location: [facility] ENDOSCOPY;  Service: Endoscopy;  Laterality: N/A;       Home Medications    Prior to Admission medications   Medication Sig Start Date End Date Taking? Authorizing Provider  bisacodyl (DULCOLAX) 5 MG EC tablet Take 1 tablet (5 mg total) by mouth daily as needed for moderate constipation. 12/28/15   Leroy Sea, MD  furosemide (LASIX) 40 MG tablet Take 1 tablet (40 mg total) by  mouth 2 (two) times daily. 01/23/16   Calvert CantorSaima Rizwan, MD  glimepiride (AMARYL) 4 MG tablet Take 8 mg by mouth daily with breakfast.     Historical Provider, MD  ibuprofen (ADVIL,MOTRIN) 200 MG tablet Take 200 mg by mouth every 6 (six) hours as needed.    Historical Provider, MD  lactulose (CHRONULAC) 10 GM/15ML solution Take 30 mLs (20 g total) by mouth 2 (two) times daily. 02/07/16   Zannie CovePreetha Joseph, MD  omeprazole (PRILOSEC) 20 MG capsule Take 20 mg by mouth daily.    Historical Provider, MD  pantoprazole (PROTONIX) 40 MG tablet Take 1 tablet (40 mg total) by mouth  daily. 12/28/15   Leroy SeaPrashant K Singh, MD  propranolol (INDERAL) 10 MG tablet Take 1 tablet (10 mg total) by mouth 3 (three) times daily. 12/28/15   Leroy SeaPrashant K Singh, MD  spironolactone (ALDACTONE) 100 MG tablet Take 1 tablet (100 mg total) by mouth 2 (two) times daily. 01/23/16   Calvert CantorSaima Rizwan, MD    Family History Family History  Problem Relation Age of Onset  . Diabetes Mother   . Heart disease Father   . Diabetes Father     Social History Social History  Substance Use Topics  . Smoking status: Never Smoker  . Smokeless tobacco: Former NeurosurgeonUser     Comment: 01/30/2016 "quit chewing in the late 1990s"  . Alcohol use No     Allergies   Review of patient's allergies indicates no known allergies.   Review of Systems Review of Systems  All other systems reviewed and are negative.    Physical Exam Updated Vital Signs BP 109/72 (BP Location: Left Arm)   Pulse 64   Temp 97.5 F (36.4 C)   Resp 16   SpO2 96%   Physical Exam  Constitutional: He is oriented to person, place, and time. He appears well-developed and well-nourished.  HENT:  Head: Normocephalic and atraumatic.  Eyes: Conjunctivae are normal. Pupils are equal, round, and reactive to light. Right eye exhibits no discharge. Left eye exhibits no discharge. No scleral icterus.  Neck: Normal range of motion. No JVD present. No tracheal deviation present.  Pulmonary/Chest: Effort normal. No stridor.  Abdominal: Soft. He exhibits distension.  Mild tenderness to palpation of the abdomen  Neurological: He is alert and oriented to person, place, and time. Coordination normal.  Psychiatric: He has a normal mood and affect. His behavior is normal. Judgment and thought content normal.  Nursing note and vitals reviewed.    ED Treatments / Results  Labs (all labs ordered are listed, but only abnormal results are displayed) Labs Reviewed  BODY FLUID CULTURE  LACTATE DEHYDROGENASE, BODY FLUID  GLUCOSE, PERITONEAL FLUID  PROTEIN,  BODY FLUID  ALBUMIN, FLUID    EKG  EKG Interpretation None       Radiology No results found.  Procedures Procedures (including critical care time)   Bedside ultrasound. Abdominal ultrasound showed peritoneal fluid and small bowel.  Paracentesis: Patient altered unable to receive informed consent from patient. Spoke with his sister who requested paracentesis. Patient was draped and prepped in sterile fashion, anesthetic was used. Approximately 3.8 L of cloudy peritoneal fluid was removed. Specimen sent for analysis. Patient tolerated procedure well with no complications.  Medications Ordered in ED Medications - No data to display   Initial Impression / Assessment and Plan / ED Course  I have reviewed the triage vital signs and the nursing notes.  Pertinent labs & imaging results that were available during my care  of the patient were reviewed by me and considered in my medical decision making (see chart for details).  Clinical Course     Final Clinical Impressions(s) / ED Diagnoses   Final diagnoses:  Other ascites  Altered mental status, unspecified altered mental status type    Patient presents with ascites. I spoke with his sister on the phone who reports he's receiving end-of-life care. She requests that no hospital admission or significant workup be performed. She requests that we perform paracentesis for patient comfort and sent him back to skilled nursing facility. Patient is afebrile nontoxic, paracentesis was performed. Patient will be discharged back to skilled nursing facility at the request of his sister. Peritoneal fluid will be sent for culture. I informed the sister that she would need to follow-up with his primary care provider for results and further management. She was very happy with today's plan and will be following up tomorrow morning.  New Prescriptions New Prescriptions   No medications on file     Eyvonne Mechanic, PA-C 02/11/16 2146    Marily Memos, MD 02/11/16 2329

## 2016-02-11 NOTE — ED Provider Notes (Signed)
  Physical Exam  BP 109/72 (BP Location: Left Arm)   Pulse 64   Temp 97.5 F (36.4 C)   Resp 16   SpO2 96%   Physical Exam  Constitutional: He appears well-developed and well-nourished.  HENT:  Head: Normocephalic and atraumatic.  Cardiovascular: Normal rate.   Pulmonary/Chest: Effort normal. No respiratory distress.  Abdominal: He exhibits distension. He exhibits no mass. There is tenderness. There is no guarding.  Musculoskeletal: Normal range of motion.  Neurological: He is alert.  Nursing note and vitals reviewed.   ED Course  .Paracentesis Date/Time: 02/11/2016 11:31 PM Performed by: Marily MemosMESNER, Nekesha Font Authorized by: Marily MemosMESNER, Journee Bobrowski   Consent:    Consent obtained:  Verbal   Consent given by:  Healthcare agent   Risks discussed:  Bleeding, bowel perforation, infection and pain   Alternatives discussed:  No treatment and delayed treatment Pre-procedure details:    Procedure purpose:  Therapeutic   Preparation: Patient was prepped and draped in usual sterile fashion   Anesthesia (see MAR for exact dosages):    Anesthesia method:  Local infiltration   Local anesthetic:  Lidocaine 1% w/o epi Procedure details:    Needle gauge:  20   Ultrasound guidance: yes     Puncture site:  R lower quadrant   Fluid removed amount:  3.8   Fluid appearance:  Cloudy, yellow and chylous   Dressing:  Adhesive bandage Post-procedure details:    Patient tolerance of procedure:  Tolerated well, no immediate complications    MDM  On hospice, here for worsening abdominal distention and altered mental status. The PA spoke with the healthcare power of attorney stated that they did not want any thing done except for a paracentesis if possible. They didn't want the patient to come to the emergency department as he is hospice.  I was present for and helped complete the paracentesis as per procedure note.         Marily MemosJason Kaniesha Barile, MD 02/11/16 [phone]

## 2016-02-11 NOTE — ED Notes (Signed)
Bed: JX91WA25 Expected date:  Expected time:  Means of arrival:  Comments: EMS 65 yo male/ascites

## 2016-02-11 NOTE — Progress Notes (Signed)
Patient ID: Justin Scott, male   DOB: Nov 24, 1950, 65 y.o.   MRN: [id number]    Location:   [name] Room Number: 138-B Place of Service:  SNF (31)   CODE STATUS: DNR  No Known Allergies  Chief Complaint  Patient presents with  . Hospitalization Follow-up    Hospital Follow up    HPI:  He has been hospitalized for his end stage liver disease.  He was hospitalized for worsening confusion and hepatic encephalopathy.  He did undergo paracentesis. He is here for end of life care with hospice. He is unable to fully participate in the hpi or ros, there are no nursing concerns at this time    Past Medical History:  Diagnosis Date  . AKI (acute kidney injury) (HCC) 01/2016  . Anemia 02/2013   transfused PRBC x 3.  bled in setting of cirrhosis, esophageal varices, portal htn.   . Ascites   . Congestive heart failure (CHF) (HCC)   . GERD (gastroesophageal reflux disease)   . Hepatic cirrhosis (HCC)    Justin Scott 12/25/2015  . Portal hypertension (HCC)    Justin Scott 12/25/2015  . Thrombocytopenia (HCC) 01/2016  . Type II diabetes mellitus (HCC)   . Upper GI bleeding 02/2013   November 2014 with upper GI bleeding requiring 3 units of blood/notes 12/25/2015    Past Surgical History:  Procedure Laterality Date  . CARDIAC CATHETERIZATION    . ESOPHAGOGASTRODUODENOSCOPY N/A 03/10/2013   3 trunks of small to medium sized distal esophagus varices. None had clear signs of recent bleeding however there was hematin in stomach was well as a small amount of fresh red blood at GE junction. There was moderate portal gastropathy throughout the stomach. Dr Christella Hartigan placed 7 variceal ligating bands (one misfired) without immediate complication.  . ESOPHAGOGASTRODUODENOSCOPY N/A 12/27/2015   Procedure: ESOPHAGOGASTRODUODENOSCOPY (EGD);  Surgeon: Hilarie Fredrickson, MD;  Location: [facility] ENDOSCOPY;  Service: Endoscopy;  Laterality: N/A;    Social History   Social History  . Marital status: Married   Spouse name: N/A  . Number of children: 0  . 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 Smoking status: Never Smoker  . Smokeless tobacco: Former Neurosurgeon     Comment: 01/30/2016 "quit chewing in the late 1990s"  . Alcohol use No  . Drug use: No  . Sexual activity: Not Currently   Other Topics Concern  . Not on file   Social History Narrative  . No narrative on file   Family History  Problem Relation Age of Onset  . Diabetes Mother   . Heart disease Father   . Diabetes Father       VITAL SIGNS BP (!) 85/48   Pulse 74   Temp 97.7 F (36.5 C) (Oral)   Resp 18   Ht 5\' 7"  (1.702 m)   Wt 187 lb (84.8 kg)   SpO2 93%   BMI 29.29 kg/m   Patient's Medications  New Prescriptions   No medications on file  Previous Medications   BISACODYL (DULCOLAX) 5 MG EC TABLET    Take 1 tablet (5 mg total) by mouth daily as needed for moderate constipation.   FUROSEMIDE (LASIX) 40 MG TABLET    Take 1 tablet (40 mg total) by mouth 2 (two) times daily.   GLIMEPIRIDE (AMARYL) 4 MG TABLET    Take 8 mg by mouth daily with breakfast.  IBUPROFEN (ADVIL,MOTRIN) 200 MG TABLET    Take 200 mg by mouth every 6 (six) hours as needed.   LACTULOSE (CHRONULAC) 10 GM/15ML SOLUTION    Take 30 mLs (20 g total) by mouth 2 (two) times daily.   OMEPRAZOLE (PRILOSEC) 20 MG CAPSULE    Take 20 mg by mouth daily.   PANTOPRAZOLE (PROTONIX) 40 MG TABLET    Take 1 tablet (40 mg total) by mouth daily.   PROPRANOLOL (INDERAL) 10 MG TABLET    Take 1 tablet (10 mg total) by mouth 3 (three) times daily.   SPIRONOLACTONE (ALDACTONE) 100 MG TABLET    Take 1 tablet (100 mg total) by mouth 2 (two) times daily.  Modified Medications   No medications on file  Discontinued Medications   No medications on file     SIGNIFICANT DIAGNOSTIC EXAMS  12-26-15: 2-d echo:   - Left ventricle: The cavity size was normal. Systolic function was normal. The  estimated ejection fraction was in the range of 60% to 65%. Wall motion was normal; there were no regional wall motion abnormalities. Left ventricular diastolic function parameters were normal. - Mitral valve: Moderately calcified annulus. There was mild regurgitation. - Left atrium: The atrium was moderately dilated. - Pulmonary arteries: PA peak pressure: 33 mm Hg (S).  -6-17: upper endoscopy:  trivial distal esophageal remnent post prior band ligation. Incidental peptic structure of distal esophagus.  Erosive gastritis stats post CLO biopsy. ESLD with portal hypertension and ascites. MELD score 12  01-29-16: chest x-ray: Hypoinflation with chronic bibasilar atelectasis or scarring. No alveolar pneumonia nor pulmonary edema. Stable cardiomegaly.   01-29-16: paracentesis: Technically successful ultrasound guided paracentesis, removing 5 L of chylous ascites  01-30-16: kub: No acute abnormality noted.  02-05-16: paracentesis: A total of approximately 7 L of chylous colored fluid was removed.   LABS REVIEWED:   12-25-15: hgb a1c 5.8  01-29-16: wbc 9.8; hgb 13.5; hct 43.3; mcv 82.3; plt 160; glucose 208; bun 44; creat 1.86; k+ 4.7; na++ 132; alt 16; total bili 1.7; total protein 8.2; albumin 2.5 01-30-16: tsh 0.302; ammonia 119; pre-albumin 5.9 02-01-16: wc 12.6; hgb 12.7; hct 39.7; mcv 81.5 plt 131; glucose 139; bun 34; creat 1.45; k+ 4.7; na++ 133; total bili 1.3; albumin 2.0; mag 2.1 02-02-16: glucose 149; bun 32; creat 1.44; k+ 4.9;  130; ammonia 49; pre-albumin 6.4 02-06-16: wbc 7.2; hgb 11.0; hct 33.9; mcv 82.1; plt 70; glucose 215; bun 36; creat 1.29; k+ 4.6; na++ 128; phos 3.0; albumin 3.1     Review of Systems  Unable to perform ROS: Medical condition     Physical Exam  Constitutional: No distress.  Frail   Eyes: Conjunctivae are normal.  Neck: Neck supple. No JVD present. No thyromegaly present.  Cardiovascular: Normal rate, regular rhythm and intact distal pulses.     Respiratory: Effort normal and breath sounds normal. No respiratory distress. He has no wheezes.  GI: Soft. Bowel sounds are normal. He exhibits no distension. There is no tenderness.  Ascites present   Musculoskeletal: He exhibits no edema.  Able to move all extremities   Lymphadenopathy:    He has no cervical adenopathy.  Neurological: He is alert.  Skin: Skin is warm and dry. He is not diaphoretic.  Skin is dusky in color      ASSESSMENT/ PLAN:   1. Cryptogenic cirrhosis: with ascites and portal hypertension: is status post paracentesis on  02-05-16 for 7 liter:  Will continue lasix 40 mg twice daily; aldactone  100 mg twice daily   2. Esophageal varices: with bleeding: will continue protonix 40 mg daily and will stop prilosec  3. Hepatic encephalopathy: will continue lactulose 30 cc twice daily ammonia 49  4. Portal hypertension: will continue inderal 10 mg three times daily aldactone 100 mg twice daily   5. Diabetes: hgb a1c 5.8; will monitor  6. Thrombocytopenia: plt 70 will monitor   7. Anemia of chronic disease: hgb /hct: 11.0/33.9 will monitor  Will setup a hospice consult   Time spent with patient  50   minutes >50% time spent counseling; reviewing medical record; tests; labs; and developing future plan of care    Synthia Innocent NP [facility]  Contact [phone] Monday through Friday 8am- 5pm  After hours call [phone]

## 2016-02-12 ENCOUNTER — Non-Acute Institutional Stay: Payer: BC Managed Care – PPO | Admitting: Internal Medicine

## 2016-02-12 ENCOUNTER — Encounter: Payer: Self-pay | Admitting: Internal Medicine

## 2016-02-12 DIAGNOSIS — K7682 Hepatic encephalopathy: Secondary | ICD-10-CM

## 2016-02-12 DIAGNOSIS — D696 Thrombocytopenia, unspecified: Secondary | ICD-10-CM | POA: Diagnosis not present

## 2016-02-12 DIAGNOSIS — I8501 Esophageal varices with bleeding: Secondary | ICD-10-CM | POA: Diagnosis not present

## 2016-02-12 DIAGNOSIS — R188 Other ascites: Secondary | ICD-10-CM

## 2016-02-12 DIAGNOSIS — K766 Portal hypertension: Secondary | ICD-10-CM | POA: Diagnosis not present

## 2016-02-12 DIAGNOSIS — E119 Type 2 diabetes mellitus without complications: Secondary | ICD-10-CM

## 2016-02-12 DIAGNOSIS — K7290 Hepatic failure, unspecified without coma: Secondary | ICD-10-CM

## 2016-02-12 DIAGNOSIS — K7469 Other cirrhosis of liver: Secondary | ICD-10-CM | POA: Diagnosis not present

## 2016-02-12 DIAGNOSIS — D638 Anemia in other chronic diseases classified elsewhere: Secondary | ICD-10-CM | POA: Diagnosis not present

## 2016-02-12 LAB — LACTATE DEHYDROGENASE, PLEURAL OR PERITONEAL FLUID: LD, Fluid: 77 U/L — ABNORMAL HIGH (ref 3–23)

## 2016-02-12 LAB — ALBUMIN, FLUID (OTHER): Albumin, Fluid: 1.2 g/dL

## 2016-02-12 LAB — PROTEIN, BODY FLUID: Total protein, fluid: <3 g/dL

## 2016-02-12 LAB — GLUCOSE, PERITONEAL FLUID: Glucose, Peritoneal Fluid: 125 mg/dL

## 2016-02-12 NOTE — Progress Notes (Signed)
Patient ID: Justin Scott, male   DOB: 05-Jan-1951, 65 y.o.   MRN: [id number]    HISTORY AND PHYSICAL   DATE: 02/12/2016  Location:    [name] Room Number: 138 B Place of Service: SNF (31)   Extended Emergency Contact Information Primary Emergency Contact: Justin Scott Address: [address]          [place], [place] [address] [place] Home Phone: [phone] Mobile Phone: [phone] Relation: Sister  Advanced Directive information Does patient have an advance directive?: Yes, Type of Advance Directive: Out of facility DNR (pink MOST or yellow form), Does patient want to make changes to advanced directive?: No - Patient declined  Chief Complaint  Patient presents with  . New Admit To SNF    HPI:  65 yo male seen today as a new admission into SNF acute hepatic encephalopathy, hepatic cirrhosis with ascites, thrombocytopenia, portal HTN, non adherence to medical treatment, AKI, hyponatremia, DM. MELD score high and GI recommended palliative care. He did receive paracentesis with 7 L drained. Pt placed on hospice/comfort care. IV albumin given following paracentesis. Na went as low as 125-->129; Cr 1.77-->1.23; Tbilirubin 1.4; albumin 2.9; WBC 12.6K-->7.2K; Plts 130K-->70K; Hgb 11; NH3 level 49 (previously 119); INR 1.81. He presents to SNF for hospice/comfort care  Today he reports no concerns. No f/c. No CP/SOB. No nursing concerns. He is a poor historian due to hepatic encephalopathy. Hx obtained from chart. Hospice is seeing pt today. 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 Takes lasix 40 mg twice daily; aldactone 100 mg twice daily   Esophageal varices with bleeding - stable on protonix 40 mg daily  Hepatic encephalopathy - improved. Takes lactulose 30 cc twice daily ammonia 49  Portal hypertension - stable on inderal 10 mg three times  daily; aldactone 100 mg twice daily   DM - controlled. A1c 5.8%. Takes amaryl. CBG 290 today. No low BS reactions.  Thrombocytopenia - worsening. Plt 70k at d/c. No active bleeding  Anemia of chronic disease - stable. hgb 11 at d/c  Past Medical History:  Diagnosis Date  . AKI (acute kidney injury) (HCC) 01/2016  . Anemia 02/2013   transfused PRBC x 3.  bled in setting of cirrhosis, esophageal varices, portal htn.   . Ascites   . Congestive heart failure (CHF) (HCC)   . GERD (gastroesophageal reflux disease)   . Hepatic cirrhosis (HCC)    Justin Scott 12/25/2015  . Portal hypertension (HCC)    Justin Scott 12/25/2015  . Thrombocytopenia (HCC) 01/2016  . Type II diabetes mellitus (HCC)   . Upper GI bleeding 02/2013   November 2014 with upper GI bleeding requiring 3 units of blood/notes 12/25/2015    Past Surgical History:  Procedure Laterality Date  . CARDIAC CATHETERIZATION    . ESOPHAGOGASTRODUODENOSCOPY N/A 03/10/2013   3 trunks of small to medium sized distal esophagus varices. None had clear signs of recent bleeding however there was hematin in stomach was well as a small amount of fresh red blood at GE junction. There was moderate portal gastropathy throughout the stomach. Dr Christella Hartigan placed 7 variceal ligating bands (one misfired) without immediate complication.  . ESOPHAGOGASTRODUODENOSCOPY N/A 12/27/2015   Procedure: ESOPHAGOGASTRODUODENOSCOPY (EGD);  Surgeon: Hilarie Fredrickson, MD;  Location: [facility] ENDOSCOPY;  Service: Endoscopy;  Laterality: N/A;    Patient Care Team: Justin Mask, MD as PCP - General (Family  Medicine)  Social History   Social History  . Marital status: Married    Spouse name: N/A  . Number of children: 0  . 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 Smoking status: Never Smoker  . Smokeless tobacco: Former Neurosurgeon     Comment: 01/30/2016 "quit chewing in the late 1990s"  . Alcohol  use No  . Drug use: No  . Sexual activity: Not Currently   Other Topics Concern  . Not on file   Social History Narrative  . No narrative on file     reports that he has never smoked. He has quit using smokeless tobacco. He reports that he does not drink alcohol or use drugs.  Family History  Problem Relation Age of Onset  . Diabetes Mother   . Heart disease Father   . Diabetes Father    Family Status  Relation Status  . Mother   . Father     Immunization History  Administered Date(s) Administered  . Influenza-Unspecified 01/09/2016  . PPD Test 02/07/2016    No Known Allergies  Medications: Patient's Medications  New Prescriptions   No medications on file  Previous Medications   BISACODYL (DULCOLAX) 5 MG EC TABLET    Take 1 tablet (5 mg total) by mouth daily as needed for moderate constipation.   FUROSEMIDE (LASIX) 40 MG TABLET    Take 1 tablet (40 mg total) by mouth 2 (two) times daily.   GLIMEPIRIDE (AMARYL) 4 MG TABLET    Take 8 mg by mouth daily with breakfast.    IBUPROFEN (ADVIL,MOTRIN) 200 MG TABLET    Take 200 mg by mouth every 6 (six) hours as needed.   LACTULOSE (CHRONULAC) 10 GM/15ML SOLUTION    Take 30 mLs (20 g total) by mouth 2 (two) times daily.   PANTOPRAZOLE (PROTONIX) 40 MG TABLET    Take 1 tablet (40 mg total) by mouth daily.   PROPRANOLOL (INDERAL) 10 MG TABLET    Take 1 tablet (10 mg total) by mouth 3 (three) times daily.   SPIRONOLACTONE (ALDACTONE) 100 MG TABLET    Take 1 tablet (100 mg total) by mouth 2 (two) times daily.  Modified Medications   No medications on file  Discontinued Medications   OMEPRAZOLE (PRILOSEC) 20 MG CAPSULE    Take 20 mg by mouth daily.    Review of Systems  Unable to perform ROS: Other (hepatic encephalopathy)    Vitals:   02/12/16 0930  BP: 115/76  Pulse: 66  Temp: 98.6 F (37 C)  TempSrc: Oral  SpO2: 93%  Weight: 187 lb (84.8 kg)  Height: 5\' 7"  (1.702 m)   Body mass index is 29.29 kg/m.  Physical  Exam  Constitutional: He appears cachectic. He has a sickly appearance.  Frail appearing, lying in bed in NAD  HENT:  Mouth/Throat: Oropharynx is clear and moist.  Eyes: Pupils are equal, round, and reactive to light. No scleral icterus.  Neck: Neck supple. No JVD present. Carotid bruit is not present. No thyromegaly present.  Cardiovascular: Normal rate, regular rhythm and intact distal pulses.  Exam reveals no gallop and no friction rub.   Murmur (1/6 SEM) heard. L>R LE +1 pitting edema. No calf TTP  Pulmonary/Chest: Effort normal and breath sounds normal. He has no wheezes. He has no rales. He exhibits no tenderness.  Abdominal: Soft. Bowel sounds are normal. He exhibits distension, fluid  wave and ascites. He exhibits no abdominal bruit, no pulsatile midline mass and no mass. There is hepatomegaly. There is no tenderness. There is no rebound and no guarding.  obese  Lymphadenopathy:    He has no cervical adenopathy.  Neurological: He is alert.  Skin: Skin is warm and dry. No rash noted.  Psychiatric: He has a normal mood and affect. His behavior is normal.     Labs reviewed: Admission on 02/11/2016, Discharged on 02/11/2016  Component Date Value Ref Range Status  . LD, Fluid 02/12/2016 77* 3 - 23 U/L Final   Comment: POST-ULTRACENTRIFUGATION (NOTE) Results should be evaluated in conjunction with serum values Performed at [facility]   . Fluid Type-FLDH 02/12/2016 PERITONEAL   Corrected   Comment: FLUID CORRECTED ON 10/23 AT 2109: PREVIOUSLY REPORTED AS PERITONEAL CAVITY   . Glucose, Peritoneal Fluid 02/12/2016 125  mg/dL Final   Comment: NO NORMAL RANGE ESTABLISHED FOR THIS TEST POST-ULTRACENTRIFUGATION Performed at [facility]   . Total protein, fluid 02/12/2016 <3.0  g/dL Final   Comment: (NOTE) No normal range established for this test Results should be evaluated in conjunction with serum values Performed at [facility]   . Fluid Type-FTP  02/12/2016 PERITONEAL   Corrected   Comment: FLUID CORRECTED ON 10/23 AT 2110: PREVIOUSLY REPORTED AS PERITONEAL CAVITY   . Albumin, Fluid 02/12/2016 1.2  g/dL Final   Comment: POST-ULTRACENTRIFUGATION (NOTE) No normal range established for this test Results should be evaluated in conjunction with serum values Performed at [facility]   . Fluid Type-FALB 02/12/2016 PERITONEAL   Corrected   Comment: FLUID CORRECTED ON 10/23 AT 2109: PREVIOUSLY REPORTED AS PERITONEAL CAVITY   . Specimen Description 02/11/2016 PERITONEAL FLUID   Final  . Special Requests 02/11/2016 NONE   Final  . Gram Stain 02/11/2016    Final                   Value:WBC PRESENT,BOTH PMN AND MONONUCLEAR NO ORGANISMS SEEN CYTOSPIN SMEAR Gram Stain Report Called to,Read Back By and Verified With: L.ADKINS,RN 2148 02/11/16 W.SHEA   . Culture 02/11/2016 PENDING   Incomplete  . Report Status 02/11/2016 PENDING   Incomplete  Nursing Home on 02/11/2016  Component Date Value Ref Range Status  . Glucose 02/07/2016 290  mg/dL Final  Admission on 01/30/2016, Discharged on 02/07/2016  No results displayed because visit has over 200 results.  CBC Latest Ref Rng & Units 02/06/2016 02/05/2016 02/03/2016  WBC 4.0 - 10.5 K/uL 7.2 9.3 11.4(H)  Hemoglobin 13.0 - 17.0 g/dL 11.0(L) 12.9(L) 12.7(L)  Hematocrit 39.0 - 52.0 % 33.9(L) 39.2 39.6  Platelets 150 - 400 K/uL 70(L) 99(L) 102(L)   CMP Latest Ref Rng & Units 02/07/2016 02/06/2016 02/05/2016  Glucose 65 - 99 mg/dL 126(H) 215(H) 151(H)  BUN 6 - 20 mg/dL 31(H) 36(H) 38(H)  Creatinine 0.61 - 1.24 mg/dL 1.23 1.29(H) 1.34(H)  Sodium 135 - 145 mmol/L 129(L) 128(L) 129(L)  Potassium 3.5 - 5.1 mmol/L 4.6 4.6 4.9  Chloride 101 - 111 mmol/L 99(L) 97(L) 96(L)  CO2 22 - 32 mmol/L '24 23 24  '$ Calcium 8.9 - 10.3 mg/dL 8.7(L) 9.0 8.8(L)  Total Protein 6.5 - 8.1 g/dL - - 6.6  Total Bilirubin 0.3 - 1.2 mg/dL - - 1.4(H)  Alkaline Phos 38 - 126 U/L - - 89  AST 15 - 41 U/L - - 28    ALT 17 - 63 U/L - - 18     Admission  on 01/29/2016, Discharged on 01/29/2016  Component Date Value Ref Range Status  . WBC 01/29/2016 9.8  4.0 - 10.5 K/uL Final  . RBC 01/29/2016 5.26  4.22 - 5.81 MIL/uL Final  . Hemoglobin 01/29/2016 13.5  13.0 - 17.0 g/dL Final  . HCT 01/29/2016 43.3  39.0 - 52.0 % Final  . MCV 01/29/2016 82.3  78.0 - 100.0 fL Final  . MCH 01/29/2016 25.7* 26.0 - 34.0 pg Final  . MCHC 01/29/2016 31.2  30.0 - 36.0 g/dL Final  . RDW 01/29/2016 NOT CALCULATED  11.5 - 15.5 % Final  . Platelets 01/29/2016 160  150 - 400 K/uL Final  . Troponin i, poc 01/29/2016 0.00  0.00 - 0.08 ng/mL Final  . Comment 3 01/29/2016          Final   Comment: Due to the release kinetics of cTnI, a negative result within the first hours of the onset of symptoms does not rule out myocardial infarction with certainty. If myocardial infarction is still suspected, repeat the test at appropriate intervals.   . B Natriuretic Peptide 01/29/2016 28.3  0.0 - 100.0 pg/mL Final  . Sodium 01/29/2016 132* 135 - 145 mmol/L Final  . Potassium 01/29/2016 4.7  3.5 - 5.1 mmol/L Final  . Chloride 01/29/2016 99* 101 - 111 mmol/L Final  . CO2 01/29/2016 22  22 - 32 mmol/L Final  . Glucose, Bld 01/29/2016 208* 65 - 99 mg/dL Final  . BUN 01/29/2016 44* 6 - 20 mg/dL Final  . Creatinine, Ser 01/29/2016 1.86* 0.61 - 1.24 mg/dL Final  . Calcium 01/29/2016 9.1  8.9 - 10.3 mg/dL Final  . Total Protein 01/29/2016 8.2* 6.5 - 8.1 g/dL Final  . Albumin 01/29/2016 2.5* 3.5 - 5.0 g/dL Final  . AST 01/29/2016 31  15 - 41 U/L Final  . ALT 01/29/2016 16* 17 - 63 U/L Final  . Alkaline Phosphatase 01/29/2016 91  38 - 126 U/L Final  . Total Bilirubin 01/29/2016 1.7* 0.3 - 1.2 mg/dL Final  . GFR calc non Af Amer 01/29/2016 37* >60 mL/min Final  . GFR calc Af Amer 01/29/2016 43* >60 mL/min Final   Comment: (NOTE) The eGFR has been calculated using the CKD EPI equation. This calculation has not been validated in all  clinical situations. eGFR's persistently <60 mL/min signify possible Chronic Kidney Disease.   . Anion gap 01/29/2016 11  5 - 15 Final  . Prothrombin Time 01/29/2016 20.1* 11.4 - 15.2 seconds Final  . INR 01/29/2016 1.69   Final  Admission on 01/22/2016, Discharged on 01/23/2016  Component Date Value Ref Range Status  . WBC 01/22/2016 7.9  4.0 - 10.5 K/uL Final  . RBC 01/22/2016 4.97  4.22 - 5.81 MIL/uL Final  . Hemoglobin 01/22/2016 12.5* 13.0 - 17.0 g/dL Final  . HCT 01/22/2016 40.3  39.0 - 52.0 % Final  . MCV 01/22/2016 81.1  78.0 - 100.0 fL Final  . MCH 01/22/2016 25.2* 26.0 - 34.0 pg Final  . MCHC 01/22/2016 31.0  30.0 - 36.0 g/dL Final  . RDW 01/22/2016 28.3* 11.5 - 15.5 % Final  . Platelets 01/22/2016 121* 150 - 400 K/uL Final   Comment: REPEATED TO VERIFY PLATELET COUNT CONFIRMED BY SMEAR   . Neutrophils Relative % 01/22/2016 74  % Final  . Lymphocytes Relative 01/22/2016 10  % Final  . Monocytes Relative 01/22/2016 15  % Final  . Eosinophils Relative 01/22/2016 1  % Final  . Basophils Relative 01/22/2016 0  % Final  .  Neutro Abs 01/22/2016 5.8  1.7 - 7.7 K/uL Final  . Lymphs Abs 01/22/2016 0.8  0.7 - 4.0 K/uL Final  . Monocytes Absolute 01/22/2016 1.2* 0.1 - 1.0 K/uL Final  . Eosinophils Absolute 01/22/2016 0.1  0.0 - 0.7 K/uL Final  . Basophils Absolute 01/22/2016 0.0  0.0 - 0.1 K/uL Final  . RBC Morphology 01/22/2016 TEARDROP CELLS   Final  . Sodium 01/22/2016 132* 135 - 145 mmol/L Final  . Potassium 01/22/2016 4.6  3.5 - 5.1 mmol/L Final  . Chloride 01/22/2016 102  101 - 111 mmol/L Final  . CO2 01/22/2016 23  22 - 32 mmol/L Final  . Glucose, Bld 01/22/2016 128* 65 - 99 mg/dL Final  . BUN 01/22/2016 29* 6 - 20 mg/dL Final  . Creatinine, Ser 01/22/2016 1.36* 0.61 - 1.24 mg/dL Final  . Calcium 01/22/2016 8.9  8.9 - 10.3 mg/dL Final  . Total Protein 01/22/2016 7.2  6.5 - 8.1 g/dL Final  . Albumin 01/22/2016 2.4* 3.5 - 5.0 g/dL Final  . AST 01/22/2016 32  15 - 41 U/L  Final  . ALT 01/22/2016 13* 17 - 63 U/L Final  . Alkaline Phosphatase 01/22/2016 73  38 - 126 U/L Final  . Total Bilirubin 01/22/2016 1.3* 0.3 - 1.2 mg/dL Final  . GFR calc non Af Amer 01/22/2016 54* >60 mL/min Final  . GFR calc Af Amer 01/22/2016 >60  >60 mL/min Final   Comment: (NOTE) The eGFR has been calculated using the CKD EPI equation. This calculation has not been validated in all clinical situations. eGFR's persistently <60 mL/min signify possible Chronic Kidney Disease.   . Anion gap 01/22/2016 7  5 - 15 Final  . Troponin i, poc 01/22/2016 0.01  0.00 - 0.08 ng/mL Final  . Comment 3 01/22/2016          Final   Comment: Due to the release kinetics of cTnI, a negative result within the first hours of the onset of symptoms does not rule out myocardial infarction with certainty. If myocardial infarction is still suspected, repeat the test at appropriate intervals.   . B Natriuretic Peptide 01/22/2016 52.0  0.0 - 100.0 pg/mL Final  . Prothrombin Time 01/22/2016 21.2* 11.4 - 15.2 seconds Final  . INR 01/22/2016 1.81   Final  . Magnesium 01/22/2016 1.9  1.7 - 2.4 mg/dL Final  . Phosphorus 01/22/2016 4.0  2.5 - 4.6 mg/dL Final  . Glucose-Capillary 01/22/2016 85  65 - 99 mg/dL Final  . Albumin, Fluid 01/22/2016 1.5  g/dL Final   Comment: (NOTE) No normal range established for this test Results should be evaluated in conjunction with serum values   . Fluid Type-FALB 01/22/2016 FLUID   Corrected   Comment: PERITONEAL CORRECTED ON 10/03 AT 1625: PREVIOUSLY REPORTED AS Peritoneal   . LD, Fluid 01/22/2016 62* 3 - 23 U/L Final   Comment: (NOTE) Results should be evaluated in conjunction with serum values   . Fluid Type-FLDH 01/22/2016 FLUID   Corrected   Comment: PERITONEAL CORRECTED ON 10/03 AT 1627: PREVIOUSLY REPORTED AS Peritoneal   . Total protein, fluid 01/22/2016 <3.0  g/dL Final   Comment: POST-ULTRACENTRIFUGATION (NOTE) No normal range established for this  test Results should be evaluated in conjunction with serum values   . Fluid Type-FTP 01/22/2016 FLUID   Corrected   Comment: PERITONEAL CORRECTED ON 10/03 AT 1628: PREVIOUSLY REPORTED AS Peritoneal   . Fluid Type-FCT 01/22/2016 FLUID   Corrected   Comment: PERITONEAL CORRECTED ON 10/03 AT 1627:  PREVIOUSLY REPORTED AS Peritoneal   . Color, Fluid 01/22/2016 WHITE  YELLOW Final  . Appearance, Fluid 01/22/2016 TURBID* CLEAR Final  . WBC, Fluid 01/22/2016 919  0 - 1,000 cu mm Final  . Neutrophil Count, Fluid 01/22/2016 47* 0 - 25 % Final  . Lymphs, Fluid 01/22/2016 48  % Final  . Monocyte-Macrophage-Serous Fluid 01/22/2016 5* 50 - 90 % Final  . Eos, Fluid 01/22/2016 0  % Final  . Other Cells, Fluid 01/22/2016 MESOTHELIAL CELLS PRESENT  % Final  . pH, Body Fluid 01/23/2016 7.8  Not Estab. Final   Comment: (NOTE) This test was developed and its performance characteristics determined by [facility]. It has not been cleared or approved by the Food and Drug Administration. Performed At: [facility] [place], [place] [id number] Lindon Romp MD [id number]   . Source of Sample 01/23/2016 FLUID   Final  . Glucose-Capillary 01/22/2016 61* 65 - 99 mg/dL Final  . Ammonia 01/23/2016 127* 9 - 35 umol/L Final  . Sodium 01/23/2016 133* 135 - 145 mmol/L Final  . Potassium 01/23/2016 4.0  3.5 - 5.1 mmol/L Final  . Chloride 01/23/2016 101  101 - 111 mmol/L Final  . CO2 01/23/2016 25  22 - 32 mmol/L Final  . Glucose, Bld 01/23/2016 91  65 - 99 mg/dL Final  . BUN 01/23/2016 30* 6 - 20 mg/dL Final  . Creatinine, Ser 01/23/2016 1.49* 0.61 - 1.24 mg/dL Final  . Calcium 01/23/2016 8.4* 8.9 - 10.3 mg/dL Final  . Total Protein 01/23/2016 6.7  6.5 - 8.1 g/dL Final  . Albumin 01/23/2016 2.1* 3.5 - 5.0 g/dL Final  . AST 01/23/2016 27  15 - 41 U/L Final  . ALT 01/23/2016 12* 17 - 63 U/L Final  . Alkaline Phosphatase 01/23/2016 75  38 - 126 U/L Final  . Total Bilirubin 01/23/2016  1.3* 0.3 - 1.2 mg/dL Final  . GFR calc non Af Amer 01/23/2016 49* >60 mL/min Final  . GFR calc Af Amer 01/23/2016 56* >60 mL/min Final   Comment: (NOTE) The eGFR has been calculated using the CKD EPI equation. This calculation has not been validated in all clinical situations. eGFR's persistently <60 mL/min signify possible Chronic Kidney Disease.   . Anion gap 01/23/2016 7  5 - 15 Final  . WBC 01/23/2016 8.2  4.0 - 10.5 K/uL Final  . RBC 01/23/2016 4.60  4.22 - 5.81 MIL/uL Final  . Hemoglobin 01/23/2016 11.7* 13.0 - 17.0 g/dL Final  . HCT 01/23/2016 37.2* 39.0 - 52.0 % Final  . MCV 01/23/2016 80.9  78.0 - 100.0 fL Final  . MCH 01/23/2016 25.4* 26.0 - 34.0 pg Final  . MCHC 01/23/2016 31.5  30.0 - 36.0 g/dL Final  . RDW 01/23/2016 28.7* 11.5 - 15.5 % Final  . Platelets 01/23/2016 119* 150 - 400 K/uL Final   Comment: REPEATED TO VERIFY PLATELET COUNT CONFIRMED BY SMEAR   . Prothrombin Time 01/23/2016 20.4* 11.4 - 15.2 seconds Final  . INR 01/23/2016 1.72   Final  . Glucose-Capillary 01/22/2016 86  65 - 99 mg/dL Final  . Specimen Description 01/27/2016 FLUID PERITONEAL ABDOMEN   Final  . Special Requests 01/27/2016 BOTTLES DRAWN AEROBIC AND ANAEROBIC 10CC   Final  . Culture 01/27/2016 No growth aerobically or anaerobically.   Final  . Report Status 01/27/2016 01/27/2016 FINAL   Final  . Specimen Description 01/22/2016 FLUID PERITONEAL ABDOMEN   Final  . Special Requests 01/22/2016 NONE   Final  .  Gram Stain 01/22/2016    Final                   Value:MODERATE WBC PRESENT,BOTH PMN AND MONONUCLEAR NO ORGANISMS SEEN   . Report Status 01/22/2016 01/22/2016 FINAL   Final  . Glucose-Capillary 01/22/2016 154* 65 - 99 mg/dL Final  . Glucose-Capillary 01/22/2016 81  65 - 99 mg/dL Final  . Glucose-Capillary 01/23/2016 55* 65 - 99 mg/dL Final  . Glucose-Capillary 01/23/2016 85  65 - 99 mg/dL Final  . Glucose-Capillary 01/23/2016 18* 65 - 99 mg/dL Final  . Comment 1 01/23/2016 Repeat Test    Final  . Glucose-Capillary 01/23/2016 144* 65 - 99 mg/dL Final  . Glucose-Capillary 01/23/2016 70  65 - 99 mg/dL Final  Appointment on 01/14/2016  Component Date Value Ref Range Status  . Sodium 01/14/2016 132* 135 - 145 mEq/L Final  . Potassium 01/14/2016 4.2  3.5 - 5.1 mEq/L Final  . Chloride 01/14/2016 101  96 - 112 mEq/L Final  . CO2 01/14/2016 23  19 - 32 mEq/L Final  . Glucose, Bld 01/14/2016 151* 70 - 99 mg/dL Final  . BUN 01/14/2016 22  6 - 23 mg/dL Final  . Creatinine, Ser 01/14/2016 1.13  0.40 - 1.50 mg/dL Final  . Calcium 01/14/2016 8.5  8.4 - 10.5 mg/dL Final  . GFR 01/14/2016 69.70  >60.00 mL/min Final  Hospital Outpatient Visit on 01/03/2016  Component Date Value Ref Range Status  . Fluid Type-FCT 01/03/2016 FLUID   Corrected   Comment: PERITONEAL CORRECTED ON 09/14 AT 1537: PREVIOUSLY REPORTED AS Peritoneal   . Color, Fluid 01/03/2016 MILKY* YELLOW Final  . Appearance, Fluid 01/03/2016 TURBID* CLEAR Final  . WBC, Fluid 01/03/2016 1625* 0 - 1,000 cu mm Final  . Neutrophil Count, Fluid 01/03/2016 10  0 - 25 % Final  . Lymphs, Fluid 01/03/2016 62  % Final  . Monocyte-Macrophage-Serous Fluid 01/03/2016 28* 50 - 90 % Final  . Eos, Fluid 01/03/2016 0  % Final  . Other Cells, Fluid 01/03/2016 0  % Final  . Specimen Description 01/08/2016 FLUID PERITONEAL   Final  . Special Requests 01/08/2016 NONE   Final  . Culture 01/08/2016 NO GROWTH 5 DAYS   Final  . Report Status 01/08/2016 01/08/2016 FINAL   Final  . Specimen Description 01/03/2016 FLUID PERITONEAL   Final  . Special Requests 01/03/2016 NONE   Final  . Gram Stain 01/03/2016    Final                   Value:FEW WBC PRESENT,BOTH PMN AND MONONUCLEAR NO ORGANISMS SEEN   . Report Status 01/03/2016 01/03/2016 FINAL   Final  . Path Review 01/04/2016 Reactive appearing mesothelial cells.   Final   Comment: Mixed inflammatory cells Reviewed by Lennox Solders. Lyndon Code, M.D. 01/04/16   Appointment on 01/02/2016  Component Date  Value Ref Range Status  . WBC 01/02/2016 7.6  4.0 - 10.5 K/uL Final  . RBC 01/02/2016 4.33  4.22 - 5.81 Mil/uL Final  . Hemoglobin 01/02/2016 10.0* 13.0 - 17.0 g/dL Final  . HCT 01/02/2016 31.1* 39.0 - 52.0 % Final  . MCV 01/02/2016 71.9* 78.0 - 100.0 fl Final  . MCHC 01/02/2016 32.0  30.0 - 36.0 g/dL Final  . RDW 01/02/2016 27.9* 11.5 - 15.5 % Final  . Platelets 01/02/2016 162.0  150.0 - 400.0 K/uL Final  . Neutrophils Relative % 01/02/2016 69.5  43.0 - 77.0 % Final  . Lymphocytes Relative 01/02/2016 14.0  12.0 - 46.0 % Final  . Monocytes Relative 01/02/2016 14.1* 3.0 - 12.0 % Final  . Eosinophils Relative 01/02/2016 1.8  0.0 - 5.0 % Final  . Basophils Relative 01/02/2016 0.6  0.0 - 3.0 % Final  . Neutro Abs 01/02/2016 5.3  1.4 - 7.7 K/uL Final  . Lymphs Abs 01/02/2016 1.1  0.7 - 4.0 K/uL Final  . Monocytes Absolute 01/02/2016 1.1* 0.1 - 1.0 K/uL Final  . Eosinophils Absolute 01/02/2016 0.1  0.0 - 0.7 K/uL Final  . Basophils Absolute 01/02/2016 0.0  0.0 - 0.1 K/uL Final  . Sodium 01/02/2016 134* 135 - 145 mEq/L Final  . Potassium 01/02/2016 4.2  3.5 - 5.1 mEq/L Final  . Chloride 01/02/2016 100  96 - 112 mEq/L Final  . CO2 01/02/2016 26  19 - 32 mEq/L Final  . Glucose, Bld 01/02/2016 174* 70 - 99 mg/dL Final  . BUN 98/59/0341 15  6 - 23 mg/dL Final  . Creatinine, Ser 01/02/2016 0.96  0.40 - 1.50 mg/dL Final  . Total Bilirubin 01/02/2016 1.1  0.2 - 1.2 mg/dL Final  . Alkaline Phosphatase 01/02/2016 92  39 - 117 U/L Final  . AST 01/02/2016 21  0 - 37 U/L Final  . ALT 01/02/2016 10  0 - 53 U/L Final  . Total Protein 01/02/2016 7.5  6.0 - 8.3 g/dL Final  . Albumin 88/85/4182 3.0* 3.5 - 5.2 g/dL Final  . Calcium 58/27/1471 8.5  8.4 - 10.5 mg/dL Final  . GFR 57/87/3947 84.14  >60.00 mL/min Final  . INR 01/02/2016 1.8* 0.8 - 1.0 ratio Final  . Prothrombin Time 01/02/2016 19.4* 9.6 - 13.1 sec Final  . AFP-Tumor Marker 01/03/2016 3.0  <6.1 ng/mL Final   Comment: Patients < 33 month old: *  Pediatric range is based on full term neonates, values for premature infants may be higher.   Male: The use of AFP as a Tumor Marker in pregnant patients is not recommended.   This test was performed using the Beck[employer] Coulter chemiluminescent method. Values obtained from different assay methods cannot be used interchangeably. AFP levels, regardless of value, should not be interpreted as absolute evidence of the presence or absence of disease.   Admission on 12/25/2015, Discharged on 12/28/2015  No results displayed because visit has over 200 results.      Dg Chest 2 View  Result Date: 01/29/2016 CLINICAL DATA:  Shortness of breath, abdominal distension, onset of mobile 80s yesterday. Several day history of productive cough. History of CHF and diabetes. EXAM: CHEST  2 VIEW COMPARISON:  Portable chest x-ray of January 22, 2016 FINDINGS: The lungs remain hypoinflated. Patchy bibasilar densities are present and stable. The cardiac silhouette is enlarged. The pulmonary vascularity is not engorged. There is calcification in the wall of the aortic arch. Bilateral hypo inflation. Bibasilar atelectasis or scarring. There is no pneumothorax. The bony thorax exhibits no acute abnormality. There is mild multilevel degenerative disc disease of the lower thoracic spine. IMPRESSION: Hypoinflation with chronic bibasilar atelectasis or scarring. No alveolar pneumonia nor pulmonary edema. Stable cardiomegaly. Aortic atherosclerosis. Electronically Signed   By: David  [place] M.D.   On: 01/29/2016 11:32   Dg Chest 2 View  Result Date: 01/22/2016 CLINICAL DATA:  Shortness of breath for 3 days with abdominal swelling. EXAM: CHEST  2 VIEW COMPARISON:  PA and lateral chest 01/07/2016 and 12/25/2015. FINDINGS: Lung volumes are low with subsegmental atelectasis in the bases, greater on the right. No pneumothorax or pleural effusion. Heart size  is normal. Aortic atherosclerosis is noted. IMPRESSION: Subsegmental  bibasilar atelectasis in a low volume chest. Atherosclerosis. Electronically Signed   By: Drusilla Kanner M.D.   On: 01/22/2016 11:31   Dg Abd 1 View  Result Date: 01/30/2016 CLINICAL DATA:  Possible abdominal ascites, history of recent paracentesis EXAM: ABDOMEN - 1 VIEW COMPARISON:  None. FINDINGS: Scattered large and small bowel gas is noted. No abnormal mass or abnormal calcifications are noted. No findings to suggest recurrent ascites are seen. No free air is noted. Degenerative change of the lumbar spine is seen. IMPRESSION: No acute abnormality noted. Electronically Signed   By: Alcide Clever M.D.   On: 01/30/2016 18:12   US Paracentesis  Result Date: 02/05/2016 INDICATION: Patient with NASH and recurrent abdominal ascites. Request is made for diagnostic and therapeutic paracentesis. EXAM: ULTRASOUND GUIDED DIAGNOSTIC AND THERAPEUTIC PARACENTESIS MEDICATIONS: 1% lidocaine. COMPLICATIONS: None immediate. PROCEDURE: Informed written consent was obtained from the patient after a discussion of the risks, benefits and alternatives to treatment. A timeout was performed prior to the initiation of the procedure. Initial ultrasound scanning demonstrates a large amount of ascites within the right lower abdominal quadrant. The right lower abdomen was prepped and draped in the usual sterile fashion. 1% lidocaine was used for local anesthesia. Following this, a 19 gauge, 7-cm, Yueh catheter was introduced. An ultrasound image was saved for documentation purposes. The paracentesis was performed. The catheter was removed and a dressing was applied. The patient tolerated the procedure well without immediate post procedural complication. FINDINGS: A total of approximately 7 L of chylous colored fluid was removed. Samples were sent to the laboratory as requested by the clinical team. IMPRESSION: Successful ultrasound-guided paracentesis yielding 7 liters of peritoneal fluid. Read by: Barnetta Chapel, PA-C  Electronically Signed   By: Gilmer Mor D.O.   On: 02/05/2016 15:53   US Paracentesis  Result Date: 01/30/2016 CLINICAL DATA:  Cirrhosis.  Recurrent abdominal ascites. EXAM: ULTRASOUND GUIDED PARACENTESIS TECHNIQUE: The procedure, risks (including but not limited to bleeding, infection, organ damage ), benefits, and alternatives were explained to the patient. Questions regarding the procedure were encouraged and answered. The patient understands and consents to the procedure. Survey ultrasound of the abdomen was performed and an appropriate skin entry site in the right lower abdomen was selected. Skin site was marked, prepped with chlorhexidine, and draped in usual sterile fashion, and infiltrated locally with 1% lidocaine. A Safe-T-Centesis sheath needle was advanced into the peritoneal space until fluid could be aspirated. The sheath was advanced and the needle removed. 5 L of cloudy yellowascites were aspirated. COMPLICATIONS: COMPLICATIONS none IMPRESSION: Technically successful ultrasound guided paracentesis, removing 5 L of chylous ascites. Electronically Signed   By: Corlis Leak M.D.   On: 01/30/2016 09:26   US Paracentesis  Result Date: 01/22/2016 INDICATION: Recurrent ascites EXAM: ULTRASOUND-GUIDED PARACENTESIS COMPARISON:  Previous paracentesis. MEDICATIONS: 10 cc 1% lidocaine COMPLICATIONS: None immediate. TECHNIQUE: Informed written consent was obtained from the patient after a discussion of the risks, benefits and alternatives to treatment. A timeout was performed prior to the initiation of the procedure. Initial ultrasound scanning demonstrates a large amount of ascites within the right lower abdominal quadrant. The right lower abdomen was prepped and draped in the usual sterile fashion. 1% lidocaine with epinephrine was used for local anesthesia. Under direct ultrasound guidance, a 19 gauge, 7-cm, Yueh catheter was introduced. An ultrasound image was saved for documentation purposed. The  paracentesis was performed. The catheter was removed and a dressing was applied.  The patient tolerated the procedure well without immediate post procedural complication. FINDINGS: A total of approximately 4 liters of milky fluid was removed. Samples were sent to the laboratory as requested by the clinical team. IMPRESSION: Successful ultrasound-guided paracentesis yielding 4 liters of peritoneal fluid. Maximum per MD. Read by: Lavonia Drafts [facility] Electronically Signed   By: Corrie Mckusick D.O.   On: 01/22/2016 16:40     Assessment/Plan   ICD-9-CM ICD-10-CM   1. Other ascites 789.59 R18.8   2. Cryptogenic cirrhosis (HCC) 571.5 K74.69   3. Portal hypertension (HCC) 572.3 K76.6   4. Thrombocytopenia (HCC) 287.5 D69.6   5. Anemia of chronic disease 285.29 D63.8   6. Hepatic encephalopathy (HCC) 572.2 K72.90   7. Idiopathic esophageal varices with bleeding (HCC) 456.0 I85.01   8. DM type 2, goal HbA1c < 7% (HCC) 250.00 E11.9     Cont current meds as ordered  May have pleasure foods as tolerated  Hospice to follow  GOAL: comfort care/hospice. Communicated with pt and nursing.  Will follow  Justin Scott  [facility] [address] [place], [place] [address] [phone] Cell (Monday-Friday 8 AM - 5 PM) [phone] After 5 PM and follow prompts

## 2016-02-14 ENCOUNTER — Non-Acute Institutional Stay: Payer: BC Managed Care – PPO | Admitting: Adult Health

## 2016-02-14 ENCOUNTER — Encounter: Payer: Self-pay | Admitting: Adult Health

## 2016-02-14 DIAGNOSIS — K766 Portal hypertension: Secondary | ICD-10-CM

## 2016-02-14 DIAGNOSIS — K7469 Other cirrhosis of liver: Secondary | ICD-10-CM

## 2016-02-14 DIAGNOSIS — I8501 Esophageal varices with bleeding: Secondary | ICD-10-CM

## 2016-02-14 NOTE — Progress Notes (Signed)
Patient ID: Justin Scott, male   DOB: April 19, 1951, 65 y.o.   MRN: [id number]   Location:   [name] Room Number: 138-B Place of Service:  SNF (31)   CODE STATUS: DNR  No Known Allergies  Chief Complaint  Patient presents with  . Acute Visit    End of life issues    HPI:  He is being followed by hospice care. The staff has asked that I review his medications to determine what medications can be stopped. More than likely he is not receiving benefit from his diabetes medication; and chronulac.   Past Medical History:  Diagnosis Date  . AKI (acute kidney injury) (HCC) 01/2016  . Anemia 02/2013   transfused PRBC x 3.  bled in setting of cirrhosis, esophageal varices, portal htn.   . Ascites   . Congestive heart failure (CHF) (HCC)   . GERD (gastroesophageal reflux disease)   . Hepatic cirrhosis (HCC)    [name] 12/25/2015  . Portal hypertension (HCC)    [name] 12/25/2015  . Thrombocytopenia (HCC) 01/2016  . Type II diabetes mellitus (HCC)   . Upper GI bleeding 02/2013   November 2014 with upper GI bleeding requiring 3 units of blood/notes 12/25/2015    Past Surgical History:  Procedure Laterality Date  . CARDIAC CATHETERIZATION    . ESOPHAGOGASTRODUODENOSCOPY N/A 03/10/2013   3 trunks of small to medium sized distal esophagus varices. None had clear signs of recent bleeding however there was hematin in stomach was well as a small amount of fresh red blood at GE junction. There was moderate portal gastropathy throughout the stomach. Dr Christella Hartigan placed 7 variceal ligating bands (one misfired) without immediate complication.  . ESOPHAGOGASTRODUODENOSCOPY N/A 12/27/2015   Procedure: ESOPHAGOGASTRODUODENOSCOPY (EGD);  Surgeon: Hilarie Fredrickson, MD;  Location: [facility] ENDOSCOPY;  Service: Endoscopy;  Laterality: N/A;    Social History   Social History  . Marital status: Married    Spouse name: N/A  . Number of children: 0  . Years of education: N/A   Occupational History    . 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 Smoking status: Never Smoker  . Smokeless tobacco: Former Neurosurgeon     Comment: 01/30/2016 "quit chewing in the late 1990s"  . Alcohol use No  . Drug use: No  . Sexual activity: Not Currently   Other Topics Concern  . Not on file   Social History Narrative  . No narrative on file   Family History  Problem Relation Age of Onset  . Diabetes Mother   . Heart disease Father   . Diabetes Father       VITAL SIGNS BP 103/69   Pulse 70   Temp 97.9 F (36.6 C) (Oral)   Resp 16   Ht 5\' 7"  (1.702 m)   Wt 187 lb (84.8 kg)   SpO2 93%   BMI 29.29 kg/m   Patient's Medications  New Prescriptions   No medications on file  Previous Medications   BISACODYL (DULCOLAX) 5 MG EC TABLET    Take 1 tablet (5 mg total) by mouth daily as needed for moderate constipation.   FUROSEMIDE (LASIX) 40 MG TABLET    Take 1 tablet (40 mg total) by mouth 2 (two) times daily.   GLIMEPIRIDE (AMARYL) 4 MG TABLET    Take 8 mg by mouth daily with breakfast.    IBUPROFEN (ADVIL,MOTRIN) 200 MG TABLET    Take 200 mg by mouth  every 6 (six) hours as needed.   LACTULOSE (CHRONULAC) 10 GM/15ML SOLUTION    Take 30 mLs (20 g total) by mouth 2 (two) times daily.   PANTOPRAZOLE (PROTONIX) 40 MG TABLET    Take 1 tablet (40 mg total) by mouth daily.   PROPRANOLOL (INDERAL) 10 MG TABLET    Take 1 tablet (10 mg total) by mouth 3 (three) times daily.   SPIRONOLACTONE (ALDACTONE) 100 MG TABLET    Take 1 tablet (100 mg total) by mouth 2 (two) times daily.  Modified Medications   No medications on file  Discontinued Medications   No medications on file     SIGNIFICANT DIAGNOSTIC EXAMS  12-26-15: 2-d echo:   - Left ventricle: The cavity size was normal. Systolic function was normal. The estimated ejection fraction was in the range of 60% to 65%. Wall motion was normal; there were no regional wall motion abnormalities. Left ventricular diastolic  function parameters were normal. - Mitral valve: Moderately calcified annulus. There was mild regurgitation. - Left atrium: The atrium was moderately dilated. - Pulmonary arteries: PA peak pressure: 33 mm Hg (S).  -6-17: upper endoscopy:  trivial distal esophageal remnent post prior band ligation. Incidental peptic structure of distal esophagus.  Erosive gastritis stats post CLO biopsy. ESLD with portal hypertension and ascites. MELD score 12  01-29-16: chest x-ray: Hypoinflation with chronic bibasilar atelectasis or scarring. No alveolar pneumonia nor pulmonary edema. Stable cardiomegaly.   01-29-16: paracentesis: Technically successful ultrasound guided paracentesis, removing 5 L of chylous ascites  01-30-16: kub: No acute abnormality noted.  02-05-16: paracentesis: A total of approximately 7 L of chylous colored fluid was removed.   LABS REVIEWED:   12-25-15: hgb a1c 5.8  01-29-16: wbc 9.8; hgb 13.5; hct 43.3; mcv 82.3; plt 160; glucose 208; bun 44; creat 1.86; k+ 4.7; na++ 132; alt 16; total bili 1.7; total protein 8.2; albumin 2.5 01-30-16: tsh 0.302; ammonia 119; pre-albumin 5.9 02-01-16: wc 12.6; hgb 12.7; hct 39.7; mcv 81.5 plt 131; glucose 139; bun 34; creat 1.45; k+ 4.7; na++ 133; total bili 1.3; albumin 2.0; mag 2.1 02-02-16: glucose 149; bun 32; creat 1.44; k+ 4.9;  130; ammonia 49; pre-albumin 6.4 02-06-16: wbc 7.2; hgb 11.0; hct 33.9; mcv 82.1; plt 70; glucose 215; bun 36; creat 1.29; k+ 4.6; na++ 128; phos 3.0; albumin 3.1     Review of Systems  Unable to perform ROS: Medical condition     Physical Exam  Constitutional: No distress.  Frail   Eyes: Conjunctivae are normal.  Neck: Neck supple. No JVD present. No thyromegaly present.  Cardiovascular: Normal rate, regular rhythm and intact distal pulses.   Respiratory: Effort normal and breath sounds normal. No respiratory distress. He has no wheezes.  GI: Soft. Bowel sounds are normal. He exhibits no distension. There  is no tenderness.  Ascites present   Musculoskeletal: He exhibits no edema.  Able to move all extremities   Lymphadenopathy:    He has no cervical adenopathy.  Neurological: He is alert.  Skin: Skin is warm and dry. He is not diaphoretic.  Skin is dusky in color      ASSESSMENT/ PLAN:   1. Cryptogenic cirrhosis: with ascites and portal hypertension: is status post paracentesis on  02-05-16 for 7 liter:   2. Esophageal varices: with bleeding:  3. Hepatic encephalopathy:  4. Diabetes: hgb a1c 5.8;  Will stop amaryl and chronulac  Will start ativan concentrate 0.5 mg every 4 hours as needed Will start roxanol 5  mg every 2 hours as needed    MD is aware of resident's narcotic use and is in agreement with current plan of care. We will attempt to wean resident as apropriate   Synthia Innocent NP [facility]  Contact [phone] Monday through Friday 8am- 5pm  After hours call [phone]

## 2016-02-15 LAB — BODY FLUID CULTURE: Culture: NO GROWTH

## 2016-03-10 ENCOUNTER — Encounter: Payer: Self-pay | Admitting: Adult Health

## 2016-03-10 ENCOUNTER — Non-Acute Institutional Stay: Payer: BC Managed Care – PPO | Admitting: Adult Health

## 2016-03-10 DIAGNOSIS — E119 Type 2 diabetes mellitus without complications: Secondary | ICD-10-CM

## 2016-03-10 DIAGNOSIS — K7469 Other cirrhosis of liver: Secondary | ICD-10-CM

## 2016-03-10 DIAGNOSIS — D638 Anemia in other chronic diseases classified elsewhere: Secondary | ICD-10-CM | POA: Diagnosis not present

## 2016-03-10 DIAGNOSIS — K7290 Hepatic failure, unspecified without coma: Secondary | ICD-10-CM | POA: Diagnosis not present

## 2016-03-10 DIAGNOSIS — D696 Thrombocytopenia, unspecified: Secondary | ICD-10-CM | POA: Diagnosis not present

## 2016-03-10 DIAGNOSIS — R188 Other ascites: Secondary | ICD-10-CM

## 2016-03-10 DIAGNOSIS — K7460 Unspecified cirrhosis of liver: Secondary | ICD-10-CM

## 2016-03-10 DIAGNOSIS — K7682 Hepatic encephalopathy: Secondary | ICD-10-CM

## 2016-03-10 NOTE — Progress Notes (Signed)
Patient ID: Justin Scott, male   DOB: 11-Nov-1950, 65 y.o.   MRN: [id number]   Location:   [name] Room Number: 138-B Place of Service:  SNF (31)    CODE STATUS: DNR  No Known Allergies  Chief Complaint  Patient presents with  . Medical Management of Chronic Issues    Follow up    HPI:  He is a long term resident of this facilty being seen for the management of his chronic illnesses. He continues to be followed by hospice care. He did tell me today that he was feeling ok. There are no nursing concerns at this time. There are no nursing concerns at this time.     Past Medical History:  Diagnosis Date  . AKI (acute kidney injury) (HCC) 01/2016  . Anemia 02/2013   transfused PRBC x 3.  bled in setting of cirrhosis, esophageal varices, portal htn.   . Ascites   . Congestive heart failure (CHF) (HCC)   . GERD (gastroesophageal reflux disease)   . Hepatic cirrhosis (HCC)    [name] 12/25/2015  . Portal hypertension (HCC)    [name] 12/25/2015  . Thrombocytopenia (HCC) 01/2016  . Type II diabetes mellitus (HCC)   . Upper GI bleeding 02/2013   November 2014 with upper GI bleeding requiring 3 units of blood/notes 12/25/2015    Past Surgical History:  Procedure Laterality Date  . CARDIAC CATHETERIZATION    . ESOPHAGOGASTRODUODENOSCOPY N/A 03/10/2013   3 trunks of small to medium sized distal esophagus varices. None had clear signs of recent bleeding however there was hematin in stomach was well as a small amount of fresh red blood at GE junction. There was moderate portal gastropathy throughout the stomach. Dr Christella Hartigan placed 7 variceal ligating bands (one misfired) without immediate complication.  . ESOPHAGOGASTRODUODENOSCOPY N/A 12/27/2015   Procedure: ESOPHAGOGASTRODUODENOSCOPY (EGD);  Surgeon: Hilarie Fredrickson, MD;  Location: [facility] ENDOSCOPY;  Service: Endoscopy;  Laterality: N/A;    Social History   Social History  . Marital status: Married    Spouse name: N/A  .  Number of children: 0  . 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 Smoking status: Never Smoker  . Smokeless tobacco: Former Neurosurgeon     Comment: 01/30/2016 "quit chewing in the late 1990s"  . Alcohol use No  . Drug use: No  . Sexual activity: Not Currently   Other Topics Concern  . Not on file   Social History Narrative  . No narrative on file   Family History  Problem Relation Age of Onset  . Diabetes Mother   . Heart disease Father   . Diabetes Father       VITAL SIGNS BP 100/60   Pulse 66   Temp 98 F (36.7 C) (Oral)   Resp 18   Ht 5\' 7"  (1.702 m)   Wt 158 lb (71.7 kg)   SpO2 93%   BMI 24.75 kg/m   Patient's Medications  New Prescriptions   No medications on file  Previous Medications   BISACODYL (DULCOLAX) 5 MG EC TABLET    Take 1 tablet (5 mg total) by mouth daily as needed for moderate constipation.   FUROSEMIDE (LASIX) 40 MG TABLET    Take 1 tablet (40 mg total) by mouth 2 (two) times daily.   IBUPROFEN (ADVIL,MOTRIN) 200 MG TABLET    Take 200 mg by mouth every  6 (six) hours as needed.   LORAZEPAM (ATIVAN) 2 MG/ML CONCENTRATED SOLUTION    Take by mouth. Give 0.25 ml by mouth every 4 hours as needed for anxiety.   MORPHINE 20 MG/5ML SOLUTION    Take 5 mg by mouth every 2 (two) hours as needed for pain.   PANTOPRAZOLE (PROTONIX) 40 MG TABLET    Take 1 tablet (40 mg total) by mouth daily.   PROPRANOLOL (INDERAL) 10 MG TABLET    Take 1 tablet (10 mg total) by mouth 3 (three) times daily.   SPIRONOLACTONE (ALDACTONE) 100 MG TABLET    Take 1 tablet (100 mg total) by mouth 2 (two) times daily.  Modified Medications   No medications on file  Discontinued Medications   GLIMEPIRIDE (AMARYL) 4 MG TABLET    Take 8 mg by mouth daily with breakfast.    LACTULOSE (CHRONULAC) 10 GM/15ML SOLUTION    Take 30 mLs (20 g total) by mouth 2 (two) times daily.     SIGNIFICANT DIAGNOSTIC  EXAMS  12-26-15: 2-d echo:   - Left ventricle: The cavity size was normal. Systolic function was normal. The estimated ejection fraction was in the range of 60% to 65%. Wall motion was normal; there were no regional wall motion abnormalities. Left ventricular diastolic function parameters were normal. - Mitral valve: Moderately calcified annulus. There was mild regurgitation. - Left atrium: The atrium was moderately dilated. - Pulmonary arteries: PA peak pressure: 33 mm Hg (S).  -6-17: upper endoscopy:  trivial distal esophageal remnent post prior band ligation. Incidental peptic structure of distal esophagus.  Erosive gastritis stats post CLO biopsy. ESLD with portal hypertension and ascites. MELD score 12  01-29-16: chest x-ray: Hypoinflation with chronic bibasilar atelectasis or scarring. No alveolar pneumonia nor pulmonary edema. Stable cardiomegaly.   01-29-16: paracentesis: Technically successful ultrasound guided paracentesis, removing 5 L of chylous ascites  01-30-16: kub: No acute abnormality noted.  02-05-16: paracentesis: A total of approximately 7 L of chylous colored fluid was removed.   LABS REVIEWED:   12-25-15: hgb a1c 5.8  01-29-16: wbc 9.8; hgb 13.5; hct 43.3; mcv 82.3; plt 160; glucose 208; bun 44; creat 1.86; k+ 4.7; na++ 132; alt 16; total bili 1.7; total protein 8.2; albumin 2.5 01-30-16: tsh 0.302; ammonia 119; pre-albumin 5.9 02-01-16: wc 12.6; hgb 12.7; hct 39.7; mcv 81.5 plt 131; glucose 139; bun 34; creat 1.45; k+ 4.7; na++ 133; total bili 1.3; albumin 2.0; mag 2.1 02-02-16: glucose 149; bun 32; creat 1.44; k+ 4.9;  130; ammonia 49; pre-albumin 6.4 02-06-16: wbc 7.2; hgb 11.0; hct 33.9; mcv 82.1; plt 70; glucose 215; bun 36; creat 1.29; k+ 4.6; na++ 128; phos 3.0; albumin 3.1  02-11-16: ammonia: 148    Review of Systems  Unable to perform ROS: Medical condition     Physical Exam  Constitutional: No distress.  Frail   Eyes: Conjunctivae are normal.  Neck:  Neck supple. No JVD present. No thyromegaly present.  Cardiovascular: Normal rate, regular rhythm and intact distal pulses.   Respiratory: Effort normal and breath sounds normal. No respiratory distress. He has no wheezes.  GI: Soft. Bowel sounds are normal. He exhibits no distension. There is no tenderness.  Ascites present   Musculoskeletal: He exhibits no edema.  Able to move all extremities   Lymphadenopathy:    He has no cervical adenopathy.  Neurological: He is alert.  Skin: Skin is warm and dry. He is not diaphoretic.  Skin is dusky in color  ASSESSMENT/ PLAN:   1. Cryptogenic cirrhosis: with ascites and portal hypertension: is status post paracentesis on  02-05-16 for 7 liter:  Will continue lasix 40 mg twice daily; aldactone 100 mg twice daily  He does have available roxanol 5 mg every 2 hours as needed for pain and has ativan 0.5 mg every 4 hours as needed for anxiety   2. Esophageal varices: with bleeding: will continue protonix 40 mg daily and will stop prilosec  3. Hepatic encephalopathy: ammonia level 148; is off medications; more than likely he was not receiving benefit from lactulose.   4. Portal hypertension: will continue inderal 10 mg three times daily aldactone 100 mg twice daily   5. Diabetes: hgb a1c 5.8; will monitor is off medications   6. Thrombocytopenia: plt 70 will monitor   7. Anemia of chronic disease: hgb /hct: 11.0/33.9 will monitor      MD is aware of resident's narcotic use and is in agreement with current plan of care. We will attempt to wean resident as apropriate   Synthia Innocenteborah Daylyn Azbill NP [facility]  Contact [phone] Monday through Friday 8am- 5pm  After hours call [phone]

## 2016-03-12 ENCOUNTER — Ambulatory Visit: Payer: BC Managed Care – PPO | Admitting: Gastroenterology

## 2016-04-17 ENCOUNTER — Other Ambulatory Visit: Payer: Self-pay

## 2016-04-17 MED ORDER — LORAZEPAM 2 MG/ML PO CONC: ORAL | 0 refills | Status: AC

## 2016-04-17 NOTE — Telephone Encounter (Signed)
Prescription request was received from:  [facility]  [address] [place] [address]  PHONE: [phone]   Fax: [phone] 

## 2016-05-22 DEATH — deceased

## 2016-09-08 IMAGING — US US PARACENTESIS
1 series · 5 of 5 positions shown · non-contrast
Comparison: None.

MEDICATIONS:
10 cc 1% lidocaine

COMPLICATIONS:
None immediate.

INDICATION: History of cirrhosis, now with symptomatic ascites. Please perform
ultrasound-guided paracentesis for therapeutic and diagnostic
purposes.

EXAM:
ULTRASOUND-GUIDED PARACENTESIS
TECHNIQUE: Informed written consent was obtained from the patient after a
discussion of the risks, benefits and alternatives to treatment. A
timeout was performed prior to the initiation of the procedure.

[Series 1: us paracentesis · 0.28mm/px · 5 of 5 slices shown]
[im 1/5]
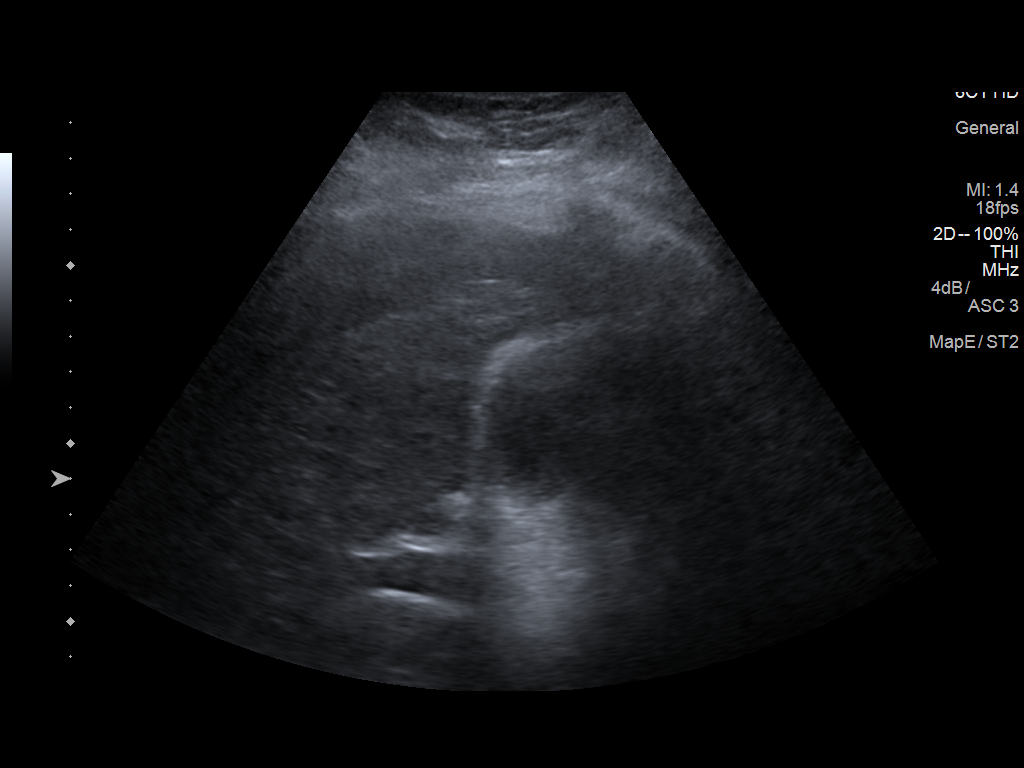
[im 2/5]
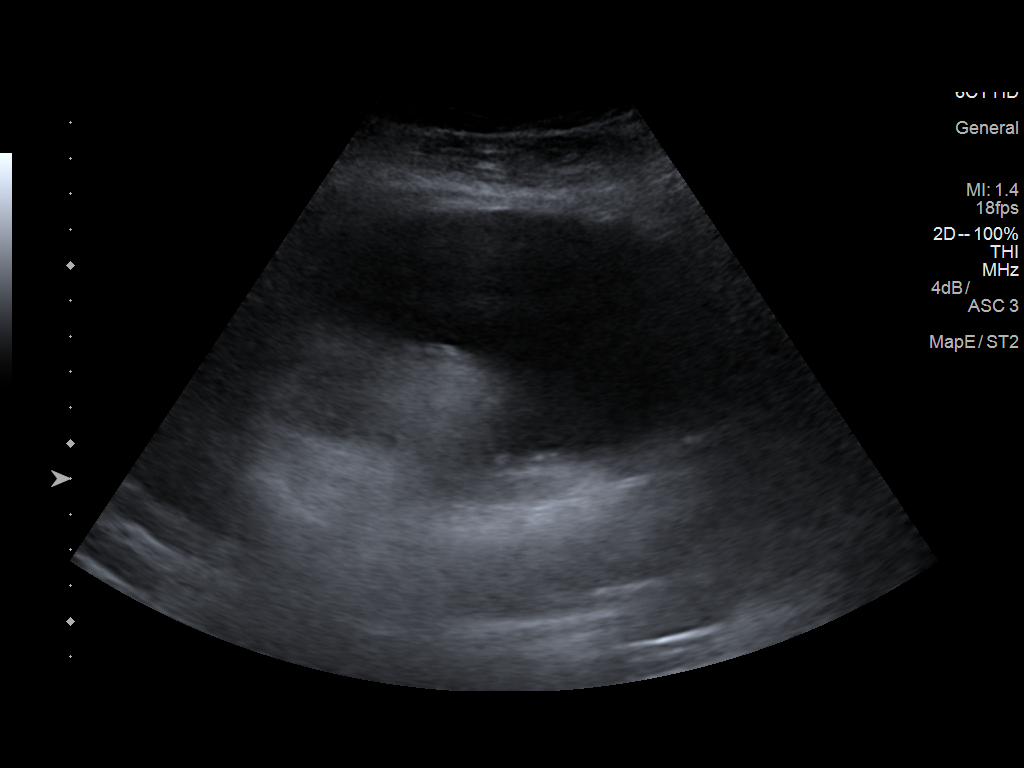
[im 3/5]
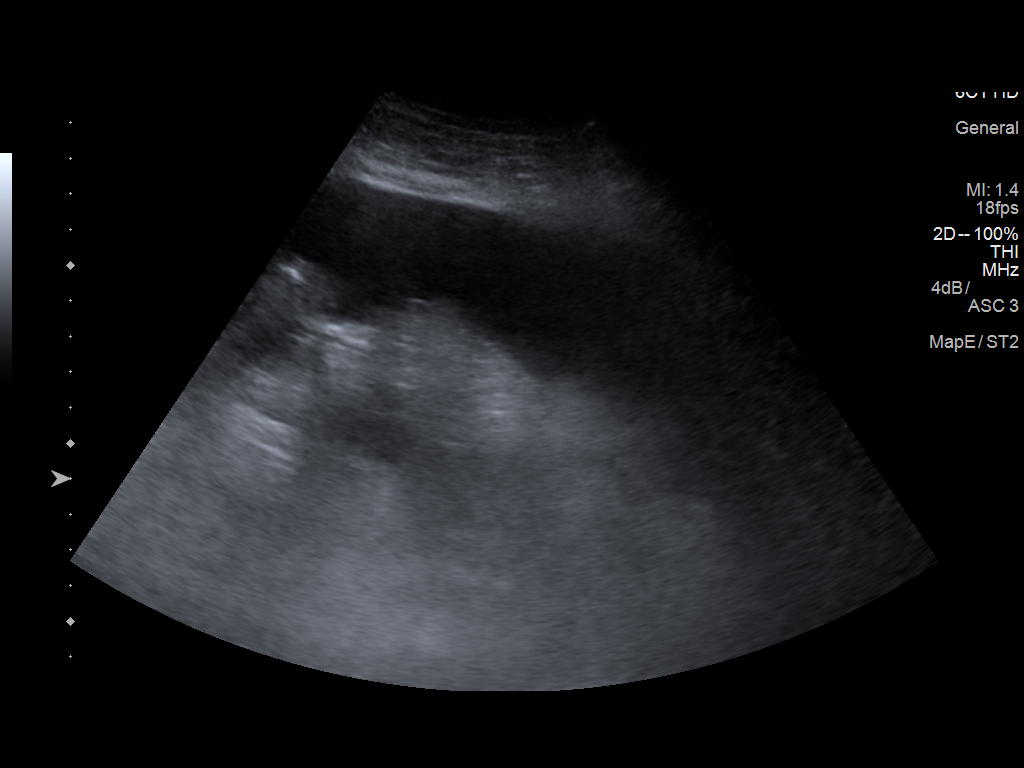
[im 4/5]
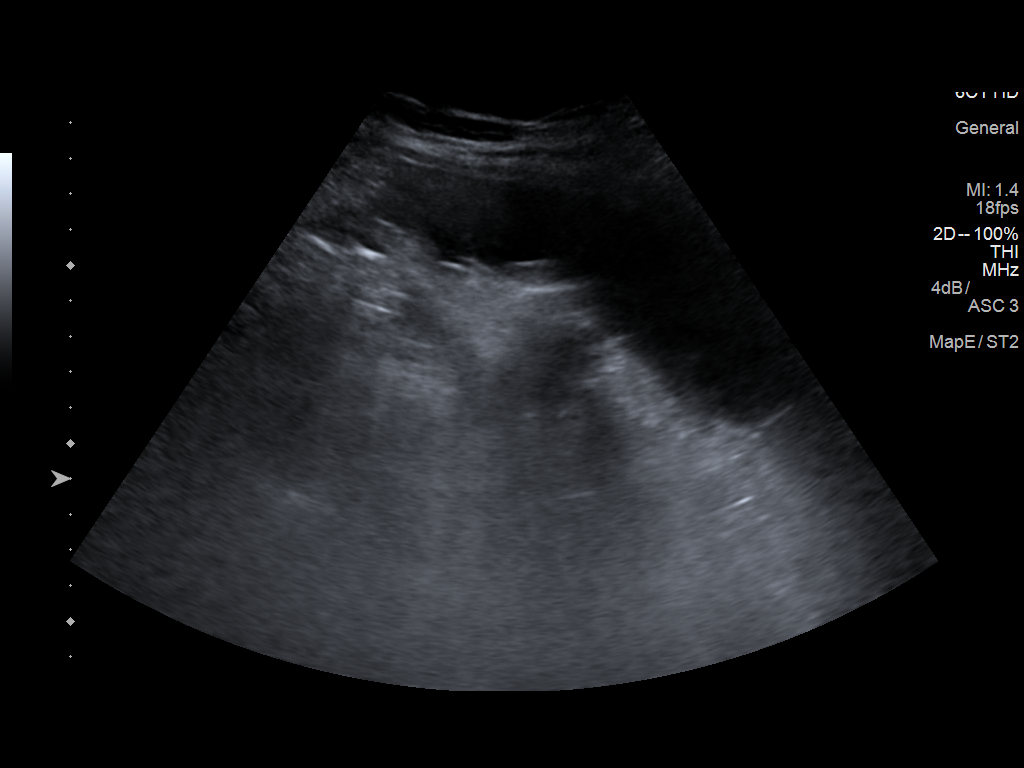
[im 5/5]
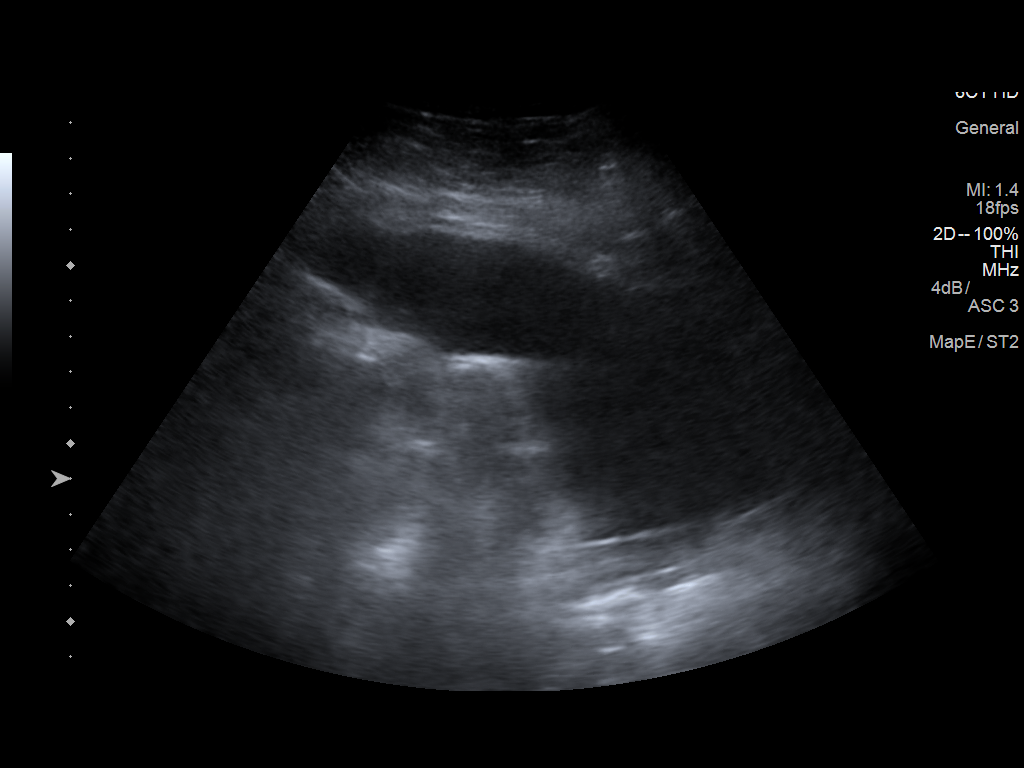

[5 of 5 positions shown; findings below may reference images not displayed]

Initial ultrasound scanning demonstrates a large amount of ascites
within the right lower abdominal quadrant. The right lower abdomen
was prepped and draped in the usual sterile fashion. 1% lidocaine
with epinephrine was used for local anesthesia. Under direct
ultrasound guidance, a 19 gauge, 7-cm, Yueh catheter was introduced.
An ultrasound image was saved for documentation purposed. The
paracentesis was performed. The catheter was removed and a dressing
was applied. The patient tolerated the procedure well without
immediate post procedural complication.
FINDINGS: A total of approximately 1.1 liters of creamy colored fluid was
removed. Samples were sent to the laboratory as requested by the
clinical team.
IMPRESSION: Successful ultrasound-guided paracentesis yielding 1.1 liters of
peritoneal fluid.

Read by:

Nk Podravac Guina

## 2018-05-26 IMAGING — DX DG CHEST 2V
2 series · 2 of 2 positions shown · non-contrast
Comparison: Portable chest x-ray March 09, 2013

CLINICAL DATA: Cryptogenic cirrhosis, ascites, now with increased
abdominal swelling, peripheral edema, wheezing, and shortness of
breath for the past 3 days.

EXAM:
CHEST  2 VIEW

[chest pa]
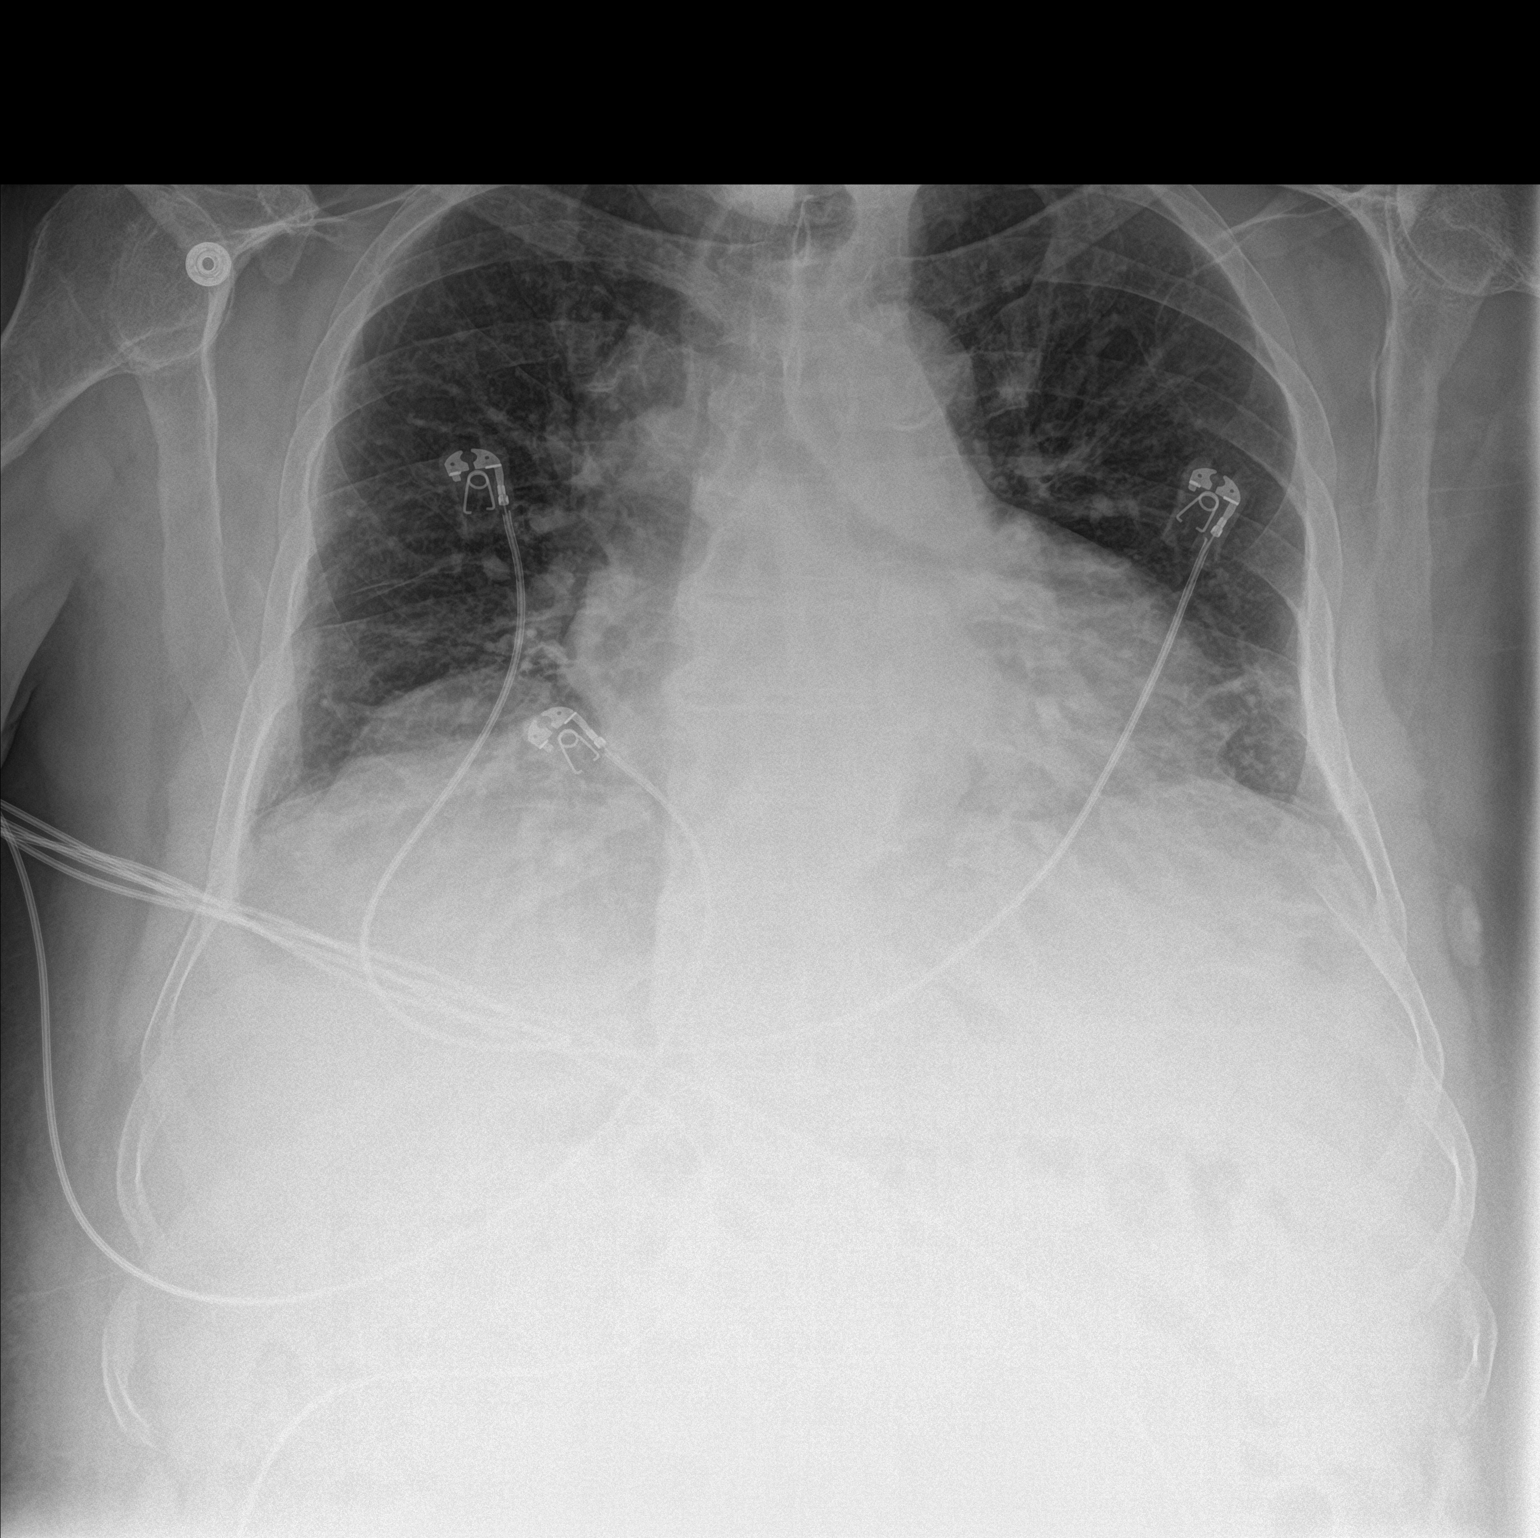

[chest lat]
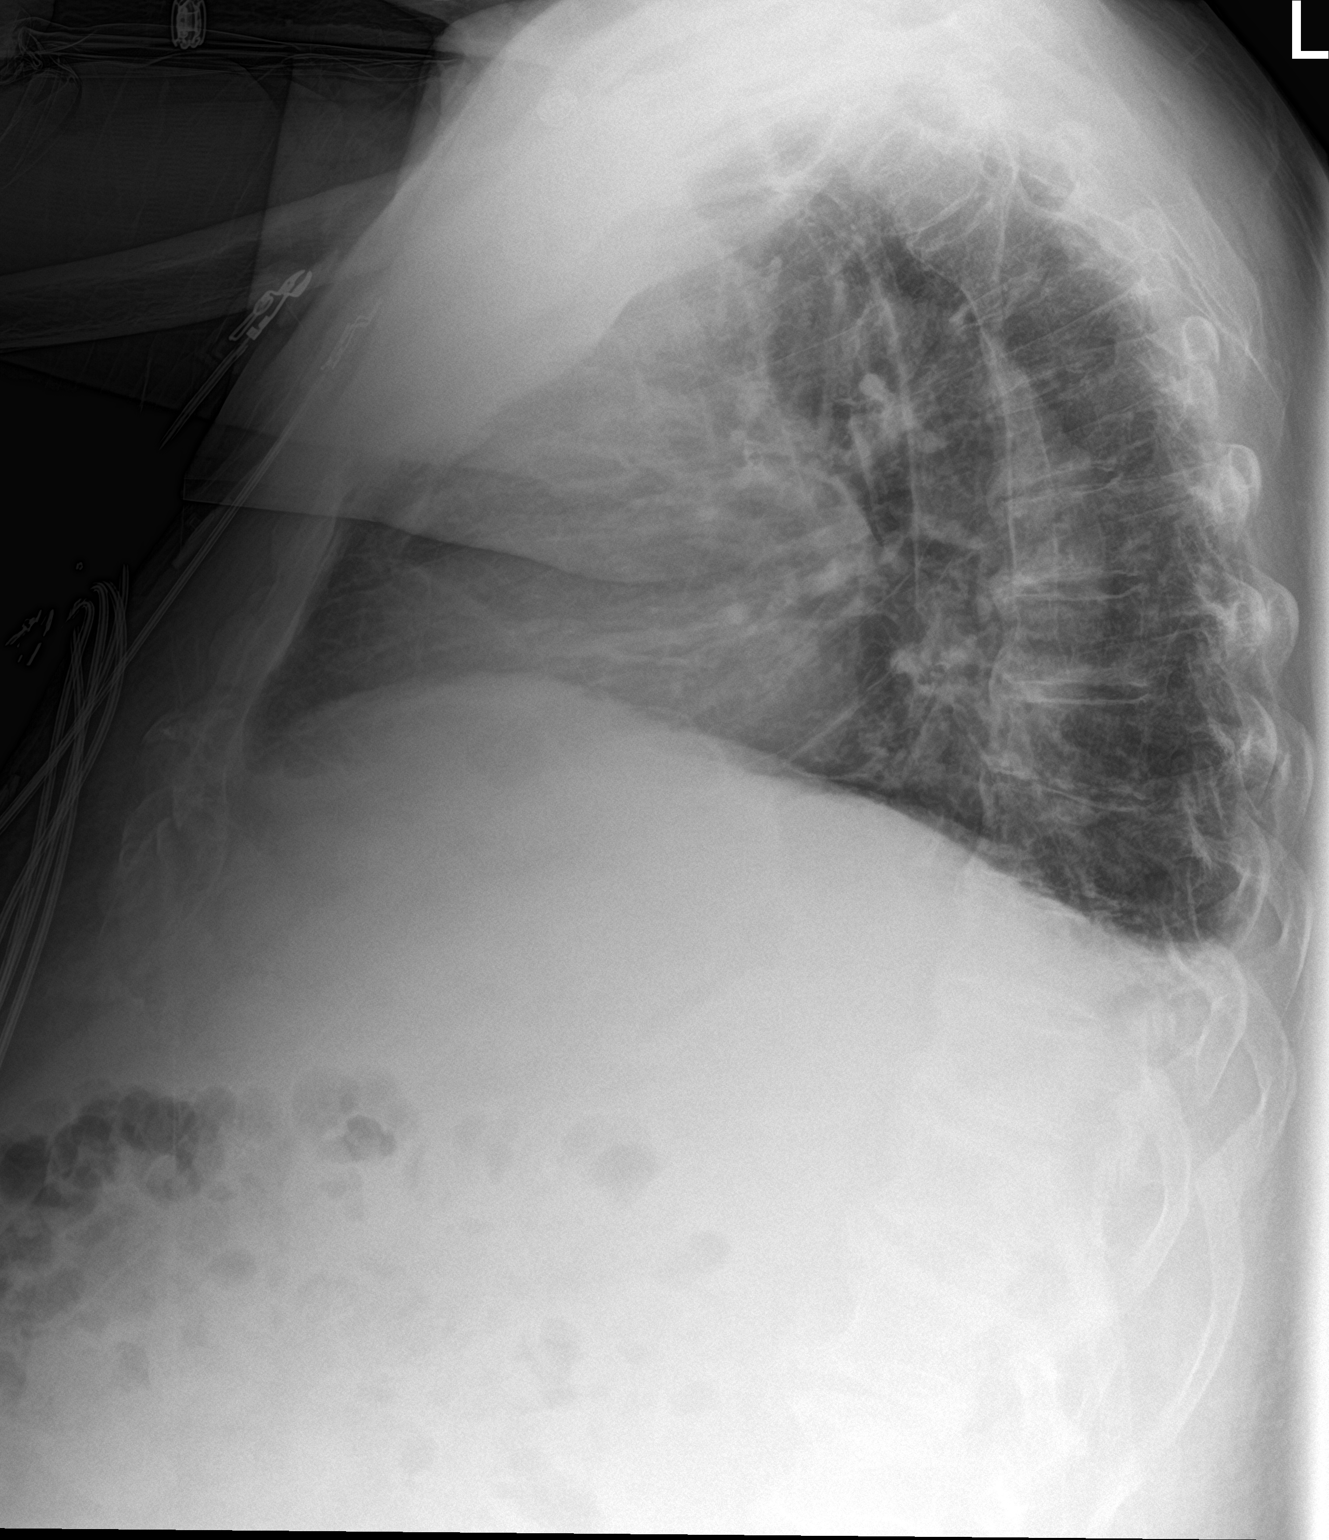

[2 of 2 positions shown; findings below may reference images not displayed]

FINDINGS: The lungs are mildly hypoinflated. The interstitial markings are
increased. The cardiac silhouette is enlarged. The central pulmonary
vascularity is prominent. There are small pleural effusions blunting
the costophrenic angles bilaterally. There is calcification in the
wall of the aortic arch. There is mild multilevel degenerative disc
disease of the thoracic spine.
IMPRESSION: Mild CHF.  Small bilateral pleural effusions.

Aortic atherosclerosis

## 2018-05-27 IMAGING — CT CT ABD-PELV W/ CM
2 of 6 series · 16 of 46 positions shown, 18 images · IV contrast (Omni 300)
Comparison: 03/09/2013

CLINICAL DATA: Anemia.  Cirrhosis and abdominal distention.

EXAM:
CT ABDOMEN AND PELVIS WITH CONTRAST
TECHNIQUE: Multidetector CT imaging of the abdomen and pelvis was performed
using the standard protocol following bolus administration of
intravenous contrast.
CONTRAST:  100mL [id number] IOPAMIDOL ([id number]) INJECTION 61%

[Series 2: a/p w/ 5mm · axial · 0.86mm/px · z∈[-416,+4]mm · 13 of 98 slices shown, 15 images]
[im 7/98  soft-tissue]
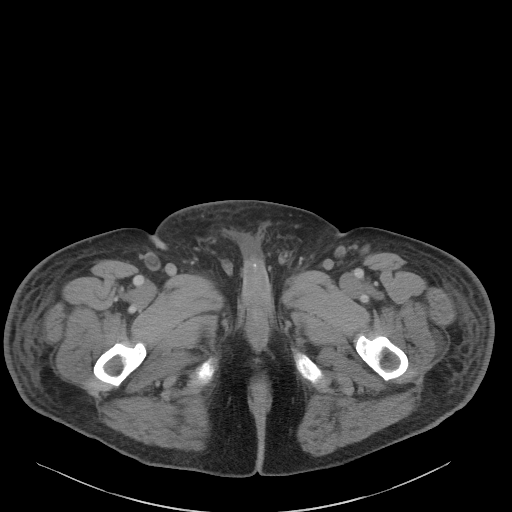
[im 7/98  bone]
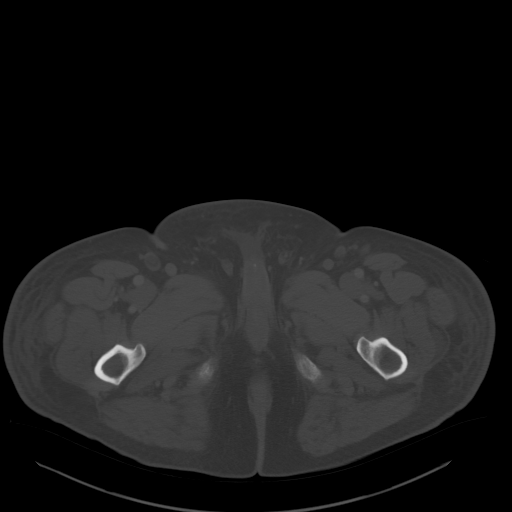
[im 13/98  soft-tissue]
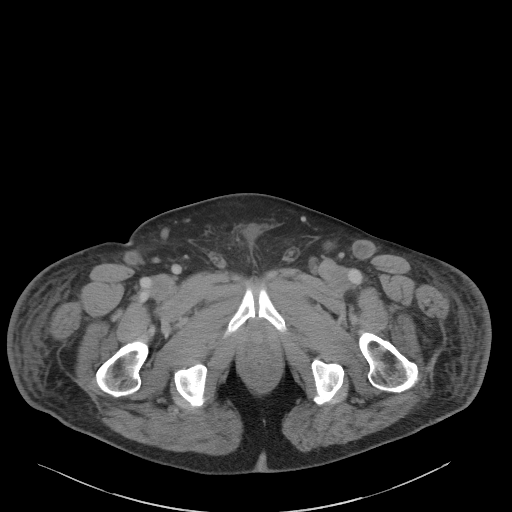
[im 19/98  soft-tissue]
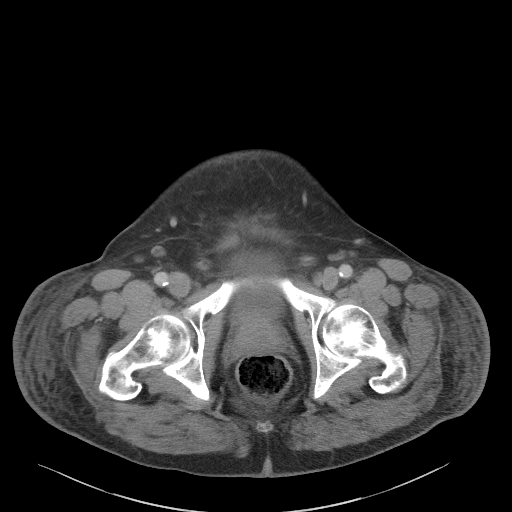
[im 31/98  soft-tissue]
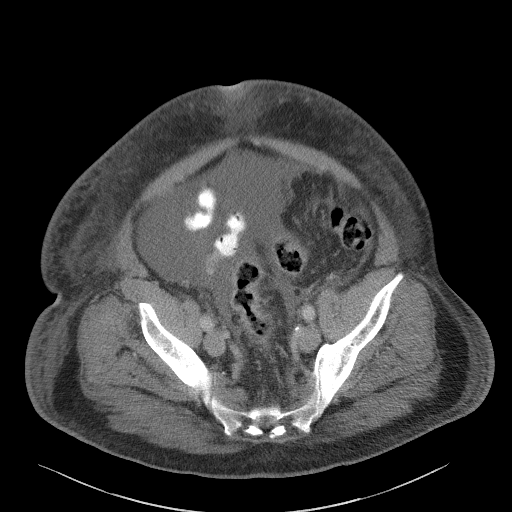
[im 37/98  soft-tissue]
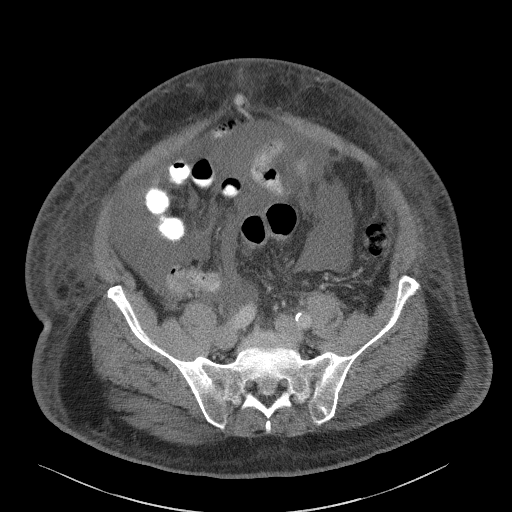
[im 43/98  soft-tissue]
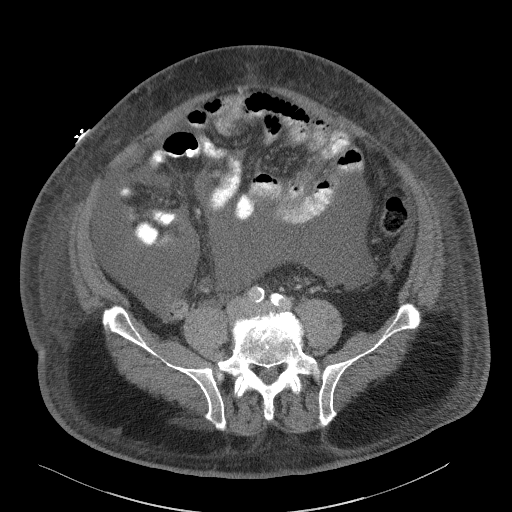
[im 49/98  soft-tissue]
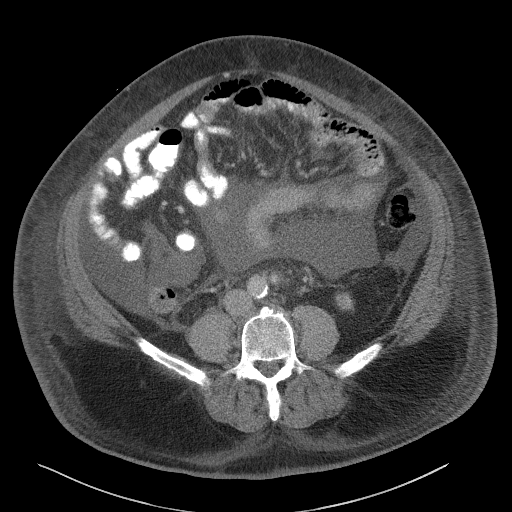
[im 55/98  soft-tissue]
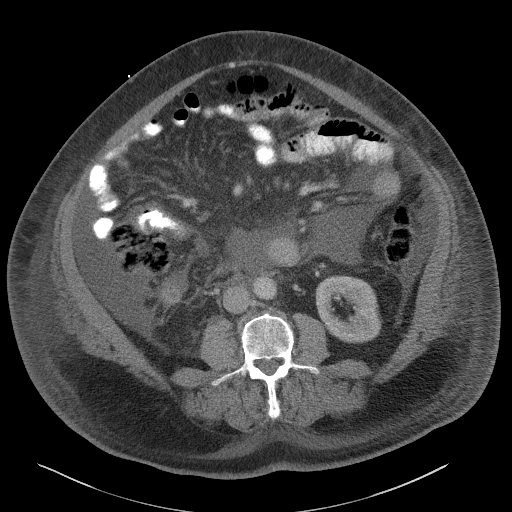
[im 61/98  soft-tissue]
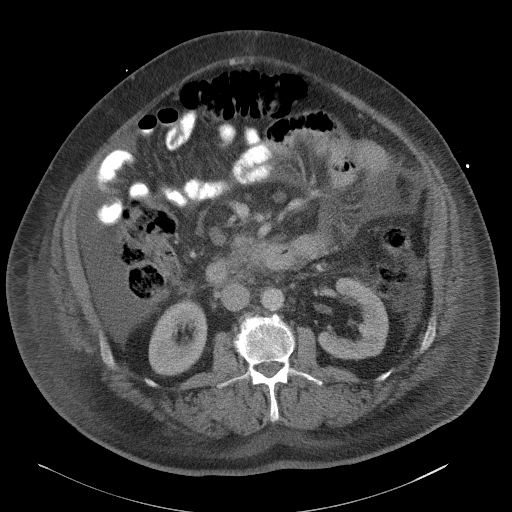
[im 61/98  bone]
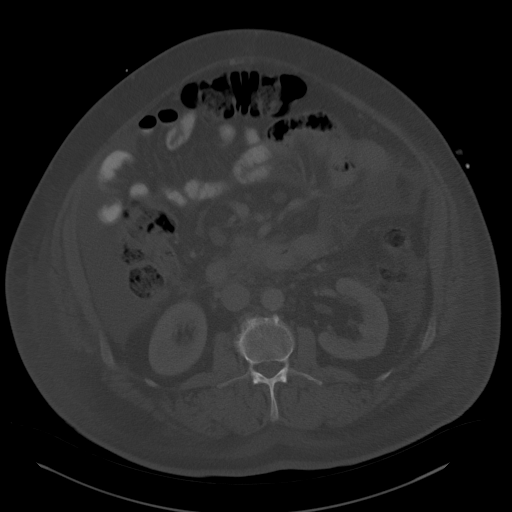
[im 67/98  soft-tissue]
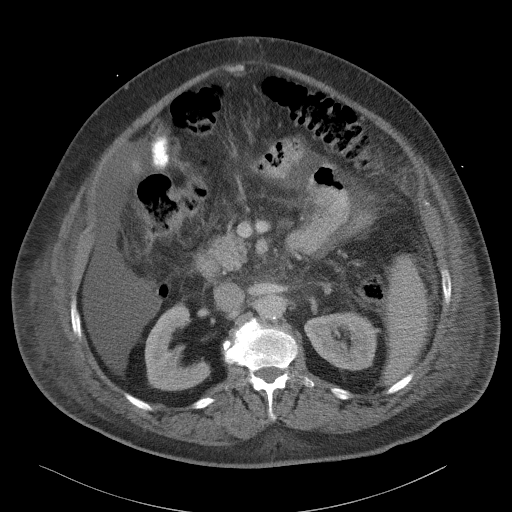
[im 79/98  soft-tissue]
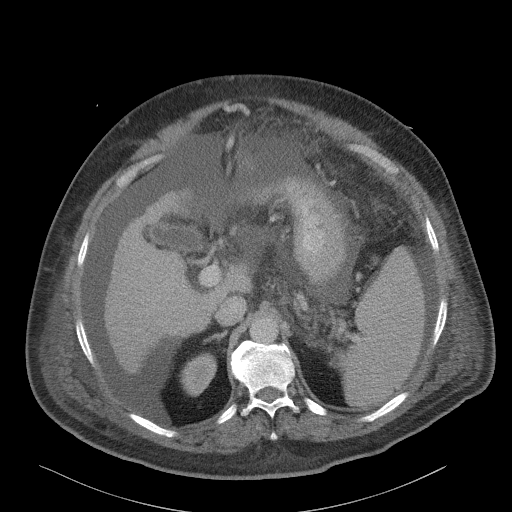
[im 85/98  soft-tissue]
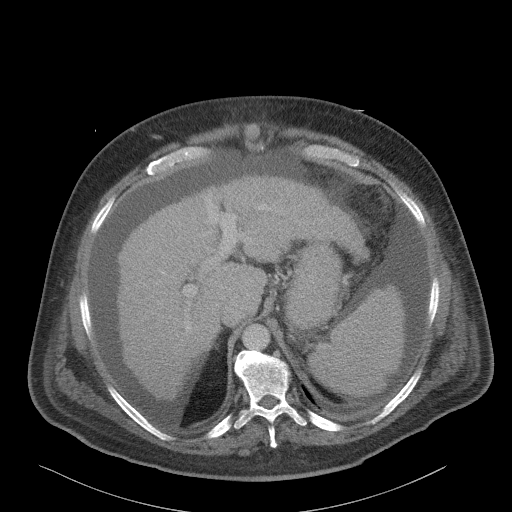
[im 91/98  soft-tissue]
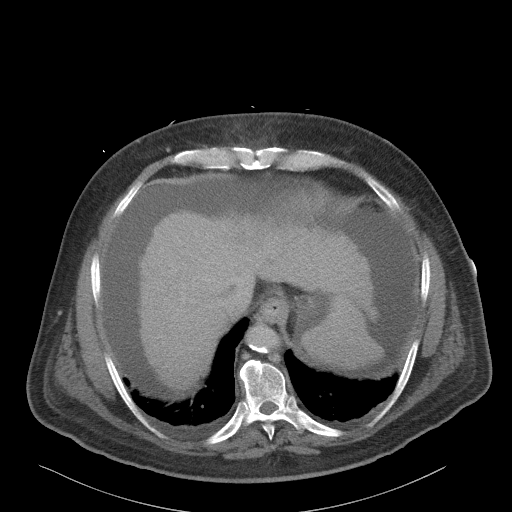

[Series 5: a/p w/ cor · coronal · 0.99mm/px · 3 of 185 slices shown]
[im 62/185  soft-tissue]
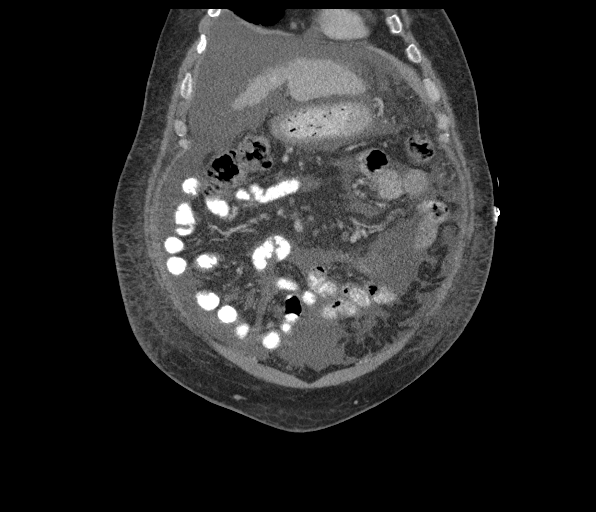
[im 82/185  soft-tissue]
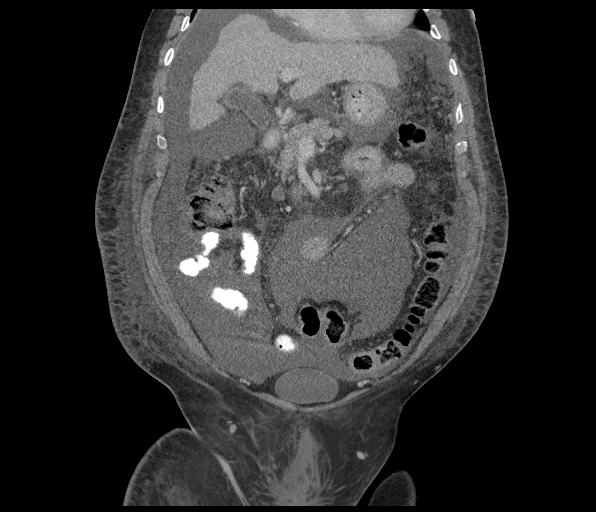
[im 103/185  soft-tissue]
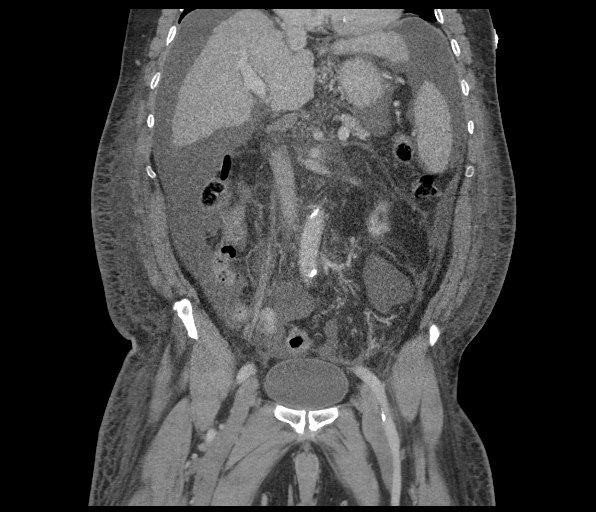

[16 of 46 positions shown; findings below may reference images not displayed]

FINDINGS: Lower chest: There are small bilateral pleural effusions identified.

Hepatobiliary: There is diffuse atrophy of the liver which has a
nodular contour or compatible with advanced cirrhosis. No suspicious
focal liver abnormality identified. Mild gallbladder wall edema. No
biliary dilatation.

Pancreas: No inflammation or mass identified.

Spleen: The spleen is enlarged with a length of 17 cm. No focal
splenic abnormality.

Adrenals/Urinary Tract: Normal appearance of the adrenal glands. The
kidneys both appear normal. Normal appearance of the urinary
bladder.

Stomach/Bowel: The stomach is normal. There is no pathologic
dilatation of the large or small bowel loops.

Vascular/Lymphatic: Calcified atherosclerotic disease involves the
abdominal aorta. No aneurysm. Periaortic lymph node measures 11 mm,
image number 18 of series 2. Portacaval lymph node is enlarged
measuring 1.4 cm. Multiple prominent mesenteric lymph nodes are also
identified. There is re- cannulization of the umbilical vein.
Multiple small gastric varices are identified.

Reproductive: No mass identified.

Other: Moderate to marked amount of abdominal ascites is identified.

Musculoskeletal: No aggressive lytic or sclerotic bone lesions
identified.
IMPRESSION: 1. Advanced changes of cirrhosis.
2. Stigmata of portal venous hypertension including ascites,
splenomegaly and varicosities.
3. Aortic atherosclerosis
4. Borderline enlarged upper abdominal lymph nodes may be
nonspecific in the setting of cirrhosis.
